# Patient Record
Sex: Male | Born: 1935 | Race: Black or African American | Hispanic: No | Smoking: Never smoker
Health system: Southern US, Community
[De-identification: ages and names within clinical notes are randomized; demographics above are authoritative.]

## PROBLEM LIST (undated history)

## (undated) DIAGNOSIS — D649 Anemia, unspecified: Secondary | ICD-10-CM

## (undated) DIAGNOSIS — I10 Essential (primary) hypertension: Secondary | ICD-10-CM

## (undated) DIAGNOSIS — N189 Chronic kidney disease, unspecified: Secondary | ICD-10-CM

## (undated) DIAGNOSIS — G4730 Sleep apnea, unspecified: Secondary | ICD-10-CM

## (undated) DIAGNOSIS — E119 Type 2 diabetes mellitus without complications: Secondary | ICD-10-CM

## (undated) DIAGNOSIS — F0390 Unspecified dementia without behavioral disturbance: Secondary | ICD-10-CM

## (undated) DIAGNOSIS — E785 Hyperlipidemia, unspecified: Secondary | ICD-10-CM

## (undated) DIAGNOSIS — J449 Chronic obstructive pulmonary disease, unspecified: Secondary | ICD-10-CM

---

## 2017-11-30 ENCOUNTER — Other Ambulatory Visit: Payer: Self-pay | Admitting: Internal Medicine

## 2017-11-30 ENCOUNTER — Ambulatory Visit
Admission: RE | Admit: 2017-11-30 | Discharge: 2017-11-30 | Disposition: A | Discharge status: Home / Self Care | Payer: Self-pay | Source: Ambulatory Visit | Attending: Internal Medicine | Admitting: Internal Medicine

## 2017-11-30 DIAGNOSIS — R059 Cough, unspecified: Secondary | ICD-10-CM

## 2017-11-30 DIAGNOSIS — R05 Cough: Secondary | ICD-10-CM

## 2017-12-06 ENCOUNTER — Encounter (HOSPITAL_BASED_OUTPATIENT_CLINIC_OR_DEPARTMENT_OTHER): Payer: Self-pay

## 2017-12-06 DIAGNOSIS — R29818 Other symptoms and signs involving the nervous system: Secondary | ICD-10-CM

## 2017-12-20 ENCOUNTER — Encounter (HOSPITAL_BASED_OUTPATIENT_CLINIC_OR_DEPARTMENT_OTHER): Payer: No Typology Code available for payment source

## 2018-01-17 ENCOUNTER — Encounter (HOSPITAL_BASED_OUTPATIENT_CLINIC_OR_DEPARTMENT_OTHER): Payer: No Typology Code available for payment source

## 2019-09-17 ENCOUNTER — Other Ambulatory Visit: Payer: Self-pay | Admitting: Internal Medicine

## 2019-09-17 ENCOUNTER — Ambulatory Visit
Admission: RE | Admit: 2019-09-17 | Discharge: 2019-09-17 | Disposition: A | Discharge status: Home / Self Care | Payer: No Typology Code available for payment source | Source: Ambulatory Visit | Attending: Internal Medicine | Admitting: Internal Medicine

## 2019-09-17 DIAGNOSIS — R52 Pain, unspecified: Secondary | ICD-10-CM

## 2019-09-18 ENCOUNTER — Encounter (HOSPITAL_BASED_OUTPATIENT_CLINIC_OR_DEPARTMENT_OTHER): Payer: Self-pay

## 2019-09-18 ENCOUNTER — Emergency Department (HOSPITAL_BASED_OUTPATIENT_CLINIC_OR_DEPARTMENT_OTHER): Payer: Medicare (Managed Care)

## 2019-09-18 ENCOUNTER — Emergency Department (HOSPITAL_BASED_OUTPATIENT_CLINIC_OR_DEPARTMENT_OTHER)
Admission: EM | Admit: 2019-09-18 | Discharge: 2019-09-18 | Disposition: A | Discharge status: Home / Self Care | Payer: Medicare (Managed Care) | Attending: Emergency Medicine | Admitting: Emergency Medicine

## 2019-09-18 ENCOUNTER — Other Ambulatory Visit: Payer: Self-pay

## 2019-09-18 DIAGNOSIS — S8991XA Unspecified injury of right lower leg, initial encounter: Secondary | ICD-10-CM | POA: Insufficient documentation

## 2019-09-18 DIAGNOSIS — N189 Chronic kidney disease, unspecified: Secondary | ICD-10-CM | POA: Insufficient documentation

## 2019-09-18 DIAGNOSIS — Y999 Unspecified external cause status: Secondary | ICD-10-CM | POA: Diagnosis not present

## 2019-09-18 DIAGNOSIS — I129 Hypertensive chronic kidney disease with stage 1 through stage 4 chronic kidney disease, or unspecified chronic kidney disease: Secondary | ICD-10-CM | POA: Diagnosis not present

## 2019-09-18 DIAGNOSIS — F0390 Unspecified dementia without behavioral disturbance: Secondary | ICD-10-CM | POA: Insufficient documentation

## 2019-09-18 DIAGNOSIS — Z7982 Long term (current) use of aspirin: Secondary | ICD-10-CM | POA: Diagnosis not present

## 2019-09-18 DIAGNOSIS — J449 Chronic obstructive pulmonary disease, unspecified: Secondary | ICD-10-CM | POA: Insufficient documentation

## 2019-09-18 DIAGNOSIS — Y929 Unspecified place or not applicable: Secondary | ICD-10-CM | POA: Insufficient documentation

## 2019-09-18 DIAGNOSIS — W19XXXA Unspecified fall, initial encounter: Secondary | ICD-10-CM | POA: Diagnosis not present

## 2019-09-18 DIAGNOSIS — Y939 Activity, unspecified: Secondary | ICD-10-CM | POA: Insufficient documentation

## 2019-09-18 DIAGNOSIS — Z79899 Other long term (current) drug therapy: Secondary | ICD-10-CM | POA: Insufficient documentation

## 2019-09-18 HISTORY — DX: Chronic kidney disease, unspecified: N18.9

## 2019-09-18 HISTORY — DX: Unspecified dementia, unspecified severity, without behavioral disturbance, psychotic disturbance, mood disturbance, and anxiety: F03.90

## 2019-09-18 HISTORY — DX: Essential (primary) hypertension: I10

## 2019-09-18 HISTORY — DX: Chronic obstructive pulmonary disease, unspecified: J44.9

## 2019-09-18 NOTE — ED Provider Notes (Signed)
[facility] EMERGENCY DEPARTMENT Provider Note   CSN: [id number] Arrival date & time: 09/18/19  1457     History   Chief Complaint Chief Complaint  Patient presents with  . Knee Pain    HPI Jason Vasquez is a 85 y.o. male  w PMHx HTN, CKD, COPD, dementia, presenting to the ED with complaint of persistent right knee pain after mechanical fall that occurred on 09/15/2019.  He reportedly slipped on some the outside and fell forward onto his knees and struck his head.  He was seen at [facility] ED with CT imaging of the head and C-spine as well as plain films of the right knee which were all negative.  He was discharged with symptomatic management.  His family states he was at physical therapy for his knee pain, however is concerned he may have a patella dislocation.  Patient states he has been having persistent pain to the right knee.  Pain is worse with fully extending his knee and he is unable to do so due to pain.  Has been treating his symptoms with Biofreeze, Tylenol without much relief.  His family states he is remained at his mental baseline since the fall, he is not on anticoagulation.     The history is provided by the patient, medical records and a relative.    Past Medical History:  Diagnosis Date  . Hypertension     There are no active problems to display for this patient.   History reviewed. No pertinent surgical history.      Home Medications    Prior to Admission medications   Medication Sig Start Date End Date Taking? Authorizing Provider  atorvastatin (LIPITOR) 10 MG tablet Take by mouth. 10/16/16  Yes [provider]  ezetimibe (ZETIA) 10 MG tablet Take by mouth. 07/06/17  Yes [provider]  omeprazole (PRILOSEC) 40 MG capsule Take by mouth. 07/06/17  Yes [provider]  acetaminophen (TYLENOL) 650 MG CR tablet Take by mouth.    [provider]  aspirin EC 81 MG tablet Take by mouth.    [provider]    Family History No family history on file.  Social History Social History   Tobacco Use  . Smoking status: Never Smoker  Substance Use Topics  . Alcohol use: Never    Frequency: Never  . Drug use: Never     Allergies   Patient has no allergy information on record.   Review of Systems Review of Systems  All other systems reviewed and are negative.    Physical Exam Updated Vital Signs BP 133/87 (BP Location: Right Arm)   Pulse 87   Temp 98.3 F (36.8 C) (Oral)   Resp 17   Ht 5\' 11"  (1.803 m)   Wt 102.1 kg   SpO2 99%   BMI 31.38 kg/m   Physical Exam Vitals signs and nursing note reviewed.  Constitutional:      General: He is not in acute distress.    Appearance: He is well-developed.  HENT:     Head: Normocephalic and atraumatic.  Eyes:     Conjunctiva/sclera: Conjunctivae normal.  Cardiovascular:     Rate and Rhythm: Normal rate.  Pulmonary:     Effort: Pulmonary effort is normal.  Abdominal:     Palpations: Abdomen is soft.  Musculoskeletal:     Comments: Patient holding right knee in a flexed position, unwilling to fully extend the knee due to pain.  His knee begins  to extend, there appears to be some mild deformity just superior to the patella. Patient has generalized tenderness to the knee with associated swelling and mild warmth, no redness.  Skin:    General: Skin is warm.  Neurological:     Mental Status: He is alert.  Psychiatric:        Behavior: Behavior normal.      ED Treatments / Results  Labs (all labs ordered are listed, but only abnormal results are displayed) Labs Reviewed - No data to display  EKG None  Radiology Dg Knee 4 Views W/patella Right  Result Date: 09/18/2019 CLINICAL DATA:  Acute RIGHT knee pain following fall today. Initial encounter. EXAM: RIGHT KNEE - COMPLETE 4+ VIEW COMPARISON:  09/15/2019 FINDINGS: No acute fracture, subluxation or dislocation. No definite knee effusion is noted. Probable soft tissue  swelling noted. Mild tricompartmental joint space narrowing noted. IMPRESSION: Probable soft tissue swelling without acute bony abnormality. Electronically Signed   By: Margarette Canada M.D.   On: 09/18/2019 16:50    Procedures Procedures (including critical care time)  Medications Ordered in ED Medications - No data to display   Initial Impression / Assessment and Plan / ED Course  I have reviewed the triage vital signs and the nursing notes.  Pertinent labs & imaging results that were available during my care of the patient were reviewed by me and considered in my medical decision making (see chart for details).        Patient presenting with persistent right knee pain after mechanical fall on 11 2.  He was evaluated at [facility] ED with negative imaging of the knee.  He has had persistent pain with swelling.  Pain is worse with fully extending the knee.  He was at his physical therapist today who sent him here for further evaluation with concern for possible patella dislocation.  No fevers. On exam, knee is edematous and diffusely tender.  Patient is having more pain with extension of the knee.  There is a mild deformity just superior to the patella.  Neurovascularly intact.  No redness.Low suspicion for septic or gouty arthritis given acute onset of sx following injury. Xray here is neg for acute fracture or dislocation.  Patient evaluated by Dr. Sedonia Small.  Given the mild deformity to superior to patella, concern for possible quadriceps tendon rupture versus internal derangement.  Will place in knee immobilizer brace.  Patient has a walker at home, instructed to use this for ambulation.  He is provided outpatient orthopedic referral for further management.  Patient's family is agreeable to plan, patient is a for discharge.  Discussed results, findings, treatment and follow up. Patient advised of return precautions. Patient verbalized understanding and agreed with plan.  Final Clinical  Impressions(s) / ED Diagnoses   Final diagnoses:  Injury of right knee, initial encounter    ED Discharge Orders    None       , [place] N, PA-C 09/18/19 1730    Maudie Flakes, MD 09/18/19 2321

## 2019-09-18 NOTE — ED Triage Notes (Signed)
Pt presents with pain to R knee after fall- seen at [facility] for same on 11/2. Pt in wheelchair- NAD.

## 2019-09-18 NOTE — Discharge Instructions (Addendum)
Please read instructions below. Apply ice to your knee for 20 minutes at a time. You can take Tylenol every 6 hours as needed for pain. Schedule an appointment with the orthopedic specialist for further evaluation of your injury. Return to the ER for new or concerning symptoms.

## 2019-09-18 NOTE — ED Notes (Signed)
ED Provider at bedside. 

## 2019-09-18 NOTE — ED Notes (Signed)
Pt back from x-ray.

## 2019-09-23 ENCOUNTER — Inpatient Hospital Stay (HOSPITAL_COMMUNITY)
Admission: AD | Admit: 2019-09-23 | Discharge: 2019-10-04 | DRG: 500 | Disposition: A | Discharge status: Skilled Nursing Facility | Payer: Medicare (Managed Care) | Source: Ambulatory Visit | Attending: Internal Medicine | Admitting: Internal Medicine

## 2019-09-23 ENCOUNTER — Inpatient Hospital Stay (HOSPITAL_COMMUNITY): Payer: Medicare (Managed Care)

## 2019-09-23 ENCOUNTER — Encounter (HOSPITAL_COMMUNITY): Payer: Self-pay

## 2019-09-23 DIAGNOSIS — N1831 Chronic kidney disease, stage 3a: Secondary | ICD-10-CM | POA: Diagnosis present

## 2019-09-23 DIAGNOSIS — S76111A Strain of right quadriceps muscle, fascia and tendon, initial encounter: Secondary | ICD-10-CM | POA: Diagnosis present

## 2019-09-23 DIAGNOSIS — L8990 Pressure ulcer of unspecified site, unspecified stage: Secondary | ICD-10-CM | POA: Insufficient documentation

## 2019-09-23 DIAGNOSIS — R410 Disorientation, unspecified: Secondary | ICD-10-CM

## 2019-09-23 DIAGNOSIS — E785 Hyperlipidemia, unspecified: Secondary | ICD-10-CM | POA: Diagnosis present

## 2019-09-23 DIAGNOSIS — Z79899 Other long term (current) drug therapy: Secondary | ICD-10-CM

## 2019-09-23 DIAGNOSIS — G309 Alzheimer's disease, unspecified: Secondary | ICD-10-CM | POA: Diagnosis present

## 2019-09-23 DIAGNOSIS — I129 Hypertensive chronic kidney disease with stage 1 through stage 4 chronic kidney disease, or unspecified chronic kidney disease: Secondary | ICD-10-CM | POA: Diagnosis present

## 2019-09-23 DIAGNOSIS — N400 Enlarged prostate without lower urinary tract symptoms: Secondary | ICD-10-CM

## 2019-09-23 DIAGNOSIS — N179 Acute kidney failure, unspecified: Secondary | ICD-10-CM | POA: Diagnosis present

## 2019-09-23 DIAGNOSIS — Z781 Physical restraint status: Secondary | ICD-10-CM | POA: Diagnosis not present

## 2019-09-23 DIAGNOSIS — E1169 Type 2 diabetes mellitus with other specified complication: Secondary | ICD-10-CM | POA: Diagnosis present

## 2019-09-23 DIAGNOSIS — G9341 Metabolic encephalopathy: Secondary | ICD-10-CM | POA: Diagnosis present

## 2019-09-23 DIAGNOSIS — E119 Type 2 diabetes mellitus without complications: Secondary | ICD-10-CM | POA: Diagnosis not present

## 2019-09-23 DIAGNOSIS — J1289 Other viral pneumonia: Secondary | ICD-10-CM | POA: Diagnosis present

## 2019-09-23 DIAGNOSIS — Z6830 Body mass index (BMI) 30.0-30.9, adult: Secondary | ICD-10-CM

## 2019-09-23 DIAGNOSIS — Z87891 Personal history of nicotine dependence: Secondary | ICD-10-CM | POA: Diagnosis not present

## 2019-09-23 DIAGNOSIS — E1122 Type 2 diabetes mellitus with diabetic chronic kidney disease: Secondary | ICD-10-CM | POA: Diagnosis present

## 2019-09-23 DIAGNOSIS — K219 Gastro-esophageal reflux disease without esophagitis: Secondary | ICD-10-CM | POA: Diagnosis present

## 2019-09-23 DIAGNOSIS — M7989 Other specified soft tissue disorders: Secondary | ICD-10-CM | POA: Diagnosis not present

## 2019-09-23 DIAGNOSIS — F05 Delirium due to known physiological condition: Secondary | ICD-10-CM | POA: Diagnosis present

## 2019-09-23 DIAGNOSIS — U071 COVID-19: Secondary | ICD-10-CM | POA: Diagnosis present

## 2019-09-23 DIAGNOSIS — L89322 Pressure ulcer of left buttock, stage 2: Secondary | ICD-10-CM | POA: Diagnosis present

## 2019-09-23 DIAGNOSIS — I160 Hypertensive urgency: Secondary | ICD-10-CM | POA: Diagnosis not present

## 2019-09-23 DIAGNOSIS — E669 Obesity, unspecified: Secondary | ICD-10-CM | POA: Diagnosis present

## 2019-09-23 DIAGNOSIS — W010XXA Fall on same level from slipping, tripping and stumbling without subsequent striking against object, initial encounter: Secondary | ICD-10-CM | POA: Diagnosis present

## 2019-09-23 DIAGNOSIS — N183 Chronic kidney disease, stage 3 unspecified: Secondary | ICD-10-CM

## 2019-09-23 DIAGNOSIS — Z419 Encounter for procedure for purposes other than remedying health state, unspecified: Secondary | ICD-10-CM

## 2019-09-23 DIAGNOSIS — J9601 Acute respiratory failure with hypoxia: Secondary | ICD-10-CM | POA: Diagnosis present

## 2019-09-23 DIAGNOSIS — F0280 Dementia in other diseases classified elsewhere without behavioral disturbance: Secondary | ICD-10-CM | POA: Diagnosis present

## 2019-09-23 LAB — BASIC METABOLIC PANEL
Anion gap: 9 (ref 5–15)
BUN: 34 mg/dL — ABNORMAL HIGH (ref 8–23)
CO2: 29 mmol/L (ref 22–32)
Calcium: 8.7 mg/dL — ABNORMAL LOW (ref 8.9–10.3)
Chloride: 98 mmol/L (ref 98–111)
Creatinine, Ser: 1.82 mg/dL — ABNORMAL HIGH (ref 0.61–1.24)
GFR calc Af Amer: 38 mL/min — ABNORMAL LOW (ref >60)
GFR calc non Af Amer: 32 mL/min — ABNORMAL LOW (ref >60)
Glucose, Bld: 130 mg/dL — ABNORMAL HIGH (ref 70–99)
Potassium: 3.2 mmol/L — ABNORMAL LOW (ref 3.5–5.1)
Sodium: 136 mmol/L (ref 135–145)

## 2019-09-23 LAB — CBC WITH DIFFERENTIAL/PLATELET
Abs Immature Granulocytes: 0.01 10*3/uL (ref 0.00–0.07)
Basophils Absolute: 0 10*3/uL (ref 0.0–0.1)
Basophils Relative: 0 %
Eosinophils Absolute: 0.1 10*3/uL (ref 0.0–0.5)
Eosinophils Relative: 4 %
HCT: 36 % — ABNORMAL LOW (ref 39.0–52.0)
Hemoglobin: 12 g/dL — ABNORMAL LOW (ref 13.0–17.0)
Immature Granulocytes: 0 %
Lymphocytes Relative: 26 %
Lymphs Abs: 1 10*3/uL (ref 0.7–4.0)
MCH: 33.8 pg (ref 26.0–34.0)
MCHC: 33.3 g/dL (ref 30.0–36.0)
MCV: 101.4 fL — ABNORMAL HIGH (ref 80.0–100.0)
Monocytes Absolute: 0.7 10*3/uL (ref 0.1–1.0)
Monocytes Relative: 19 %
Neutro Abs: 2.1 10*3/uL (ref 1.7–7.7)
Neutrophils Relative %: 51 %
Platelets: 292 10*3/uL (ref 150–400)
RBC: 3.55 MIL/uL — ABNORMAL LOW (ref 4.22–5.81)
RDW: 12.3 % (ref 11.5–15.5)
WBC: 4 10*3/uL (ref 4.0–10.5)
nRBC: 0 % (ref 0.0–0.2)

## 2019-09-23 LAB — PROTIME-INR
INR: 1 (ref 0.8–1.2)
Prothrombin Time: 13.5 seconds (ref 11.4–15.2)

## 2019-09-23 MED ORDER — HYDROCODONE-ACETAMINOPHEN 5-325 MG PO TABS
1.0000 | ORAL_TABLET | ORAL | Status: DC | PRN
  Administered 2019-09-24: 1 via ORAL
  Administered 2019-09-25 – 2019-10-04 (×6): 2 via ORAL
  Filled 2019-09-23 (×2): qty 1
  Filled 2019-09-23 (×6): qty 2

## 2019-09-23 MED ORDER — SODIUM CHLORIDE 0.9% FLUSH
10.0000 mL | Freq: Two times a day (BID) | INTRAVENOUS | Status: DC
  Administered 2019-09-24 – 2019-10-02 (×17): 10 mL
  Administered 2019-10-03: 3 mL

## 2019-09-23 MED ORDER — SODIUM CHLORIDE 0.9 % IV SOLN
INTRAVENOUS | Status: DC
  Administered 2019-09-24: 06:00:00 via INTRAVENOUS

## 2019-09-23 MED ORDER — METHOCARBAMOL 500 MG PO TABS
500.0000 mg | ORAL_TABLET | Freq: Four times a day (QID) | ORAL | Status: DC | PRN
  Administered 2019-09-24 – 2019-09-30 (×3): 500 mg via ORAL
  Filled 2019-09-23 (×3): qty 1

## 2019-09-23 MED ORDER — ENOXAPARIN SODIUM 30 MG/0.3ML ~~LOC~~ SOLN: 30.0000 mg | SUBCUTANEOUS | Status: DC

## 2019-09-23 MED ORDER — SODIUM CHLORIDE 0.9% FLUSH: 10.0000 mL | INTRAVENOUS | Status: DC | PRN

## 2019-09-23 NOTE — H&P (Signed)
Anticipated LOS equal to or greater than 2 midnights due to - Age 83 and older with one or more of the following:  - Obesity  - Expected need for hospital services (PT, OT, Nursing) required for safe  discharge  - Anticipated need for postoperative skilled nursing care or inpatient rehab  - Active co-morbidities: COPD OR   - Unanticipated findings during/Post Surgery: None  - Patient is a high risk of re-admission due to: None    Jason Vasquez is an 85 y.o. male.    Chief Complaint: right knee pain s/p fall  HPI: Pt is a 85 y.o. male complaining of right knee pain due to recent fall. Pain had continually increased since the beginning. X-rays in the clinic show patella alta signifying possible quad tendon rupture .  Various options are discussed with the patient. Risks, benefits and expectations were discussed with the patient. Patient understand the risks, benefits and expectations and wishes to proceed with surgery.   PCP:  Angelica Pou, MD  D/C Plans: Home  PMH: Past Medical History:  Diagnosis Date  . CKD (chronic kidney disease)   . COPD (chronic obstructive pulmonary disease) ([place])   . Dementia ([place])   . Hypertension     PSH: No past surgical history on file.  Social History:  reports that he has never smoked. He does not have any smokeless tobacco history on file. He reports that he does not drink alcohol or use drugs.  Allergies:  Not on File  Medications: Current Facility-Administered Medications  Medication Dose Route Frequency Provider Last Rate Last Dose  . 0.9 %  sodium chloride infusion   Intravenous Continuous Cline Crock, PA-C      . enoxaparin (LOVENOX) injection 30 mg  30 mg Subcutaneous Q24H Cline Crock, PA-C      . HYDROcodone-acetaminophen (NORCO/VICODIN) 5-325 MG per tablet 1-2 tablet  1-2 tablet Oral Q4H PRN Cline Crock, PA-C      . methocarbamol (ROBAXIN) tablet 500 mg  500 mg Oral Q6H PRN Cline Crock,  PA-C        No results found for this or any previous visit (from the past 48 hour(s)). No results found.  ROS: Pain with rom of the right lower extremity  Physical Exam: Alert and oriented 85 y.o. male in no acute distress Cranial nerves 2-12 intact Cervical spine: full rom with no tenderness, nv intact distally Chest: active breath sounds bilaterally, no wheeze rhonchi or rales Heart: regular rate and rhythm, no murmur Abd: non tender non distended with active bowel sounds Hip is stable with rom  Right knee with limited rom and extensor lag Palpable defect to right distal quad tendon Antalgic gait  Assessment/Plan Assessment: right quad tendon rupture  Plan:  Patient will undergo a right quad tendon repair by Dr. Veverly Fells at [facility]. Risks benefits and expectations were discussed with the patient. Patient understand risks, benefits and expectations and wishes to proceed. Preoperative templating of the joint replacement has been completed, documented, and submitted to the Operating Room personnel in order to optimize intra-operative equipment management.   Merla Riches PA-C, MPAS [facility] is now [employer] [address], [place], [place], [place] [address] Phone: [phone] [email or web address] Facebook  [employer]

## 2019-09-24 ENCOUNTER — Other Ambulatory Visit: Payer: Self-pay

## 2019-09-24 ENCOUNTER — Inpatient Hospital Stay (HOSPITAL_COMMUNITY): Payer: Medicare (Managed Care) | Admitting: Certified Registered Nurse Anesthetist

## 2019-09-24 ENCOUNTER — Encounter (HOSPITAL_COMMUNITY)
Admission: AD | Disposition: A | Discharge status: Skilled Nursing Facility | Payer: Self-pay | Source: Ambulatory Visit | Attending: Internal Medicine

## 2019-09-24 ENCOUNTER — Inpatient Hospital Stay (HOSPITAL_COMMUNITY): Payer: Medicare (Managed Care)

## 2019-09-24 DIAGNOSIS — S76111A Strain of right quadriceps muscle, fascia and tendon, initial encounter: Secondary | ICD-10-CM | POA: Diagnosis present

## 2019-09-24 DIAGNOSIS — M7989 Other specified soft tissue disorders: Secondary | ICD-10-CM

## 2019-09-24 HISTORY — PX: QUADRICEPS TENDON REPAIR: SHX756

## 2019-09-24 LAB — BASIC METABOLIC PANEL
Anion gap: 9 (ref 5–15)
BUN: 26 mg/dL — ABNORMAL HIGH (ref 8–23)
CO2: 26 mmol/L (ref 22–32)
Calcium: 8.7 mg/dL — ABNORMAL LOW (ref 8.9–10.3)
Chloride: 100 mmol/L (ref 98–111)
Creatinine, Ser: 1.37 mg/dL — ABNORMAL HIGH (ref 0.61–1.24)
GFR calc Af Amer: 53 mL/min — ABNORMAL LOW (ref >60)
GFR calc non Af Amer: 46 mL/min — ABNORMAL LOW (ref >60)
Glucose, Bld: 108 mg/dL — ABNORMAL HIGH (ref 70–99)
Potassium: 3.1 mmol/L — ABNORMAL LOW (ref 3.5–5.1)
Sodium: 135 mmol/L (ref 135–145)

## 2019-09-24 LAB — SURGICAL PCR SCREEN
MRSA, PCR: NEGATIVE
Staphylococcus aureus: NEGATIVE

## 2019-09-24 LAB — SARS CORONAVIRUS 2 (TAT 6-24 HRS): SARS Coronavirus 2: POSITIVE — AB

## 2019-09-24 SURGERY — REPAIR, TENDON, QUADRICEPS
Anesthesia: Spinal | Site: Knee | Laterality: Right

## 2019-09-24 MED ORDER — DILTIAZEM HCL ER COATED BEADS 420 MG PO TB24: 420.0000 mg | ORAL_TABLET | Freq: Every day | ORAL | Status: DC

## 2019-09-24 MED ORDER — FLUTICASONE PROPIONATE 50 MCG/ACT NA SUSP
1.0000 | Freq: Every day | NASAL | Status: DC
  Administered 2019-09-25 – 2019-10-03 (×9): 1 via NASAL
  Filled 2019-09-24: qty 16

## 2019-09-24 MED ORDER — PHENYLEPHRINE 40 MCG/ML (10ML) SYRINGE FOR IV PUSH (FOR BLOOD PRESSURE SUPPORT)
PREFILLED_SYRINGE | INTRAVENOUS | Status: DC | PRN
  Administered 2019-09-24 (×3): 120 ug via INTRAVENOUS

## 2019-09-24 MED ORDER — ONDANSETRON HCL 4 MG/2ML IJ SOLN
INTRAMUSCULAR | Status: DC | PRN
  Administered 2019-09-24: 4 mg via INTRAVENOUS

## 2019-09-24 MED ORDER — ROCURONIUM BROMIDE 50 MG/5ML IV SOSY
PREFILLED_SYRINGE | INTRAVENOUS | Status: DC | PRN
  Administered 2019-09-24: 50 mg via INTRAVENOUS

## 2019-09-24 MED ORDER — DOCUSATE SODIUM 100 MG PO CAPS
100.0000 mg | ORAL_CAPSULE | Freq: Two times a day (BID) | ORAL | Status: DC
  Administered 2019-09-24 – 2019-10-03 (×18): 100 mg via ORAL
  Filled 2019-09-24 (×19): qty 1

## 2019-09-24 MED ORDER — PANTOPRAZOLE SODIUM 40 MG PO TBEC
80.0000 mg | DELAYED_RELEASE_TABLET | Freq: Every day | ORAL | Status: DC
  Administered 2019-09-25 – 2019-10-01 (×7): 80 mg via ORAL
  Filled 2019-09-24 (×7): qty 2

## 2019-09-24 MED ORDER — LORAZEPAM 2 MG/ML IJ SOLN
0.5000 mg | Freq: Four times a day (QID) | INTRAMUSCULAR | Status: DC | PRN
  Administered 2019-09-24 – 2019-09-27 (×5): 0.5 mg via INTRAVENOUS
  Filled 2019-09-24 (×5): qty 1

## 2019-09-24 MED ORDER — MORPHINE SULFATE (PF) 4 MG/ML IV SOLN
0.5000 mg | INTRAVENOUS | Status: DC | PRN
  Administered 2019-09-24: 1 mg via INTRAVENOUS
  Filled 2019-09-24: qty 1

## 2019-09-24 MED ORDER — CITALOPRAM HYDROBROMIDE 10 MG PO TABS
10.0000 mg | ORAL_TABLET | Freq: Every day | ORAL | Status: DC
  Administered 2019-09-25 – 2019-10-03 (×9): 10 mg via ORAL
  Filled 2019-09-24 (×10): qty 1

## 2019-09-24 MED ORDER — ONDANSETRON HCL 4 MG PO TABS: 4.0000 mg | ORAL_TABLET | Freq: Four times a day (QID) | ORAL | Status: DC | PRN

## 2019-09-24 MED ORDER — POTASSIUM CHLORIDE 20 MEQ PO PACK
40.0000 meq | PACK | Freq: Once | ORAL | Status: AC | PRN
  Administered 2019-09-24: 40 meq via ORAL
  Filled 2019-09-24: qty 2

## 2019-09-24 MED ORDER — FENTANYL CITRATE (PF) 100 MCG/2ML IJ SOLN: 25.0000 ug | INTRAMUSCULAR | Status: DC | PRN

## 2019-09-24 MED ORDER — VITAMIN D3 1.25 MG (50000 UT) PO CAPS: 1.2500 mg | ORAL_CAPSULE | ORAL | Status: DC

## 2019-09-24 MED ORDER — TRAZODONE HCL 50 MG PO TABS
25.0000 mg | ORAL_TABLET | Freq: Every day | ORAL | Status: DC
  Administered 2019-09-24 – 2019-10-03 (×10): 25 mg via ORAL
  Filled 2019-09-24 (×10): qty 1

## 2019-09-24 MED ORDER — SUGAMMADEX SODIUM 500 MG/5ML IV SOLN
INTRAVENOUS | Status: DC | PRN
  Administered 2019-09-24: 200 mg via INTRAVENOUS

## 2019-09-24 MED ORDER — IRBESARTAN 300 MG PO TABS
300.0000 mg | ORAL_TABLET | Freq: Every day | ORAL | Status: DC
  Administered 2019-09-24 – 2019-10-03 (×10): 300 mg via ORAL
  Filled 2019-09-24 (×12): qty 1

## 2019-09-24 MED ORDER — CLINDAMYCIN PHOSPHATE 900 MG/50ML IV SOLN
INTRAVENOUS | Status: DC | PRN
  Administered 2019-09-24: 900 mg via INTRAVENOUS

## 2019-09-24 MED ORDER — HYDRALAZINE HCL 10 MG PO TABS
20.0000 mg | ORAL_TABLET | Freq: Once | ORAL | Status: AC
  Administered 2019-09-24: 20 mg via ORAL
  Filled 2019-09-24: qty 2

## 2019-09-24 MED ORDER — PHENYLEPHRINE HCL-NACL 10-0.9 MG/250ML-% IV SOLN
INTRAVENOUS | Status: AC
  Filled 2019-09-24: qty 250

## 2019-09-24 MED ORDER — DILTIAZEM HCL ER COATED BEADS 180 MG PO CP24
420.0000 mg | ORAL_CAPSULE | Freq: Every day | ORAL | Status: DC
  Administered 2019-09-24 – 2019-10-04 (×11): 420 mg via ORAL
  Filled 2019-09-24 (×2): qty 2
  Filled 2019-09-24: qty 1
  Filled 2019-09-24 (×2): qty 2
  Filled 2019-09-24: qty 1
  Filled 2019-09-24: qty 2
  Filled 2019-09-24: qty 1
  Filled 2019-09-24: qty 2
  Filled 2019-09-24: qty 1
  Filled 2019-09-24 (×2): qty 2

## 2019-09-24 MED ORDER — ACETAMINOPHEN 325 MG PO TABS: 325.0000 mg | ORAL_TABLET | Freq: Four times a day (QID) | ORAL | Status: DC | PRN

## 2019-09-24 MED ORDER — ACETAMINOPHEN ER 650 MG PO TBCR: 650.0000 mg | EXTENDED_RELEASE_TABLET | Freq: Three times a day (TID) | ORAL | Status: DC | PRN

## 2019-09-24 MED ORDER — MENTHOL 3 MG MT LOZG
1.0000 | LOZENGE | OROMUCOSAL | Status: DC | PRN
  Filled 2019-09-24: qty 9

## 2019-09-24 MED ORDER — SODIUM CHLORIDE 0.9 % IV SOLN
INTRAVENOUS | Status: DC
  Administered 2019-09-24: 21:00:00 via INTRAVENOUS

## 2019-09-24 MED ORDER — CHLORTHALIDONE 50 MG PO TABS
50.0000 mg | ORAL_TABLET | Freq: Every day | ORAL | Status: DC
  Administered 2019-09-25: 50 mg via ORAL
  Filled 2019-09-24: qty 1

## 2019-09-24 MED ORDER — POLYETHYLENE GLYCOL 3350 17 G PO PACK: 17.0000 g | PACK | Freq: Every day | ORAL | Status: DC | PRN

## 2019-09-24 MED ORDER — ATORVASTATIN CALCIUM 10 MG PO TABS
10.0000 mg | ORAL_TABLET | Freq: Every day | ORAL | Status: DC
  Administered 2019-09-25 – 2019-10-03 (×8): 10 mg via ORAL
  Filled 2019-09-24 (×9): qty 1

## 2019-09-24 MED ORDER — DONEPEZIL HCL 10 MG PO TABS
10.0000 mg | ORAL_TABLET | Freq: Every day | ORAL | Status: DC
  Administered 2019-09-25 – 2019-10-04 (×10): 10 mg via ORAL
  Filled 2019-09-24 (×10): qty 1

## 2019-09-24 MED ORDER — FENTANYL CITRATE (PF) 100 MCG/2ML IJ SOLN
INTRAMUSCULAR | Status: AC
  Filled 2019-09-24: qty 2

## 2019-09-24 MED ORDER — PROPOFOL 10 MG/ML IV BOLUS
INTRAVENOUS | Status: DC | PRN
  Administered 2019-09-24 (×2): 10 mg via INTRAVENOUS
  Administered 2019-09-24: 40 mg via INTRAVENOUS
  Administered 2019-09-24: 10 mg via INTRAVENOUS

## 2019-09-24 MED ORDER — LACTATED RINGERS IV SOLN
INTRAVENOUS | Status: DC | PRN
  Administered 2019-09-24: 16:00:00 via INTRAVENOUS

## 2019-09-24 MED ORDER — DILTIAZEM HCL ER COATED BEADS 240 MG PO CP24: 420.0000 mg | ORAL_CAPSULE | Freq: Every day | ORAL | Status: DC

## 2019-09-24 MED ORDER — PROPOFOL 10 MG/ML IV BOLUS
INTRAVENOUS | Status: AC
  Filled 2019-09-24: qty 40

## 2019-09-24 MED ORDER — SODIUM CHLORIDE 0.9 % IR SOLN
Status: DC | PRN
  Administered 2019-09-24: 1000 mL

## 2019-09-24 MED ORDER — ACETAMINOPHEN 500 MG PO TABS: 500.0000 mg | ORAL_TABLET | Freq: Every day | ORAL | Status: DC | PRN

## 2019-09-24 MED ORDER — HYDROCODONE-ACETAMINOPHEN 5-325 MG PO TABS
1.0000 | ORAL_TABLET | ORAL | Status: DC | PRN
  Administered 2019-09-24 – 2019-10-03 (×5): 1 via ORAL
  Filled 2019-09-24 (×4): qty 1

## 2019-09-24 MED ORDER — FINASTERIDE 5 MG PO TABS
5.0000 mg | ORAL_TABLET | Freq: Every day | ORAL | Status: DC
  Administered 2019-09-24 – 2019-10-03 (×10): 5 mg via ORAL
  Filled 2019-09-24 (×10): qty 1

## 2019-09-24 MED ORDER — METOCLOPRAMIDE HCL 5 MG PO TABS: 5.0000 mg | ORAL_TABLET | Freq: Three times a day (TID) | ORAL | Status: DC | PRN

## 2019-09-24 MED ORDER — PHENYLEPHRINE HCL-NACL 10-0.9 MG/250ML-% IV SOLN
INTRAVENOUS | Status: DC | PRN
  Administered 2019-09-24: 50 ug/min via INTRAVENOUS

## 2019-09-24 MED ORDER — FAMOTIDINE 20 MG PO TABS
10.0000 mg | ORAL_TABLET | Freq: Two times a day (BID) | ORAL | Status: DC
  Administered 2019-09-24 – 2019-10-03 (×19): 10 mg via ORAL
  Filled 2019-09-24 (×19): qty 1

## 2019-09-24 MED ORDER — FENTANYL CITRATE (PF) 100 MCG/2ML IJ SOLN
INTRAMUSCULAR | Status: DC | PRN
  Administered 2019-09-24 (×5): 25 ug via INTRAVENOUS

## 2019-09-24 MED ORDER — SUGAMMADEX SODIUM 500 MG/5ML IV SOLN
INTRAVENOUS | Status: AC
  Filled 2019-09-24: qty 5

## 2019-09-24 MED ORDER — DEXAMETHASONE SODIUM PHOSPHATE 10 MG/ML IJ SOLN
INTRAMUSCULAR | Status: DC | PRN
  Administered 2019-09-24: 5 mg via INTRAVENOUS

## 2019-09-24 MED ORDER — DEXAMETHASONE SODIUM PHOSPHATE 10 MG/ML IJ SOLN
INTRAMUSCULAR | Status: AC
  Filled 2019-09-24: qty 1

## 2019-09-24 MED ORDER — ONDANSETRON HCL 4 MG/2ML IJ SOLN: 4.0000 mg | Freq: Once | INTRAMUSCULAR | Status: DC | PRN

## 2019-09-24 MED ORDER — METOCLOPRAMIDE HCL 5 MG/ML IJ SOLN: 5.0000 mg | Freq: Three times a day (TID) | INTRAMUSCULAR | Status: DC | PRN

## 2019-09-24 MED ORDER — ONDANSETRON HCL 4 MG/2ML IJ SOLN
4.0000 mg | Freq: Four times a day (QID) | INTRAMUSCULAR | Status: DC | PRN
  Administered 2019-09-25: 4 mg via INTRAVENOUS
  Filled 2019-09-24: qty 2

## 2019-09-24 MED ORDER — BUPIVACAINE-EPINEPHRINE (PF) 0.25% -1:200000 IJ SOLN
INTRAMUSCULAR | Status: DC | PRN
  Administered 2019-09-24: 30 mL via PERINEURAL

## 2019-09-24 MED ORDER — PHENOL 1.4 % MT LIQD: 1.0000 | OROMUCOSAL | Status: DC | PRN

## 2019-09-24 MED ORDER — CLINDAMYCIN PHOSPHATE 600 MG/50ML IV SOLN
600.0000 mg | Freq: Four times a day (QID) | INTRAVENOUS | Status: AC
  Administered 2019-09-24 – 2019-09-25 (×2): 600 mg via INTRAVENOUS
  Filled 2019-09-24 (×2): qty 50

## 2019-09-24 MED ORDER — FERROUS SULFATE 325 (65 FE) MG PO TABS
325.0000 mg | ORAL_TABLET | Freq: Three times a day (TID) | ORAL | Status: DC
  Administered 2019-09-25 – 2019-10-03 (×24): 325 mg via ORAL
  Filled 2019-09-24 (×25): qty 1

## 2019-09-24 MED ORDER — CLINDAMYCIN PHOSPHATE 900 MG/50ML IV SOLN
INTRAVENOUS | Status: AC
  Filled 2019-09-24: qty 50

## 2019-09-24 SURGICAL SUPPLY — 39 items
BAG ZIPLOCK 12X15 (MISCELLANEOUS) IMPLANT
BIT DRILL 1.6MX128 (BIT) ×2 IMPLANT
BIT DRILL 1.6MX128MM (BIT) ×1
BIT DRILL 2.0X128 (BIT) ×2 IMPLANT
BIT DRILL 2.0X128MM (BIT) ×1
BNDG ELASTIC 6X10 VLCR STRL LF (GAUZE/BANDAGES/DRESSINGS) ×3 IMPLANT
BNDG GAUZE ELAST 4 BULKY (GAUZE/BANDAGES/DRESSINGS) ×3 IMPLANT
COVER WAND RF STERILE (DRAPES) IMPLANT
CUFF TOURN SGL QUICK 34 (TOURNIQUET CUFF) ×2
CUFF TRNQT CYL 34X4.125X (TOURNIQUET CUFF) ×1 IMPLANT
DRAPE SHEET LG 3/4 BI-LAMINATE (DRAPES) ×3 IMPLANT
DRAPE U-SHAPE 47X51 STRL (DRAPES) ×3 IMPLANT
DRSG ADAPTIC 3X8 NADH LF (GAUZE/BANDAGES/DRESSINGS) ×3 IMPLANT
DRSG PAD ABDOMINAL 8X10 ST (GAUZE/BANDAGES/DRESSINGS) ×3 IMPLANT
DURAPREP 26ML APPLICATOR (WOUND CARE) ×3 IMPLANT
ELECT REM PT RETURN 15FT ADLT (MISCELLANEOUS) ×3 IMPLANT
GAUZE SPONGE 4X4 12PLY STRL (GAUZE/BANDAGES/DRESSINGS) ×3 IMPLANT
GLOVE SURG ORTHO 8.5 STRL (GLOVE) ×3 IMPLANT
GOWN STRL REUS W/TWL LRG LVL3 (GOWN DISPOSABLE) ×3 IMPLANT
KIT BASIN OR (CUSTOM PROCEDURE TRAY) ×3 IMPLANT
KIT TURNOVER KIT A (KITS) IMPLANT
MANIFOLD NEPTUNE II (INSTRUMENTS) ×3 IMPLANT
NEEDLE MAYO CATGUT SZ4 (NEEDLE) ×3 IMPLANT
PACK TOTAL JOINT (CUSTOM PROCEDURE TRAY) ×3 IMPLANT
PADDING CAST COTTON 6X4 STRL (CAST SUPPLIES) ×3 IMPLANT
PASSER SUT SWANSON 36MM LOOP (INSTRUMENTS) ×3 IMPLANT
PROTECTOR NERVE ULNAR (MISCELLANEOUS) ×3 IMPLANT
SPLINT PLASTER CAST XFAST 5X30 (CAST SUPPLIES) ×1 IMPLANT
SPLINT PLASTER XFAST SET 5X30 (CAST SUPPLIES) ×2
STAPLER VISISTAT 35W (STAPLE) ×3 IMPLANT
SUT FIBERWIRE #2 38 T-5 BLUE (SUTURE) ×15
SUT FIBERWIRE #5 38 CONV NDL (SUTURE) ×6
SUT MNCRL AB 4-0 PS2 18 (SUTURE) ×3 IMPLANT
SUT VIC AB 1 CT1 36 (SUTURE) ×6 IMPLANT
SUT VIC AB 2-0 CT1 27 (SUTURE) ×4
SUT VIC AB 2-0 CT1 TAPERPNT 27 (SUTURE) ×2 IMPLANT
SUTURE FIBERWR #2 38 T-5 BLUE (SUTURE) ×5 IMPLANT
SUTURE FIBERWR #5 38 CONV NDL (SUTURE) ×2 IMPLANT
TOWEL OR 17X26 10 PK STRL BLUE (TOWEL DISPOSABLE) ×6 IMPLANT

## 2019-09-24 NOTE — Transfer of Care (Signed)
Immediate Anesthesia Transfer of Care Note  Patient: Frankey Botting  Procedure(s) Performed: REPAIR RIGHT QUADRICEP TENDON (Right Knee)  Patient Location: room 1441  Anesthesia Type:General  Level of Consciousness: awake and patient cooperative  Airway & Oxygen Therapy: Patient Spontanous Breathing and Patient connected to nasal cannula oxygen  Post-op Assessment: Report given to RN and Post -op Vital signs reviewed and stable  Post vital signs: Reviewed and stable  Last Vitals:  Vitals Value Taken Time  BP 194/85 09/24/19 1852  Temp 36.5 C 09/24/19 1852  Pulse 85 09/24/19 1852  Resp 18 09/24/19 1852  SpO2 100 % 09/24/19 1852    Last Pain:  Vitals:   09/24/19 1852  TempSrc: Axillary  PainSc:       Patients Stated Pain Goal: 3 (26/20/35 5974)  Complications: No apparent anesthesia complications

## 2019-09-24 NOTE — Progress Notes (Addendum)
Pt becoming more confused and agitated. On coming RN made aware. Safety precautions implemented. New order placed for telemonitor sitter. Daughter Blain Pais updated on patients status. Would like to speak to MD in regards of surgery. Please call daughter at [phone].

## 2019-09-24 NOTE — Progress Notes (Signed)
Dr. Esmond Plants notified of the patient's POSITIVE Covid-19 swab and of the patient's transfer to 1441.

## 2019-09-24 NOTE — Anesthesia Procedure Notes (Signed)
Anesthesia Regional Block: Femoral nerve block   Pre-Anesthetic Checklist: ,, timeout performed, Correct Patient, Correct Site, Correct Laterality, Correct Procedure, Correct Position, site marked, Risks and benefits discussed,  Surgical consent,  Pre-op evaluation,  At surgeon's request and post-op pain management  Laterality: Right  Prep: chloraprep       Needles:  Injection technique: Single-shot  Needle Type: Echogenic Needle     Needle Length: 9cm  Needle Gauge: 21     Additional Needles:   Narrative:  Start time: 09/24/2019 5:55 PM End time: 09/24/2019 5:59 PM Injection made incrementally with aspirations every 5 mL.  Performed by: Personally  Anesthesiologist: Audry Pili, MD  Additional Notes: No pain on injection. No increased resistance to injection. Injection made in 5cc increments. Good needle visualization. Patient tolerated the procedure well.

## 2019-09-24 NOTE — Progress Notes (Signed)
I called and spoke to [facility], Jason Vasquez daughter, to inform her of the positive Covid-19 result and that her father had been transferred to room #1441.  Her sister also joined the conversation at some point.  Both were told the above information, and that the patient's surgery had been postponed until this afternoon.  They had many questions, mostly about Covid,  that were answered as well as could be. They were encouraged to speak with their MD, their father's MD, or the Health Dept. For any additional questions re: Covid-19 and how they should proceed in light of their father's Covid + test.

## 2019-09-24 NOTE — Progress Notes (Signed)
Orthopedics Progress Note  Subjective: Patient admitted from the office yesterday for a complete quad tendon rupture that happened after a fall last week. He has been immobile since that time and moved only by [employer] lift and wheelchair. Admitted for surgical management and continued medical management. Upon admission to [facility] his COVID test was positive.  Patient now in a negative pressure room.  Patient has baseline dementia per family.  He has a very supportive family with several daughters who help care for him. His confusion has increased since hospitalization  Objective:  Vitals:   09/23/19 2225 09/24/19 0607  BP: (!) 153/68 (!) 187/84  Pulse: 61 65  Resp: 19 18  Temp: 99.5 F (37.5 C) 98.5 F (36.9 C)  SpO2: 97% 96%    General: Awake but sleepy. He does follow commands Musculoskeletal: right quad contracted with palpable defect at the superior pole of the patella No cords Neg Homans Neurovascularly intact  Lab Results  Component Value Date   WBC 4.0 09/23/2019   HGB 12.0 (L) 09/23/2019   HCT 36.0 (L) 09/23/2019   MCV 101.4 (H) 09/23/2019   PLT 292 09/23/2019       Component Value Date/Time   NA 136 09/23/2019 1838   K 3.2 (L) 09/23/2019 1838   CL 98 09/23/2019 1838   CO2 29 09/23/2019 1838   GLUCOSE 130 (H) 09/23/2019 1838   BUN 34 (H) 09/23/2019 1838   CREATININE 1.82 (H) 09/23/2019 1838   CALCIUM 8.7 (L) 09/23/2019 1838   GFRNONAA 32 (L) 09/23/2019 1838   GFRAA 38 (L) 09/23/2019 1838    Lab Results  Component Value Date   INR 1.0 09/23/2019    Assessment/Plan: Right quad tendon tear Discussed with the patient and family recommending repair. They agree with this plan. Medical consult COVID management R/o DVT with bilateral LE dopplers Surgery planned for this PM  Remo Lipps R. Veverly Fells, MD 09/24/2019 9:11 AM

## 2019-09-24 NOTE — Progress Notes (Signed)
OR called back regarding pending surgery to inform this RN that the patient's surgery would be postponed until this afternoon.   SBAR handoff report given to Belma Ceric,RN.

## 2019-09-24 NOTE — Progress Notes (Signed)
Orlene Erm, SW with [facility], called to check on patient. They provide daily home care for him. She wanted to check on patient and update our medical team on his history and medication.  RN provided contact numbers to pharmacy and to [facility].  [phone]; [phone]

## 2019-09-24 NOTE — Consult Note (Addendum)
Medical Consultation   Jason Vasquez  YBO:[id number]  DOB: May 28, 1929  DOA: 09/23/2019  PCP: Angelica Pou, MD   Requesting physician: Dr. Veverly Fells  Reason for consultation: Pre-op risk optimization Management of medical comorbidities   History of Present Illness: Jason Vasquez is an 85 y.o. male with past medical history of CKD 3, hyperlipidemia, type 2 diabetes, hypertension, GERD, BPH and dementia who presents after a fall 1 week prior with quadriceps tendon rupture to the right plan for operative repair with Dr. Veverly Fells today.  Hospitalist team has been consulted to assist with medical optimization and comanagement of chronic conditions.  Patient was noted to have a positive COVID-19 test on admission.  Patient reports he was in his usual state of health up until recent fall.  He states when he fell he tripped on his way outside the door and fell onto his right side.  Per review of the chart he was seen in ER and there was concern for possible quadriceps tendon rupture.  He was subsequently monitored at home and is taken care of by his daughters.  He has not been ambulatory.  Patient reports he has not had any symptoms besides right knee pain and immobility.  Specifically he denies any fevers, chills, shortness of breath, cough, diarrhea, new rashes, any changes to hair.  Of note the patient does have a chart history of COPD listed but he denies this history, denies smoking history, denies wheezing nor does he report ever being on inhalers.  He also denies any prior history of heart disease, specifically no reported heart attacks or arrhythmias.  He denies any chest pain or trouble breathing with activity prior to this recent fall.  He does report he has a history of diabetes.  He has not had any other chronic complaints.  He states his prior surgeries have gone without caught complications in the past.  When asking questions, he did report he is feeling a little confused and has  trouble with any multistep questions.  He reports at home his daughter assists him with medications but he largely does things on his own.  He he states he is otherwise been eating well and having normal bowel habits.  Patient has previously followed up with neurology but no notes since 2016.  Per their evaluation they felt the patient was having age related changes and Alzheimer's dementia.  Spoke with Blain Pais, daughter: Patient prior to fall was living independently, had a home health aide who would come to the house to assist with meals and showering.  She states she would arrange the patient's pill box and the aide would assist with giving meds.  Speaking with his daughter, she reports he often does not know month and year.  She states he is usually very confused in the morning.  She reports he was using a cane prior to fall.  She states he generally has a good appetite.  Since the fall the patient has been at home with the daughters.  She is aware of the COVID-19 screen being positive and she states unless one of the aides had it, she has not noticed any symptoms.  He has not been around with anyone having symptoms.  She has not had any fevers, chills, SOB, cough, diarrhea, nausea/vomiting.  We reviewed his medical hx, he is no longer on DM meds.  She is unsure if he has met with a nephrologist (managed thru [facility]).  She does not believe so.  She states his labs have always been reported to them as normal, so she is unaware if his renal function has been gradually declining.  No reported issues with his urine output. She states he has no history of COPD, he has never been on inhalers.  She states he was a former smoker, but it has been greater than 50 years.  He has no hx of heart diease or arrhythmia.  She states he does not have any hx of alcohol abuse or illicit drug use.  Prior to fall, she states he could ambulate but if he were to go up a flight of stairs, someone would need to be behind  him/assist him to ensure he didn't fall.  Review of Systems:  ROS As per HPI otherwise 10 point review of systems negative.    Past Medical History: Past Medical History:  Diagnosis Date  . CKD (chronic kidney disease)   . COPD (chronic obstructive pulmonary disease) ([place])   . Dementia ([place])   . Hypertension     Past Surgical History: History reviewed. No pertinent surgical history.   Allergies:   Allergies  Allergen Reactions  . Ace Inhibitors Cough  . Ciprofloxacin Rash    Other reaction(s): RASH   . Levofloxacin Rash    Other reaction(s): RASH   . Metformin Diarrhea    Other reaction(s): DIARRHEA   . Neomy-Bacit-Polymyx-Pramoxine Rash    Patient is allergic to all antibiotics except Doxycycline Patient is allergic to all antibiotics except Doxycycline   . Tramadol Nausea Only    Other reaction(s): NAUSEA      Social History:  reports that he has never smoked. He has never used smokeless tobacco. He reports that he does not drink alcohol or use drugs.   Family History: History reviewed. No pertinent family history.    Physical Exam: Vitals:   09/23/19 1848 09/23/19 2115 09/23/19 2225 09/24/19 0607  BP:   (!) 153/68 (!) 187/84  Pulse:   61 65  Resp:   19 18  Temp:   99.5 F (37.5 C) 98.5 F (36.9 C)  TempSrc:   Oral Oral  SpO2:   97% 96%  Weight:  102.1 kg  95.2 kg  Height: _0  (1.803 m) _1  (1.803 m)  _2  (1.803 m)    Constitutional: Pleasant, NAD, AAO to person and situation, thought he was at [place] (but quickly acknowledged when notified he was at [facility]); does not know year (2004) nor month Eyes: EOMI, irises appear normal, anicteric sclera,  ENMT: external ears and nose appear normal, hard of hearing,            Lips appears normal, oropharynx mucosa, tongue, posterior pharynx appear normal  Neck: neck appears normal, no masses, normal ROM, no thyromegaly, no JVD  CVS: S1-S2 clear, no murmur rubs or gallops, no LE edema, normal pedal  pulses  Respiratory:  clear to auscultation bilaterally, no wheezing, rales or rhonchi. Respiratory effort normal. No accessory muscle use. Of note, findings on exam limited by wearing PPE / CAPR Abdomen: soft nontender, nondistended, normal bowel sounds, no hepatosplenomegaly, no hernias  Musculoskeletal: : no cyanosis, clubbing or edema noted bilaterally Neuro: Cranial nerves II-XII intact, RLE extended but otherwise no gross deficits Psych: pleasant, mood and affect congruent Skin: no rashes or lesions or ulcers, no induration or nodules     Data reviewed:  I have personally reviewed following labs and imaging studies Labs:  CBC: Recent Labs  Lab 09/23/19 1838  WBC 4.0  NEUTROABS 2.1  HGB 12.0*  HCT 36.0*  MCV 101.4*  PLT 948    Basic Metabolic Panel: Recent Labs  Lab 09/23/19 1838  NA 136  K 3.2*  CL 98  CO2 29  GLUCOSE 130*  BUN 34*  CREATININE 1.82*  CALCIUM 8.7*   GFR Estimated Creatinine Clearance: 33.1 mL/min (A) (by C-G formula based on SCr of 1.82 mg/dL (H)). Liver Function Tests: No results for input(s): AST, ALT, ALKPHOS, BILITOT, PROT, ALBUMIN in the last 168 hours. No results for input(s): LIPASE, AMYLASE in the last 168 hours. No results for input(s): AMMONIA in the last 168 hours. Coagulation profile Recent Labs  Lab 09/23/19 1838  INR 1.0    Cardiac Enzymes: No results for input(s): CKTOTAL, CKMB, CKMBINDEX, TROPONINI in the last 168 hours. BNP: Invalid input(s): POCBNP CBG: No results for input(s): GLUCAP in the last 168 hours. D-Dimer No results for input(s): DDIMER in the last 72 hours. Hgb A1c No results for input(s): HGBA1C in the last 72 hours. Lipid Profile No results for input(s): CHOL, HDL, LDLCALC, TRIG, CHOLHDL, LDLDIRECT in the last 72 hours. Thyroid function studies No results for input(s): TSH, T4TOTAL, T3FREE, THYROIDAB in the last 72 hours.  Invalid input(s): FREET3 Anemia work up No results for input(s): VITAMINB12,  FOLATE, FERRITIN, TIBC, IRON, RETICCTPCT in the last 72 hours. Urinalysis No results found for: COLORURINE, APPEARANCEUR, LABSPEC, [place], GLUCOSEU, [place], [place], KETONESUR, PROTEINUR, UROBILINOGEN, NITRITE, LEUKOCYTESUR   Microbiology Recent Results (from the past 240 hour(s))  SARS CORONAVIRUS 2 (TAT 6-24 HRS) Nasopharyngeal Nasopharyngeal Swab     Status: Abnormal   Collection Time: 09/23/19  9:46 PM   Specimen: Nasopharyngeal Swab  Result Value Ref Range Status   SARS Coronavirus 2 POSITIVE (A) NEGATIVE Final    Comment: RESULT CALLED TO, READ BACK BY AND VERIFIED WITH: BDonnamarie Poag  5462 09/24/2019 T. TYSOR (NOTE) SARS-CoV-2 target nucleic acids are DETECTED. The SARS-CoV-2 RNA is generally detectable in upper and lower respiratory specimens during the acute phase of infection. Positive results are indicative of active infection with SARS-CoV-2. Clinical  correlation with patient history and other diagnostic information is necessary to determine patient infection status. Positive results do  not rule out bacterial infection or co-infection with other viruses. The expected result is Negative. Fact Sheet for Patients: SugarRoll.be Fact Sheet for Healthcare Providers: https://www.woods-mathews.com/ This test is not yet approved or cleared by the [place] FDA and  has been authorized for detection and/or diagnosis of SARS-CoV-2 by FDA under an Emergency Use Authorization (EUA). This EUA will remain  in effect (meaning this test can be used ) for the duration of the COVID-19 declaration under Section 564(b)(1) of the Act, 21 U.S.C. section 360bbb-3(b)(1), unless the authorization is terminated or revoked sooner. Performed at [facility], [place] [address], [place], [place] [address]   Surgical pcr screen     Status: None   Collection Time: 09/23/19  9:46 PM   Specimen: Nasal Mucosa; Nasal Swab  Result Value Ref Range  Status   MRSA, PCR NEGATIVE NEGATIVE Final   Staphylococcus aureus NEGATIVE NEGATIVE Final    Comment: (NOTE) The Xpert SA Assay (FDA approved for NASAL specimens in patients 31 years of age and older), is one component of a comprehensive surveillance program. It is not intended to diagnose infection nor to guide or monitor treatment. Performed at [facility], [place] [address], [place], [place] [address]  Inpatient Medications:   Scheduled Meds: . sodium chloride flush  10-40 mL Intracatheter Q12H   Continuous Infusions: . sodium chloride 75 mL/hr at 09/24/19 3545     Radiological Exams on Admission: Dg Chest 2 View  Result Date: 09/23/2019 CLINICAL DATA:  85 year old male under preoperative evaluation. EXAM: CHEST - 2 VIEW COMPARISON:  Chest x-ray 11/30/2017. FINDINGS: Lung volumes are low. No acute consolidative airspace disease. No pleural effusions. Mild diffuse interstitial prominence and peribronchial cuffing. No pneumothorax. No evidence of pulmonary edema. Heart size is normal. The patient is rotated to the right on today's exam, resulting in distortion of the mediastinal contours and reduced diagnostic sensitivity and specificity for mediastinal pathology. IMPRESSION: 1. Mild diffuse interstitial prominence and peribronchial cuffing, concerning for an acute bronchitis. Electronically Signed   By: Vinnie Langton M.D.   On: 09/23/2019 20:13    Impression/Recommendations Jason Vasquez is an 85 y.o. male with past medical history of CKD 3, hyperlipidemia, type 2 diabetes, hypertension, GERD, BPH and dementia who presents after a fall 1 week prior with quadriceps tendon rupture to the right plan for operative repair with Dr. Veverly Fells today.    # R. Complete Quadriceps tendon rupture - patient presently on his current home meds, patient and family deny prior hx of COPD (listed occasionally in chart) and presently patient is saturating well on room air and  avoid of any of the more commonly reported COVID-19 symptoms and this was confirmed with patient's daughter, Blain Pais. - with regards to renal function, unclear if his current creatinine is progression of his CKD as most recent care everywhere labs are from 2018 and possible given hx of DM (improved A1c suspect 2/2 progression of CKD) and HTN; however, given hypokalemia and to avoid pre-renal etiology would hold HCTZ.  Furthermore, in the event patient's renal function is more of an acute bump, agree with holding telmisartan.  Could resume diltiazem given patient is hypertensive, but defer to surgery/anesthesia as patient to go to OR this afternoon - his pre-fall functional status is estimated around 4 METs - per current comorbidities/presentation, the patient's RCRI is 0, reflecting an estimated 3.9% risk of cardiac event, death  # AKI vs. Progression of CKD # Anemia of chronic kidney disease - previously known CKD 3, possible progression of CKD with Creatinine of 1.82 (vs. AKI) - would continue to monitor renal function - HCTZ, Telmisartan have been held and given hypokalemia (suspect from HCTZ) would continue to hold for now - will repeat a BMP today to follow up K and Cr, continue mIVF Addendum: reviewed BMP, appears AKI present and suggest pre-renal etiology, continue to hold ARB and Thiazide for now, replete K post-op  # COVID-19 + - at present patient appears to be an asymptomatic carrier though exposure is unknown and thus may develop symptoms - at present oxygenating well on room air - given asymptomatic and no oxygen requirement, no steroids/remdesivir - monitor closely post-operatively  # Alzheimer's Dementia - age-related changed and alzheimer's noted.  Speaking with daughter, I suspect he is largely at his baseline - continue with delirium precautions; maintain daytime wakefulness, control pain, and maintain frequent re-orientation as post-operatively patient will be at risk of  delirium and sundowning - resumed donepezil for tomorrow  # HLD - resumed statin  # HTN - presently held in anticipation of surgery, but would resume diltiazem first post-operatively and manage as tolerated.  Hold HCTZ due to hypokalemia and telmisartan pending repeat renal function  # Hx of T2DM -  no longer on meds, improved A1c suspect due to CKD  # GERD - resume PPI  # BPH - can resume Finasteride  Thank you for this consultation.  Our [facility] hospitalist team will follow the patient with you.   Time Spent: 55 minutes spent assessing patient, speaking with family members and more than half the time was spent at bedside evaluating patient.  Truddie Hidden M.D. [facility] 09/24/2019, 10:18 AM  Pager [phone]

## 2019-09-24 NOTE — Anesthesia Procedure Notes (Signed)
Procedure Name: Intubation Date/Time: 09/24/2019 4:17 PM Performed by: West Pugh, CRNA Pre-anesthesia Checklist: Patient identified, Emergency Drugs available, Suction available, Patient being monitored and Timeout performed Patient Re-evaluated:Patient Re-evaluated prior to induction Oxygen Delivery Method: Circle system utilized Preoxygenation: Pre-oxygenation with 100% oxygen Induction Type: IV induction Laryngoscope Size: 3 and Glidescope Grade View: Grade I Tube type: Oral Tube size: 7.5 mm Number of attempts: 1 Airway Equipment and Method: Stylet Placement Confirmation: ETT inserted through vocal cords under direct vision,  positive ETCO2,  CO2 detector and breath sounds checked- equal and bilateral Secured at: 21 cm Tube secured with: Tape Dental Injury: Teeth and Oropharynx as per pre-operative assessment

## 2019-09-24 NOTE — Progress Notes (Signed)
Received a call from [facility] lab of POSITIVE Covid-19 swab. CN Carma Leaven, RN, and [place], RN/ [facility], notified and bed request transfer was placed.  Will notify the patient's MD of result and subsequent transfer.

## 2019-09-24 NOTE — Anesthesia Procedure Notes (Signed)
Spinal  Patient location during procedure: OR Staffing Anesthesiologist: Audry Pili, MD Performed: anesthesiologist  Preanesthetic Checklist Completed: patient identified, surgical consent, pre-op evaluation, timeout performed, IV checked, risks and benefits discussed and monitors and equipment checked Spinal Block Patient position: sitting Prep: DuraPrep Patient monitoring: heart rate, cardiac monitor, continuous pulse ox and blood pressure Approach: Tried both midline and right paramedian. Interspace: Tried at L2-3 and L3-4. Injection technique: single-shot Needle Needle type: Quincke  Needle gauge: 12 G Additional Notes Consent was obtained prior to the procedure with all questions answered and concerns addressed. Risks including, but not limited to, bleeding, infection, nerve damage, paralysis, failed block, inadequate analgesia, allergic reaction, high spinal, itching, and headache were discussed and the patient wished to proceed. Functioning IV was confirmed and monitors were applied. Sterile prep and drape, including hand hygiene, mask, and sterile gloves were used. The patient was positioned and the spine was prepped. The skin was anesthetized with lidocaine. Despite multiple attempts midline and paramedian at 2 levels, unable to locate intrathecal space. Decision made to abort procedure and proceed with general anesthesia. The patient tolerated the procedure well.   Renold Don, MD

## 2019-09-24 NOTE — Brief Op Note (Signed)
09/24/2019  5:59 PM  PATIENT:  Jason Vasquez  85 y.o. male  PRE-OPERATIVE DIAGNOSIS:  right quadracep tendon tear  POST-OPERATIVE DIAGNOSIS:  right quadracep tendon tear  PROCEDURE:  Procedure(s): REPAIR RIGHT QUADRICEP TENDON (Right) with retinacular repair  SURGEON:  Surgeon(s) and Role:    Netta Cedars, MD - Primary  PHYSICIAN ASSISTANT:   ASSISTANTS: none   ANESTHESIA:   regional and general  EBL:  minimal   BLOOD ADMINISTERED:none  DRAINS: none   LOCAL MEDICATIONS USED:  NONE  SPECIMEN:  No Specimen  DISPOSITION OF SPECIMEN:  N/A  COUNTS:  YES  TOURNIQUET:  * Missing tourniquet times found for documented tourniquets in log: [id number] *  DICTATION: .Other Dictation: Dictation Number [id number]  PLAN OF CARE: Admit to inpatient   PATIENT DISPOSITION:  PACU - hemodynamically stable.   Delay start of Pharmacological VTE agent (>24hrs) due to surgical blood loss or risk of bleeding: no

## 2019-09-24 NOTE — Progress Notes (Signed)
OR notified of the Covid-19 + swab. (pt is scheduled for surgery this AM).

## 2019-09-24 NOTE — Progress Notes (Signed)
Contacted Dr. Veverly Fells' office regarding patient BP and need for PRN medication. RN informed MD would call back after she gives message.

## 2019-09-24 NOTE — Anesthesia Preprocedure Evaluation (Addendum)
Anesthesia Evaluation  Patient identified by MRN, date of birth, ID band Patient confused    Reviewed: Allergy & Precautions, NPO status , Patient's Chart, lab work & pertinent test results  History of Anesthesia Complications Negative for: history of anesthetic complications  Airway Mallampati: II  TM Distance: >3 FB Neck ROM: Full    Dental  (+) Dental Advisory Given   Pulmonary   Denies COPD Covid +, asymptomatic    Pulmonary exam normal        Cardiovascular hypertension, Pt. on medications Normal cardiovascular exam     Neuro/Psych PSYCHIATRIC DISORDERS Dementia negative neurological ROS     GI/Hepatic Neg liver ROS, GERD  ,  Endo/Other  diabetes (no meds), Type 2  Renal/GU CRFRenal disease     Musculoskeletal negative musculoskeletal ROS (+)   Abdominal   Peds  Hematology negative hematology ROS (+)   Anesthesia Other Findings   Reproductive/Obstetrics                          Anesthesia Physical Anesthesia Plan  ASA: III  Anesthesia Plan: Spinal   Post-op Pain Management:    Induction:   PONV Risk Score and Plan: 1 and Treatment may vary due to age or medical condition and Propofol infusion  Airway Management Planned: Natural Airway and Simple Face Mask  Additional Equipment: None  Intra-op Plan:   Post-operative Plan:   Informed Consent: I have reviewed the patients History and Physical, chart, labs and discussed the procedure including the risks, benefits and alternatives for the proposed anesthesia with the patient or authorized representative who has indicated his/her understanding and acceptance.     Consent reviewed with POA  Plan Discussed with: CRNA and Anesthesiologist  Anesthesia Plan Comments: (Labs reviewed, platelets acceptable. Discussed risks and benefits of spinal with daughter Baldwin Crown) via telephone, including spinal/epidural hematoma,  infection, failed block, and PDPH. Discussed MAC in conjunction with spinal, as well as general anesthesia as a backup plan in case of unsuccessful/failed spinal. Daughter expressed understanding. All questions answered and daughter agreed to proceed with this plan.)       Anesthesia Quick Evaluation

## 2019-09-24 NOTE — Progress Notes (Signed)
Bilateral lower extremity venous duplex has been completed. Preliminary results can be found in CV Proc through chart review.  Results were given to Dr. Veverly Fells.  09/24/19 10:20 AM Carlos Levering RVT

## 2019-09-25 ENCOUNTER — Encounter (HOSPITAL_COMMUNITY): Payer: Self-pay | Admitting: Orthopedic Surgery

## 2019-09-25 DIAGNOSIS — I160 Hypertensive urgency: Secondary | ICD-10-CM

## 2019-09-25 DIAGNOSIS — U071 COVID-19: Secondary | ICD-10-CM

## 2019-09-25 LAB — RENAL FUNCTION PANEL
Albumin: 3.2 g/dL — ABNORMAL LOW (ref 3.5–5.0)
Anion gap: 10 (ref 5–15)
BUN: 30 mg/dL — ABNORMAL HIGH (ref 8–23)
CO2: 27 mmol/L (ref 22–32)
Calcium: 8.6 mg/dL — ABNORMAL LOW (ref 8.9–10.3)
Chloride: 98 mmol/L (ref 98–111)
Creatinine, Ser: 1.25 mg/dL — ABNORMAL HIGH (ref 0.61–1.24)
GFR calc Af Amer: 59 mL/min — ABNORMAL LOW (ref >60)
GFR calc non Af Amer: 51 mL/min — ABNORMAL LOW (ref >60)
Glucose, Bld: 151 mg/dL — ABNORMAL HIGH (ref 70–99)
Phosphorus: 3.5 mg/dL (ref 2.5–4.6)
Potassium: 4.1 mmol/L (ref 3.5–5.1)
Sodium: 135 mmol/L (ref 135–145)

## 2019-09-25 LAB — FIBRINOGEN: Fibrinogen: 475 mg/dL (ref 210–475)

## 2019-09-25 LAB — D-DIMER, QUANTITATIVE: D-Dimer, Quant: 2.34 ug/mL-FEU — ABNORMAL HIGH (ref 0.00–0.50)

## 2019-09-25 LAB — CBC
HCT: 43.6 % (ref 39.0–52.0)
Hemoglobin: 14.9 g/dL (ref 13.0–17.0)
MCH: 33.6 pg (ref 26.0–34.0)
MCHC: 34.2 g/dL (ref 30.0–36.0)
MCV: 98.2 fL (ref 80.0–100.0)
Platelets: 204 10*3/uL (ref 150–400)
RBC: 4.44 MIL/uL (ref 4.22–5.81)
RDW: 12.1 % (ref 11.5–15.5)
WBC: 3.4 10*3/uL — ABNORMAL LOW (ref 4.0–10.5)
nRBC: 0 % (ref 0.0–0.2)

## 2019-09-25 LAB — C-REACTIVE PROTEIN: CRP: 1.1 mg/dL — ABNORMAL HIGH (ref <1.0)

## 2019-09-25 LAB — LACTATE DEHYDROGENASE: LDH: 237 U/L — ABNORMAL HIGH (ref 98–192)

## 2019-09-25 LAB — PROCALCITONIN: Procalcitonin: 0.34 ng/mL

## 2019-09-25 MED ORDER — DEXAMETHASONE 6 MG PO TABS
6.0000 mg | ORAL_TABLET | Freq: Every day | ORAL | Status: DC
  Administered 2019-09-25 – 2019-09-30 (×6): 6 mg via ORAL
  Filled 2019-09-25 (×7): qty 1

## 2019-09-25 MED ORDER — ISOSORB DINITRATE-HYDRALAZINE 20-37.5 MG PO TABS
1.0000 | ORAL_TABLET | Freq: Three times a day (TID) | ORAL | Status: DC
  Administered 2019-09-25 – 2019-10-03 (×23): 1 via ORAL
  Filled 2019-09-25 (×28): qty 1

## 2019-09-25 NOTE — TOC Initial Note (Signed)
Transition of Care [facility]) - Initial/Assessment Note    Patient Details  Name: Jason Vasquez MRN: [id number] Date of Birth: [birth year]  [facility] CM/SW Contact:    Purcell Mouton, RN Phone Number: 09/25/2019, 10:48 AM  Clinical Narrative:                 Spoke with pt's daughter Robet Leu [phone] who states that she would like pt to go to SNF in [facility], [place] or [place] in [place]. A call to [facility] revealed that they are closed until 12N related to the rain. Aquilla Hacker, [place] was called with [facility] and left VM for her to return call.   Expected Discharge Plan: Skilled Nursing Facility Barriers to Discharge: No Barriers Identified   Patient Goals and CMS Choice     Choice offered to / list presented to : Adult Children  Expected Discharge Plan and Services Expected Discharge Plan: [place]   Discharge Planning Services: CM Consult                                          Prior Living Arrangements/Services   Lives with:: Adult Children Patient language and need for interpreter reviewed:: No        Need for Family Participation in Patient Care: Yes (Comment) Care giver support system in place?: Yes (comment)      Activities of Daily Living Home Assistive Devices/Equipment: Wheelchair, Eyeglasses, Radio producer (specify quad or straight), Built-in shower seat, Bedside commode/3-in-1, Walker (specify type)(front wheel) ADL Screening (condition at time of admission) Patient's cognitive ability adequate to safely complete daily activities?: Yes Is the patient deaf or have difficulty hearing?: No Does the patient have difficulty seeing, even when wearing glasses/contacts?: No Does the patient have difficulty concentrating, remembering, or making decisions?: Yes Patient able to express need for assistance with ADLs?: Yes Does the patient have difficulty dressing or bathing?: Yes Independently performs ADLs?: No Does the  patient have difficulty walking or climbing stairs?: Yes Weakness of Legs: Right Weakness of Arms/Hands: None  Permission Sought/Granted Permission sought to share information with : Case Manager                Emotional Assessment Appearance:: Appears stated age     Orientation: : Oriented to Self, Oriented to Place, Oriented to Situation, Oriented to  Time      Admission diagnosis:  Right Quadriceps Tendon tear Patient Active Problem List   Diagnosis Date Noted  . Quadriceps muscle rupture, right, initial encounter 09/24/2019  . Quadriceps tendon rupture, right, initial encounter 09/23/2019   PCP:  Angelica Pou, MD Pharmacy:   [place], [place] [address] Phone: [phone] Fax: [phone]     Social Determinants of Health (SDOH) Interventions    Readmission Risk Interventions No flowsheet data found.

## 2019-09-25 NOTE — NC FL2 (Signed)
[place] LEVEL OF CARE SCREENING TOOL     IDENTIFICATION  Patient Name: Jason Vasquez Birthdate: 1929-02-09 Sex: male Admission Date (Current Location): 09/23/2019  [facility] and [place] Number:  Herbalist and Address:         Provider Number: [id number]  Attending Physician Name and Address:  Netta Cedars, MD  Relative Name and Phone Number:  Baldwin Crown [phone] daughter    Current Level of Care: Hospital Recommended Level of Care: Nursing Facility Prior Approval Number:    Date Approved/Denied:   PASRR Number: [id number]  Discharge Plan: SNF    Current Diagnoses: Patient Active Problem List   Diagnosis Date Noted  . Quadriceps muscle rupture, right, initial encounter 09/24/2019  . Quadriceps tendon rupture, right, initial encounter 09/23/2019    Orientation RESPIRATION BLADDER Height & Weight     Self, Time, Situation, Place  Normal Continent Weight: 95.2 kg Height:  5\' 11"  (180.3 cm)  BEHAVIORAL SYMPTOMS/MOOD NEUROLOGICAL BOWEL NUTRITION STATUS      Continent Diet(Regular)  AMBULATORY STATUS COMMUNICATION OF NEEDS Skin   Extensive Assist(Right Knee replacement) Verbally Normal, Surgical wounds(Right knee surgery)                       Personal Care Assistance Level of Assistance  Bathing, Feeding, Dressing Bathing Assistance: Limited assistance Feeding assistance: Independent Dressing Assistance: Limited assistance     Functional Limitations Info  Sight, Hearing, Speech Sight Info: Adequate Hearing Info: Adequate Speech Info: Adequate    SPECIAL CARE FACTORS FREQUENCY  PT (By licensed PT), OT (By licensed OT)     PT Frequency: x5 OT Frequency: x5            Contractures Contractures Info: Not present    Additional Factors Info  Code Status Code Status Info: FULL             Current Medications (09/25/2019):  This is the current hospital active medication list Current Facility-Administered  Medications  Medication Dose Route Frequency Provider Last Rate Last Dose  . 0.9 %  sodium chloride infusion   Intravenous Continuous Netta Cedars, MD   Stopped at 09/24/19 1249  . 0.9 %  sodium chloride infusion   Intravenous Continuous Netta Cedars, MD 50 mL/hr at 09/24/19 2058    . acetaminophen (TYLENOL) tablet 325-650 mg  325-650 mg Oral Q6H PRN Netta Cedars, MD      . atorvastatin (LIPITOR) tablet 10 mg  10 mg Oral q1800 Netta Cedars, MD      . chlorthalidone (HYGROTON) tablet 50 mg  50 mg Oral Daily Netta Cedars, MD   50 mg at 09/25/19 1004  . citalopram (CELEXA) tablet 10 mg  10 mg Oral Daily Netta Cedars, MD   10 mg at 09/25/19 1004  . diltiazem (CARDIZEM CD) 24 hr capsule 420 mg  420 mg Oral Daily Netta Cedars, MD   420 mg at 09/24/19 2316  . docusate sodium (COLACE) capsule 100 mg  100 mg Oral BID Netta Cedars, MD   100 mg at 09/25/19 1005  . donepezil (ARICEPT) tablet 10 mg  10 mg Oral Daily Netta Cedars, MD   10 mg at 09/25/19 1004  . famotidine (PEPCID) tablet 10 mg  10 mg Oral BID Netta Cedars, MD   10 mg at 09/25/19 1004  . ferrous sulfate tablet 325 mg  325 mg Oral TID Yolonda Kida, MD   325 mg at 09/25/19 1005  . finasteride (PROSCAR) tablet 5  mg  5 mg Oral Wendi Maya, MD   5 mg at 09/24/19 2113  . fluticasone (FLONASE) 50 MCG/ACT nasal spray 1 spray  1 spray Each Nare Daily Beverely Low, MD   1 spray at 09/25/19 1005  . HYDROcodone-acetaminophen (NORCO/VICODIN) 5-325 MG per tablet 1 tablet  1 tablet Oral Q4H PRN Beverely Low, MD   1 tablet at 09/25/19 0503  . HYDROcodone-acetaminophen (NORCO/VICODIN) 5-325 MG per tablet 1-2 tablet  1-2 tablet Oral Q4H PRN Beverely Low, MD   1 tablet at 09/24/19 [id number]  . irbesartan (AVAPRO) tablet 300 mg  300 mg Oral Daily Beverely Low, MD   300 mg at 09/25/19 1004  . LORazepam (ATIVAN) injection 0.5 mg  0.5 mg Intravenous Q6H PRN Beverely Low, MD   0.5 mg at 09/24/19 1029  . menthol-cetylpyridinium (CEPACOL) lozenge 3 mg   1 lozenge Oral PRN Beverely Low, MD       Or  . phenol [facility]) mouth spray 1 spray  1 spray Mouth/Throat PRN Beverely Low, MD      . methocarbamol (ROBAXIN) tablet 500 mg  500 mg Oral Q6H PRN Beverely Low, MD   500 mg at 09/24/19 2101  . metoCLOPramide (REGLAN) tablet 5-10 mg  5-10 mg Oral Q8H PRN Beverely Low, MD       Or  . metoCLOPramide [facility]) injection 5-10 mg  5-10 mg Intravenous Q8H PRN Beverely Low, MD      . morphine 4 MG/ML injection 0.52-1 mg  0.52-1 mg Intravenous Q2H PRN Beverely Low, MD   1 mg at 09/24/19 2104  . ondansetron (ZOFRAN) tablet 4 mg  4 mg Oral Q6H PRN Beverely Low, MD       Or  . ondansetron [facility]) injection 4 mg  4 mg Intravenous Q6H PRN Beverely Low, MD      . pantoprazole (PROTONIX) EC tablet 80 mg  80 mg Oral Daily Beverely Low, MD   80 mg at 09/25/19 1005  . polyethylene glycol (MIRALAX / GLYCOLAX) packet 17 g  17 g Oral Daily PRN Beverely Low, MD      . sodium chloride flush (NS) 0.9 % injection 10-40 mL  10-40 mL Intracatheter Q12H Beverely Low, MD   10 mL at 09/25/19 1005  . sodium chloride flush (NS) 0.9 % injection 10-40 mL  10-40 mL Intracatheter PRN Beverely Low, MD      . traZODone (DESYREL) tablet 25 mg  25 mg Oral Wendi Maya, MD   25 mg at 09/24/19 2315     Discharge Medications: Please see discharge summary for a list of discharge medications.  Relevant Imaging Results:  Relevant Lab Results:   Additional Information [id number]  Geni Bers, RN

## 2019-09-25 NOTE — Progress Notes (Signed)
Patient's BP improved this a.m., 118/104, HR 111. Patient slightly confused overnight.

## 2019-09-25 NOTE — TOC Progression Note (Signed)
Transition of Care [facility]) - Progression Note    Patient Details  Name: Jason Vasquez MRN: [id number] Date of Birth: Mar 31, 1929  [facility] CM/SW Contact  Purcell Mouton, RN Phone Number: 09/25/2019, 11:17 AM  Clinical Narrative:    FL2 with PASRR was completed in Chart.    Expected Discharge Plan: Skilled Nursing Facility Barriers to Discharge: No Barriers Identified  Expected Discharge Plan and Services Expected Discharge Plan: [place]   Discharge Planning Services: CM Consult                                           Social Determinants of Health (SDOH) Interventions    Readmission Risk Interventions No flowsheet data found.

## 2019-09-25 NOTE — Op Note (Signed)
Jason Vasquez, Jason Vasquez MEDICAL RECORD NW:[id number] ACCOUNT [id number] DATE OF BIRTH:[birth year] FACILITY: [facility] LOCATION: [facility] PHYSICIAN:STEVEN R. Keonte Daubenspeck, Jason Vasquez  OPERATIVE REPORT  DATE OF PROCEDURE:  09/24/2019  PREOPERATIVE DIAGNOSIS:  Right quadriceps tendon tear, complete.  POSTOPERATIVE DIAGNOSIS:  Right quadriceps tendon tear, complete.  PROCEDURE PERFORMED:  Open repair of right quadriceps tendon rupture with retinacular repair.  ATTENDING SURGEON:  Jason Plants, Jason Vasquez  ASSISTANT:  None.  ANESTHESIA:  General anesthesia was used plus femoral nerve block.  ESTIMATED BLOOD LOSS:  Minimal.  FLUID REPLACEMENT:  1000 mL crystalloid.  INSTRUMENT COUNTS:  Correct.  COMPLICATIONS:  No complications.  ANTIBIOTICS:  Perioperative antibiotics were given.  INDICATIONS:  The patient is an 85 year old male who presents with a history of a fall onto a flexed knee last week.  The patient presented to the [facility].  X-rays were negative for fracture.  The patient sent out and  told to follow up with orthopedic surgery.  The patient was unable to ambulate at home and presented to orthopedics yesterday complaining of knee pain and inability to lift his leg or stand.  Clinically, the patient had a complete quad tendon tear.  I  discussed this with the patient and his family recommending admission and workup in preparation for repair.  The patient was admitted to [facility].  Preoperative workup indicated some renal insufficiency, also indicated COVID-positive.  The  patient placed in a negative pressure room and COVID protocols.  The patient now brought to the operating room theater after being seen by internal medicine and cleared for surgery and optimized medically for primary repair of quadriceps tendon rupture.   Risks and benefits of surgery discussed in detail with his family, multiple daughters and informed consent was obtained.  DESCRIPTION OF  PROCEDURE:  After an adequate level of anesthesia was achieved, the patient was positioned supine on the operating room table.  Nonsterile tourniquet was placed on the proximal thigh.  Right leg was sterilely prepped and draped in the  usual manner.  Timeout was called, verifying correct patient and correct site.  We used a longitudinal midline incision with a 10 blade scalpel.  Dissection down through subcutaneous tissues using Bovie.  Identified the torn quadriceps tendon, which was  completely ruptured off the superior pole of the patella.  We freshened up the end of the tendon with a 10 blade scalpel.  We removed all soft tissue off the proximal pole of the patella.  We drilled 3 longitudinal drill holes in the patella exiting  distally.  We then went ahead and used #5 FiberWire in a locked Krakow stitch technique up into the quadriceps tendon with 4 suture strands exiting the distal tendon 2 in the middle and 1 on either side.  We then used a Swanson suture passer to pass the  #5 FiberWire through the drill holes such that we were able to retrieve 4 strands down through the distal patella.  We then passed the suture under the patellar tendon distally and then clipped that with hemostats.  We then used #2 FiberWire suture to do  a total of 5 simple sutures in the retinaculum to the medial side and lateral side of the quadriceps tendon tear.  We irrigated thoroughly.  We then went ahead and placed the knee in hyperextension and pulling down on the quadriceps we reduced the  quadriceps to the patella.  We tied those sutures distally and then we went ahead and tied our retinacular sutures.  We had a very solid repair.  There was a little bit of gapping at 20 degrees of flexion.  Thus, we will maintain full extension.  At this  point, we placed a sterile bandage, which was Adaptic, 4 x 4s, and Kerlix.  We then placed a long leg splint with 15 thicknesses followed by 30 posteriorly and10 thicknesses on the  side medially and laterally and a stirrup around the ankle.  We then  placed a femoral nerve block post-surgery to assist with pain control and also to take away any quad contraction the patient could have.  He does have some dementia and we are hoping to allow him to wake up more from anesthesia prior to him having  control over his quad so this is going to serve multiple purposes, and to relax quadriceps muscle.  He tolerated the surgery well.  Long leg splint placed.  The patient will be nonweightbearing and will be likely just wheelchair transfers.  He is going  to go on to a postoperative DVT protocol with Lovenox starting tomorrow afternoon.  The patient tolerated surgery well and I spoke to his daughters to inform them.  CN/NUANCE  D:09/24/2019 T:09/25/2019 JOB:[id number]

## 2019-09-25 NOTE — Evaluation (Signed)
Physical Therapy Evaluation Patient Details Name: Jason Vasquez MRN: [id number] DOB: May 08, 1929 Today's Date: 09/25/2019   History of Present Illness  Pt is 85 yo male s/p fall with R knee pain and quad tendon rupture.  He has history of CKD, COPD, dementia, and HTN.  Upon admission, pt found to be COVID 19 +.  Pt s/p open repair R quadriceps tendon rupture with retinacular repair on 09/24/19. Pt with long leg splint and NWB  Clinical Impression   Pt is s/p quadriceps tendon repair on POD #1 and +COVID resulting in the deficits listed below (see PT Problem List). Pt with dementia and confusion that limit his ability to comply with NWB in standing.  Pt did well with NWB and scooting with min A.  His vital signs were stable with no episodes of coughing or SHOB.  Pt will benefit from skilled PT to increase their independence and safety with mobility to allow discharge to the venue listed below.    Follow Up Recommendations SNF    Equipment Recommendations  None recommended by PT    Recommendations for Other Services       Precautions / Restrictions Precautions Precautions: Fall Required Braces or Orthoses: Splint/Cast Splint/Cast: Long leg splint R LE Restrictions Weight Bearing Restrictions: Yes RLE Weight Bearing: Non weight bearing      Mobility  Bed Mobility Overal bed mobility: Needs Assistance Bed Mobility: Supine to Sit;Sit to Supine Rolling: Min assist   Supine to sit: Min assist Sit to supine: Min assist   General bed mobility comments: cues and assist for R LE  Transfers Overall transfer level: Needs assistance Equipment used: Rolling walker (2 wheeled) Transfers: Sit to/from Stand Sit to Stand: Max assist         General transfer comment: unable to maintain NWB so returned to sitting;  Pt was able to scoot toward HOB while sitting with min A and maintained NWB  Ambulation/Gait                Stairs            Wheelchair Mobility    Modified  Rankin (Stroke Patients Only)       Balance Overall balance assessment: Needs assistance Sitting-balance support: Feet unsupported;Bilateral upper extremity supported Sitting balance-Leahy Scale: Fair Sitting balance - Comments: does start to lean on elbows at times     Standing balance-Leahy Scale: Zero                               Pertinent Vitals/Pain Pain Assessment: No/denies pain    Home Living Family/patient expects to be discharged to:: Skilled nursing facility                 Additional Comments: Pt confused and unreliable historian.  Reports he lives home alone with 4 daughters and aides to assist at times.  Also, stated he was independent with all ADLs and community ambulation.    Prior Function Level of Independence: Independent with assistive device(s)               Hand Dominance        Extremity/Trunk Assessment   Upper Extremity Assessment Upper Extremity Assessment: Defer to OT evaluation    Lower Extremity Assessment Lower Extremity Assessment: RLE deficits/detail RLE Deficits / Details: in long leg splint; demonstrates 2/5 L hip strength for transfers       Communication   Communication: No difficulties  Cognition Arousal/Alertness: Awake/alert Behavior During Therapy: WFL for tasks assessed/performed Overall Cognitive Status: No family/caregiver present to determine baseline cognitive functioning                                 General Comments: Pt with hx of dementia; only oriented to self; not able to state president or why we are wearing mask, states year 2004      General Comments General comments (skin integrity, edema, etc.): O2 sats 97% on RA    Exercises     Assessment/Plan    PT Assessment Patient needs continued PT services  PT Problem List Decreased strength;Decreased mobility;Decreased safety awareness;Decreased range of motion;Decreased activity tolerance;Decreased cognition;Decreased  knowledge of precautions;Decreased balance       PT Treatment Interventions DME instruction;Therapeutic activities;Therapeutic exercise;Patient/family education;Balance training;Functional mobility training;Wheelchair mobility training(stand pivots or lateral scoots)    PT Goals (Current goals can be found in the Care Plan section)  Acute Rehab PT Goals Patient Stated Goal: In agreement to SNF PT Goal Formulation: With patient Time For Goal Achievement: 10/09/19 Potential to Achieve Goals: Good    Frequency Min 5X/week   Barriers to discharge Inaccessible home environment NWB    Co-evaluation               AM-PAC PT "6 Clicks" Mobility  Outcome Measure Help needed turning from your back to your side while in a flat bed without using bedrails?: A Little Help needed moving from lying on your back to sitting on the side of a flat bed without using bedrails?: A Little Help needed moving to and from a bed to a chair (including a wheelchair)?: A Lot Help needed standing up from a chair using your arms (e.g., wheelchair or bedside chair)?: A Lot Help needed to walk in hospital room?: Total Help needed climbing 3-5 steps with a railing? : Total 6 Click Score: 12    End of Session Equipment Utilized During Treatment: Gait belt Activity Tolerance: Patient tolerated treatment well Patient left: in bed;with bed alarm set;with call bell/phone within reach Nurse Communication: Mobility status PT Visit Diagnosis: Unsteadiness on feet (R26.81);History of falling (Z91.81);Muscle weakness (generalized) (M62.81)    Time: 6761-9509 PT Time Calculation (min) (ACUTE ONLY): 34 min   Charges:   PT Evaluation $PT Eval Moderate Complexity: 1 Mod PT Treatments $Therapeutic Activity: 8-22 mins        Royetta Asal, PT Acute Rehab Services Pager [phone] [facility] [phone] [facility] [phone]   Rayetta Humphrey 09/25/2019, 5:08 PM

## 2019-09-25 NOTE — Progress Notes (Signed)
Orthopedics Progress Note  Subjective: Patient awake and alert this morning. Pain controlled  Objective:  Vitals:   09/24/19 2315 09/25/19 0252  BP: (!) 153/94 (!) 118/104  Pulse: 89 87  Resp: 16 16  Temp: 97.7 F (36.5 C) 98.2 F (36.8 C)  SpO2: 100% 96%    General: Awake and alert, responds to commands  Musculoskeletal: right leg immobilized in LLS, wiggles toes Neurovascularly intact  Lab Results  Component Value Date   WBC 3.4 (L) 09/25/2019   HGB 14.9 09/25/2019   HCT 43.6 09/25/2019   MCV 98.2 09/25/2019   PLT 204 09/25/2019       Component Value Date/Time   NA 135 09/24/2019 1255   K 3.1 (L) 09/24/2019 1255   CL 100 09/24/2019 1255   CO2 26 09/24/2019 1255   GLUCOSE 108 (H) 09/24/2019 1255   BUN 26 (H) 09/24/2019 1255   CREATININE 1.37 (H) 09/24/2019 1255   CALCIUM 8.7 (L) 09/24/2019 1255   GFRNONAA 46 (L) 09/24/2019 1255   GFRAA 53 (L) 09/24/2019 1255    Lab Results  Component Value Date   INR 1.0 09/23/2019    Assessment/Plan: POD #1 s/p Procedure(s): REPAIR RIGHT QUADRICEP TENDON Mobilization with PT, OT D/C planning - will need fairly long term SNF - 6 -12 weeks Appreciate internal medicine assistance with medical co-management and pre-op optimization!  Doran Heater. Veverly Fells, MD 09/25/2019 7:50 AM

## 2019-09-25 NOTE — Progress Notes (Signed)
PROGRESS NOTE    Jason Vasquez  TDH:[id number] DOB: 01/04/1929 DOA: 09/23/2019 PCP: Miguel Aschoff, MD  Outpatient Specialists:   Brief Narrative:  Patient is an 85 year old African-American male with past medical history significant for chronic kidney disease stage III, hyperlipidemia, diabetes mellitus type 2, hypertension, GERD, BPH and dementia.  Patient was admitted to the orthopedic team following a fall.  Apparently, patient had quadricep tendon rupture following the fall.  Patient has undergone surgery.  Hospitalist team was initially asked to assist with chronic medical problem management.  However, patient's COVID-19 came positive and patient's care has been transferred to the hospitalist team.  The plan is to transfer patient to [facility] campus for further management of the COVID-19 infection.  Orthopedic team will need to clear patient for transfer.   Assessment & Plan:   Active Problems:   Quadriceps tendon rupture, right, initial encounter   Quadriceps muscle rupture, right, initial encounter  COVID-19 infection: -Check inflammatory markers daily -Decadron 6 Mg p.o. once daily for 10 days -Consider remdesivir -Zinc sulfate -Vitamin C -Supportive care -Transfer patient to [facility] once cleared for transfer by orthopedic team. -Resolving. -Baseline is unknown -Monitor closely -Optimize blood pressure control  Right complete quadricep tendon rupture: -Status post repair by orthopedic team. -Ortho team is managing.  Alzheimer's dementia: -Stable. -No behavioral problems. -Manage expectantly.  Hypertension: -Uncontrolled. -DC chlorthalidone. -Start BiDil. -Monitor closely.  Diabetes mellitus type 2: Not on medication Monitor blood sugar.  DVT prophylaxis: SCD Code Status: Full code Family Communication:  Disposition Plan: Transfer patient to [facility] when cleared for the stable orthopedic team   Consultants:   Originally admitted to  orthopedic team.  Patient was transferred to the hospitalist service.  Procedures:   Repair of ruptured right quadriceps tendons.  Antimicrobials:   None   Subjective: No new complaints.  Objective: Vitals:   09/24/19 2315 09/25/19 0252 09/25/19 0933 09/25/19 1004  BP: (!) 153/94 (!) 118/104 (!) 195/104 (!) 154/91  Pulse: 89 87 66   Resp: 16 16    Temp: 97.7 F (36.5 C) 98.2 F (36.8 C)    TempSrc: Oral Oral    SpO2: 100% 96%    Weight:      Height:        Intake/Output Summary (Last 24 hours) at 09/25/2019 1303 Last data filed at 09/25/2019 0600 Gross per 24 hour  Intake 2140.62 ml  Output 160 ml  Net 1980.62 ml   Filed Weights   09/23/19 2115 09/24/19 0607  Weight: 102.1 kg 95.2 kg    Examination:  General exam: Appears calm and comfortable  Respiratory system: Clear to auscultation. Cardiovascular system: S1 & S2 heard. Gastrointestinal system: Abdomen is soft and nontender.   Central nervous system: Alert and oriented.  Patient moves all extremities.   Extremities: Right lower extremity is wrapped.  Data Reviewed: I have personally reviewed following labs and imaging studies  CBC: Recent Labs  Lab 09/23/19 1838 09/25/19 0346  WBC 4.0 3.4*  NEUTROABS 2.1  --   HGB 12.0* 14.9  HCT 36.0* 43.6  MCV 101.4* 98.2  PLT 292 204   Basic Metabolic Panel: Recent Labs  Lab 09/23/19 1838 09/24/19 1255 09/25/19 0935  NA 136 135 135  K 3.2* 3.1* 4.1  CL 98 100 98  CO2 29 26 27   GLUCOSE 130* 108* 151*  BUN 34* 26* 30*  CREATININE 1.82* 1.37* 1.25*  CALCIUM 8.7* 8.7* 8.6*  PHOS  --   --  3.5   GFR: Estimated Creatinine Clearance: 48.1 mL/min (A) (by C-G formula based on SCr of 1.25 mg/dL (H)). Liver Function Tests: Recent Labs  Lab 09/25/19 0935  ALBUMIN 3.2*   No results for input(s): LIPASE, AMYLASE in the last 168 hours. No results for input(s): AMMONIA in the last 168 hours. Coagulation Profile: Recent Labs  Lab 09/23/19 1838  INR  1.0   Cardiac Enzymes: No results for input(s): CKTOTAL, CKMB, CKMBINDEX, TROPONINI in the last 168 hours. BNP (last 3 results) No results for input(s): PROBNP in the last 8760 hours. HbA1C: No results for input(s): HGBA1C in the last 72 hours. CBG: No results for input(s): GLUCAP in the last 168 hours. Lipid Profile: No results for input(s): CHOL, HDL, LDLCALC, TRIG, CHOLHDL, LDLDIRECT in the last 72 hours. Thyroid Function Tests: No results for input(s): TSH, T4TOTAL, FREET4, T3FREE, THYROIDAB in the last 72 hours. Anemia Panel: No results for input(s): VITAMINB12, FOLATE, FERRITIN, TIBC, IRON, RETICCTPCT in the last 72 hours. Urine analysis: No results found for: COLORURINE, APPEARANCEUR, LABSPEC, PHURINE, GLUCOSEU, HGBUR, BILIRUBINUR, KETONESUR, PROTEINUR, UROBILINOGEN, NITRITE, LEUKOCYTESUR Sepsis Labs: @LABRCNTIP (procalcitonin:4,lacticidven:4)  ) Recent Results (from the past 240 hour(s))  SARS CORONAVIRUS 2 (TAT 6-24 HRS) Nasopharyngeal Nasopharyngeal Swab     Status: Abnormal   Collection Time: 09/23/19  9:46 PM   Specimen: Nasopharyngeal Swab  Result Value Ref Range Status   SARS Coronavirus 2 POSITIVE (A) NEGATIVE Final    Comment: RESULT CALLED TO, READ BACK BY AND VERIFIED WITH: BDonnamarie Poag  0960 09/24/2019 T. TYSOR (NOTE) SARS-CoV-2 target nucleic acids are DETECTED. The SARS-CoV-2 RNA is generally detectable in upper and lower respiratory specimens during the acute phase of infection. Positive results are indicative of active infection with SARS-CoV-2. Clinical  correlation with patient history and other diagnostic information is necessary to determine patient infection status. Positive results do  not rule out bacterial infection or co-infection with other viruses. The expected result is Negative. Fact Sheet for Patients: SugarRoll.be Fact Sheet for Healthcare Providers: https://www.woods-mathews.com/ This test is  not yet approved or cleared by the [place] FDA and  has been authorized for detection and/or diagnosis of SARS-CoV-2 by FDA under an Emergency Use Authorization (EUA). This EUA will remain  in effect (meaning this test can be used ) for the duration of the COVID-19 declaration under Section 564(b)(1) of the Act, 21 U.S.C. section 360bbb-3(b)(1), unless the authorization is terminated or revoked sooner. Performed at [facility], [place] [address] [place], [place] [address]   Surgical pcr screen     Status: None   Collection Time: 09/23/19  9:46 PM   Specimen: Nasal Mucosa; Nasal Swab  Result Value Ref Range Status   MRSA, PCR NEGATIVE NEGATIVE Final   Staphylococcus aureus NEGATIVE NEGATIVE Final    Comment: (NOTE) The Xpert SA Assay (FDA approved for NASAL specimens in patients 5 years of age and older), is one component of a comprehensive surveillance program. It is not intended to diagnose infection nor to guide or monitor treatment. Performed at [facility], [place] [address] [place], [place] [address]          Radiology Studies: Dg Chest 2 View  Result Date: 09/23/2019 CLINICAL DATA:  84 year old male under preoperative evaluation. EXAM: CHEST - 2 VIEW COMPARISON:  Chest x-ray 11/30/2017. FINDINGS: Lung volumes are low. No acute consolidative airspace disease. No pleural effusions. Mild diffuse interstitial prominence and peribronchial cuffing. No pneumothorax. No evidence of pulmonary edema. Heart size is  normal. The patient is rotated to the right on today's exam, resulting in distortion of the mediastinal contours and reduced diagnostic sensitivity and specificity for mediastinal pathology. IMPRESSION: 1. Mild diffuse interstitial prominence and peribronchial cuffing, concerning for an acute bronchitis. Electronically Signed   By: Trudie Reedaniel  Entrikin M.D.   On: 09/23/2019 20:13   Vas Koreas Lower Extremity Venous (dvt)  Result Date: 09/25/2019   Lower Venous Study Indications: Swelling, and Tendon tear.  Risk Factors: Trauma. Limitations: Poor ultrasound/tissue interface and patient positioning. Comparison Study: No prior studies. Performing Technologist: Chanda BusingGregory Collins RVT  Examination Guidelines: A complete evaluation includes B-mode imaging, spectral Doppler, color Doppler, and power Doppler as needed of all accessible portions of each vessel. Bilateral testing is considered an integral part of a complete examination. Limited examinations for reoccurring indications may be performed as noted.  +---------+---------------+---------+-----------+----------+--------------+  RIGHT     Compressibility Phasicity Spontaneity Properties Thrombus Aging  +---------+---------------+---------+-----------+----------+--------------+  CFV       Full            Yes       Yes                                    +---------+---------------+---------+-----------+----------+--------------+  SFJ       Full                                                             +---------+---------------+---------+-----------+----------+--------------+  FV Prox   Full                                                             +---------+---------------+---------+-----------+----------+--------------+  FV Mid    Full                                                             +---------+---------------+---------+-----------+----------+--------------+  FV Distal Full                                                             +---------+---------------+---------+-----------+----------+--------------+  PFV       Full                                                             +---------+---------------+---------+-----------+----------+--------------+  POP       Full            Yes       Yes                                    +---------+---------------+---------+-----------+----------+--------------+  PTV       Full                                                              +---------+---------------+---------+-----------+----------+--------------+  PERO      Full                                                             +---------+---------------+---------+-----------+----------+--------------+   +---------+---------------+---------+-----------+----------+--------------+  LEFT      Compressibility Phasicity Spontaneity Properties Thrombus Aging  +---------+---------------+---------+-----------+----------+--------------+  CFV       Full            Yes       Yes                                    +---------+---------------+---------+-----------+----------+--------------+  SFJ       Full                                                             +---------+---------------+---------+-----------+----------+--------------+  FV Prox   Full                                                             +---------+---------------+---------+-----------+----------+--------------+  FV Mid    Full                                                             +---------+---------------+---------+-----------+----------+--------------+  FV Distal Full                                                             +---------+---------------+---------+-----------+----------+--------------+  PFV       Full                                                             +---------+---------------+---------+-----------+----------+--------------+  POP       Full            Yes       Yes                                    +---------+---------------+---------+-----------+----------+--------------+  PTV       Full                                                             +---------+---------------+---------+-----------+----------+--------------+  PERO      Full                                                             +---------+---------------+---------+-----------+----------+--------------+     Summary: Right: There is no evidence of deep vein thrombosis in the lower extremity. However, portions of this  examination were limited- see technologist comments above. No cystic structure found in the popliteal fossa. Left: There is no evidence of deep vein thrombosis in the lower extremity. However, portions of this examination were limited- see technologist comments above. No cystic structure found in the popliteal fossa.  *See table(s) above for measurements and observations. Electronically signed by Gretta Began MD on 09/25/2019 at 6:52:59 AM.    Final         Scheduled Meds:  atorvastatin  10 mg Oral q1800   chlorthalidone  50 mg Oral Daily   citalopram  10 mg Oral Daily   diltiazem  420 mg Oral Daily   docusate sodium  100 mg Oral BID   donepezil  10 mg Oral Daily   famotidine  10 mg Oral BID   ferrous sulfate  325 mg Oral TID PC   finasteride  5 mg Oral QHS   fluticasone  1 spray Each Nare Daily   irbesartan  300 mg Oral Daily   pantoprazole  80 mg Oral Daily   sodium chloride flush  10-40 mL Intracatheter Q12H   traZODone  25 mg Oral QHS   Continuous Infusions:  sodium chloride Stopped (09/24/19 1249)   sodium chloride 50 mL/hr at 09/24/19 2058     LOS: 2 days    Time spent: 35 minutes    Berton Mount, MD  [facility] Pager #: [phone] 7PM-7AM contact night coverage as above

## 2019-09-25 NOTE — Addendum Note (Signed)
Addendum  created 09/25/19 0850 by West Pugh, CRNA   Charge Capture section accepted

## 2019-09-25 NOTE — Plan of Care (Signed)
PATIENT FELL AND SUSTAINED R QUADRICEPS TEAR AND REQUIRED REPAIR. PATIENT TESTED POSITIVE FOR COVID 19 POST ADMISSION TO HOSPITAL. PATIENT HAS DECREASED  I AND SAFETY WITH ADLS AND MOBILITY. PATIENT MD IS RECOMMENDING SNF FOR 6-12 WEEKS. PATIENT STATES HE IS AN AGREEMENT WITH D/C PLAN. ACUTE OT TO FOLLOW PNT UNTIL D/C TO SNF.

## 2019-09-25 NOTE — Anesthesia Postprocedure Evaluation (Addendum)
Anesthesia Post Note  Patient: Jason Vasquez  Procedure(s) Performed: REPAIR RIGHT QUADRICEP TENDON (Right Knee)     Patient location during evaluation: Other Anesthesia Type: General Level of consciousness: awake and confused (at baseline) Pain management: pain level controlled Vital Signs Assessment: post-procedure vital signs reviewed and stable Respiratory status: spontaneous breathing, nonlabored ventilation, respiratory function stable and patient connected to nasal cannula oxygen Cardiovascular status: blood pressure returned to baseline and stable Postop Assessment: no apparent nausea or vomiting Anesthetic complications: no Comments: Recovered in OR due to Covid status    Last Vitals:  Vitals:   09/24/19 2315 09/25/19 0252  BP: (!) 153/94 (!) 118/104  Pulse: 89 87  Resp: 16 16  Temp: 36.5 C 36.8 C  SpO2: 100% 96%    Last Pain:  Vitals:   09/25/19 0600  TempSrc:   PainSc: [place] Ardyth [place]

## 2019-09-25 NOTE — Evaluation (Signed)
Occupational Therapy Evaluation Patient Details Name: Jason Vasquez MRN: [id number] DOB: 1929-08-16 Today's Date: 09/25/2019    History of Present Illness complete quad tendon rupture that happened after a fall last week. He has been immobile since that time and moved only by [employer] lift and wheelchair. Admitted for surgical management and continued medical management. Upon admission to [facility] his COVID test was positive.  Patient now in a negative pressure room.  Patient has baseline dementia per family. PMH HTN, COPD, CKD   Clinical Impression   PATIENT FELL AND SUSTAINED R QUADRICEPS TEAR AND REQUIRED REPAIR. PATIENT TESTED POSITIVE FOR COVID 19 POST ADMISSION TO HOSPITAL. PATIENT HAS DECREASED  I AND SAFETY WITH ADLS AND MOBILITY. PATIENT MD IS RECOMMENDING SNF FOR 6-12 WEEKS. PATIENT STATES HE IS AN AGREEMENT WITH D/C PLAN. ACUTE OT TO FOLLOW PNT UNTIL D/C TO SNF.    Follow Up Recommendations  SNF    Equipment Recommendations  None recommended by OT    Recommendations for Other Services       Precautions / Restrictions Precautions Precautions: (KI WHEN OUT OF BED) Restrictions Weight Bearing Restrictions: Yes RLE Weight Bearing: Non weight bearing      Mobility Bed Mobility Overal bed mobility: Needs Assistance Bed Mobility: Rolling Rolling: Min assist            Transfers                      Balance                                           ADL either performed or assessed with clinical judgement   ADL Overall ADL's : Needs assistance/impaired Eating/Feeding: Independent   Grooming: Wash/dry hands;Wash/dry face;Supervision/safety;Set up   Upper Body Bathing: Minimal assistance;Bed level   Lower Body Bathing: Total assistance;Bed level   Upper Body Dressing : Minimal assistance;Bed level   Lower Body Dressing: Total assistance;Bed level                 General ADL Comments: PATIENT IS NEEDING EXTENSIVE ASSIST WITH ADLS  CURRENTLY     Vision Baseline Vision/History: Wears glasses Wears Glasses: At all times Patient Visual Report: No change from baseline       Perception     Praxis      Pertinent Vitals/Pain Pain Assessment: 0-10 Pain Score: 2  Pain Location: L LE Pain Descriptors / Indicators: Aching Pain Intervention(s): Premedicated before session     Hand Dominance Right   Extremity/Trunk Assessment Upper Extremity Assessment Upper Extremity Assessment: LUE deficits/detail LUE Deficits / Details: L SHLD FLEX 30 DEGREES AND AAROM IS 90(PATIENT STATES DECREASED L SHLD ROM SINCE FALL)           Communication Communication Communication: No difficulties   Cognition Arousal/Alertness: Awake/alert Behavior During Therapy: WFL for tasks assessed/performed Overall Cognitive Status: No family/caregiver present to determine baseline cognitive functioning                                 General Comments: PATIENT WITH HX OF DEMENTIA   General Comments       Exercises     Shoulder Instructions      Home Living Family/patient expects to be discharged to:: Skilled nursing facility  Prior Functioning/Environment Level of Independence: Independent with assistive device(s)        Comments: PATIENT STATES HE DID NOT ALWAYS NEED CANE        OT Problem List: Decreased activity tolerance;Decreased cognition;Decreased safety awareness;Decreased knowledge of use of DME or AE      OT Treatment/Interventions: Self-care/ADL training;DME and/or AE instruction;Therapeutic activities;Patient/family education    OT Goals(Current goals can be found in the care plan section) Acute Rehab OT Goals Patient Stated Goal: PATIENT IS AN AGREEMENT WITH REHAB PRIOR TO GOING HOME.  OT Goal Formulation: With patient Time For Goal Achievement: 10/09/19 Potential to Achieve Goals: Good  OT Frequency: Min 2X/week   Barriers to D/C:             Co-evaluation              AM-PAC OT "6 Clicks" Daily Activity     Outcome Measure Help from another person eating meals?: None Help from another person taking care of personal grooming?: A Little Help from another person toileting, which includes using toliet, bedpan, or urinal?: Total Help from another person bathing (including washing, rinsing, drying)?: A Lot Help from another person to put on and taking off regular upper body clothing?: A Little Help from another person to put on and taking off regular lower body clothing?: Total 6 Click Score: 14   End of Session Nurse Communication: (OK THERAPY)  Activity Tolerance: Patient tolerated treatment well Patient left: in bed;with call bell/phone within reach;with bed alarm set  OT Visit Diagnosis: Unsteadiness on feet (R26.81);History of falling (Z91.81)                Time: 9030-0923 OT Time Calculation (min): 40 min Charges:  OT General Charges $OT Visit: 1 Visit OT Evaluation $OT Eval Moderate Complexity: 1 Mod OT Treatments $Self Care/Home Management : 8-22 mins $Therapeutic Activity: 3-00 mins   6 CLICKS Keylon Labelle 09/25/2019, 12:46 PM

## 2019-09-26 DIAGNOSIS — S76111A Strain of right quadriceps muscle, fascia and tendon, initial encounter: Principal | ICD-10-CM

## 2019-09-26 LAB — COMPREHENSIVE METABOLIC PANEL
ALT: 25 U/L (ref 0–44)
AST: 48 U/L — ABNORMAL HIGH (ref 15–41)
Albumin: 3.1 g/dL — ABNORMAL LOW (ref 3.5–5.0)
Alkaline Phosphatase: 50 U/L (ref 38–126)
Anion gap: 9 (ref 5–15)
BUN: 33 mg/dL — ABNORMAL HIGH (ref 8–23)
CO2: 29 mmol/L (ref 22–32)
Calcium: 8.7 mg/dL — ABNORMAL LOW (ref 8.9–10.3)
Chloride: 97 mmol/L — ABNORMAL LOW (ref 98–111)
Creatinine, Ser: 1.43 mg/dL — ABNORMAL HIGH (ref 0.61–1.24)
GFR calc Af Amer: 50 mL/min — ABNORMAL LOW (ref >60)
GFR calc non Af Amer: 43 mL/min — ABNORMAL LOW (ref >60)
Glucose, Bld: 139 mg/dL — ABNORMAL HIGH (ref 70–99)
Potassium: 4.6 mmol/L (ref 3.5–5.1)
Sodium: 135 mmol/L (ref 135–145)
Total Bilirubin: 1.1 mg/dL (ref 0.3–1.2)
Total Protein: 6.5 g/dL (ref 6.5–8.1)

## 2019-09-26 LAB — CBC WITH DIFFERENTIAL/PLATELET
Abs Immature Granulocytes: 0.03 10*3/uL (ref 0.00–0.07)
Basophils Absolute: 0 10*3/uL (ref 0.0–0.1)
Basophils Relative: 0 %
Eosinophils Absolute: 0 10*3/uL (ref 0.0–0.5)
Eosinophils Relative: 0 %
HCT: 32.5 % — ABNORMAL LOW (ref 39.0–52.0)
Hemoglobin: 11.1 g/dL — ABNORMAL LOW (ref 13.0–17.0)
Immature Granulocytes: 0 %
Lymphocytes Relative: 8 %
Lymphs Abs: 0.6 10*3/uL — ABNORMAL LOW (ref 0.7–4.0)
MCH: 33.8 pg (ref 26.0–34.0)
MCHC: 34.2 g/dL (ref 30.0–36.0)
MCV: 99.1 fL (ref 80.0–100.0)
Monocytes Absolute: 0.2 10*3/uL (ref 0.1–1.0)
Monocytes Relative: 3 %
Neutro Abs: 6.4 10*3/uL (ref 1.7–7.7)
Neutrophils Relative %: 89 %
Platelets: 250 10*3/uL (ref 150–400)
RBC: 3.28 MIL/uL — ABNORMAL LOW (ref 4.22–5.81)
RDW: 12 % (ref 11.5–15.5)
WBC: 7.2 10*3/uL (ref 4.0–10.5)
nRBC: 0 % (ref 0.0–0.2)

## 2019-09-26 LAB — LACTATE DEHYDROGENASE: LDH: 235 U/L — ABNORMAL HIGH (ref 98–192)

## 2019-09-26 LAB — PROCALCITONIN: Procalcitonin: 0.32 ng/mL

## 2019-09-26 LAB — C-REACTIVE PROTEIN: CRP: 2.7 mg/dL — ABNORMAL HIGH (ref <1.0)

## 2019-09-26 LAB — FERRITIN: Ferritin: 155 ng/mL (ref 24–336)

## 2019-09-26 LAB — FIBRINOGEN: Fibrinogen: 512 mg/dL — ABNORMAL HIGH (ref 210–475)

## 2019-09-26 LAB — D-DIMER, QUANTITATIVE: D-Dimer, Quant: 1.74 ug/mL-FEU — ABNORMAL HIGH (ref 0.00–0.50)

## 2019-09-26 MED ORDER — METHOCARBAMOL 500 MG PO TABS: 500.0000 mg | ORAL_TABLET | Freq: Four times a day (QID) | ORAL | 0 refills | Status: DC | PRN

## 2019-09-26 MED ORDER — ENOXAPARIN SODIUM 30 MG/0.3ML ~~LOC~~ SOLN
30.0000 mg | Freq: Two times a day (BID) | SUBCUTANEOUS | Status: DC
  Administered 2019-09-26 – 2019-10-04 (×16): 30 mg via SUBCUTANEOUS
  Filled 2019-09-26 (×16): qty 0.3

## 2019-09-26 MED ORDER — ENOXAPARIN SODIUM 30 MG/0.3ML ~~LOC~~ SOLN: 30.0000 mg | Freq: Two times a day (BID) | SUBCUTANEOUS | 0 refills | Status: DC

## 2019-09-26 NOTE — Progress Notes (Signed)
Orthopedics Progress Note  Subjective: Patient says that his leg feels fine if he doesn't move it  Objective:  Vitals:   09/25/19 2018 09/26/19 0644  BP: (!) 151/56 (!) 142/86  Pulse: 75 71  Resp: 16 12  Temp: 97.8 F (36.6 C) (!) 97.5 F (36.4 C)  SpO2: 100% 100%    General: Awake and alert  Musculoskeletal: Long leg splint in disrepair and soaked from urine.  Splint removed and wound checked. No drainage and no erythema. Compartments supple. No swelling in the lower leg. Neg Homans  Neurovascularly intact  Lab Results  Component Value Date   WBC 7.2 09/26/2019   HGB 11.1 (L) 09/26/2019   HCT 32.5 (L) 09/26/2019   MCV 99.1 09/26/2019   PLT 250 09/26/2019       Component Value Date/Time   NA 135 09/26/2019 0912   K 4.6 09/26/2019 0912   CL 97 (L) 09/26/2019 0912   CO2 29 09/26/2019 0912   GLUCOSE 139 (H) 09/26/2019 0912   BUN 33 (H) 09/26/2019 0912   CREATININE 1.43 (H) 09/26/2019 0912   CALCIUM 8.7 (L) 09/26/2019 0912   GFRNONAA 43 (L) 09/26/2019 0912   GFRAA 50 (L) 09/26/2019 0912    Lab Results  Component Value Date   INR 1.0 09/23/2019    Assessment/Plan: POD #2 s/p Procedure(s): REPAIR RIGHT QUADRICEP TENDON Long leg cast placed.  Patient will need to be checked for decubitus and pressure sores Follow up with me in two weeks  Doran Heater. Veverly Fells, MD 09/26/2019 12:27 PM

## 2019-09-26 NOTE — TOC Progression Note (Addendum)
Transition of Care [facility]) - Progression Note    Patient Details  Name: Jason Vasquez MRN: [id number] Date of Birth: Jan 18, 1929  [facility] CM/SW Contact  Purcell Mouton, RN Phone Number: 09/26/2019, 2:43 PM  Clinical Narrative:    Pt will transfer to [place]. Pt is active with [facility] Marcie Bal is his CSW), [phone].   Expected Discharge Plan: Skilled Nursing Facility Barriers to Discharge: No Barriers Identified  Expected Discharge Plan and Services Expected Discharge Plan: [place]   Discharge Planning Services: CM Consult                                           Social Determinants of Health (SDOH) Interventions    Readmission Risk Interventions No flowsheet data found.

## 2019-09-26 NOTE — Progress Notes (Signed)
Physical Therapy Treatment Patient Details Name: Jason Vasquez MRN: [id number] DOB: Feb 22, 1929 Today's Date: 09/26/2019    History of Present Illness Pt is 85 yo male s/p fall with R knee pain and quad tendon rupture.  He has history of CKD, COPD, dementia, and HTN.  Upon admission, pt found to be COVID 19 +.  Pt s/p open repair R quadriceps tendon rupture with retinacular repair on 09/24/19. Pt now with long leg cast and NWB status.    PT Comments    Pt tolerated EOB and exercises well.  He was able to scoot with increased cues.  Pt is not safe to attempt standing due to unable to maintain NWB.  Not safe to leave in chair due to confusion and moves around in bed frequently -fearful he would slide out of chair.  His O2 sats are 96% on RA and no SHOB.   Follow Up Recommendations  SNF     Equipment Recommendations  None recommended by PT    Recommendations for Other Services       Precautions / Restrictions Precautions Precautions: Fall Required Braces or Orthoses: Splint/Cast Restrictions RLE Weight Bearing: Non weight bearing    Mobility  Bed Mobility Overal bed mobility: Needs Assistance Bed Mobility: Supine to Sit;Sit to Supine Rolling: Min assist   Supine to sit: Min assist Sit to supine: Min assist   General bed mobility comments: cues and assist for R LE  Transfers Overall transfer level: Needs assistance               General transfer comment: did not attempt sit to stand as pt not able to maintain NWB.  Did practice scooting at EOB. Pt was able to scoot down EOB and back up to [facility] while maintaining NWB - did require signficant verbal and visual cues.  Pt too confused to leave in chair, so did not scoot to chair.  Ambulation/Gait                 Stairs             Wheelchair Mobility    Modified Rankin (Stroke Patients Only)       Balance Overall balance assessment: Needs assistance Sitting-balance support: Feet unsupported;Bilateral  upper extremity supported Sitting balance-Leahy Scale: Fair                                      Cognition Arousal/Alertness: Awake/alert Behavior During Therapy: WFL for tasks assessed/performed Overall Cognitive Status: No family/caregiver present to determine baseline cognitive functioning                                 General Comments: Pt with hx of dementia; only oriented to self      Exercises General Exercises - Lower Extremity Gluteal Sets: AROM;Both;20 reps;Supine Long Arc Quad: AROM;Left;20 reps;Seated Hip Flexion/Marching: AROM;Left;20 reps;Seated    General Comments        Pertinent Vitals/Pain Pain Assessment: No/denies pain    Home Living                      Prior Function            PT Goals (current goals can now be found in the care plan section) Progress towards PT goals: Progressing toward goals    Frequency  Min 5X/week      PT Plan Current plan remains appropriate    Co-evaluation              AM-PAC PT "6 Clicks" Mobility   Outcome Measure  Help needed turning from your back to your side while in a flat bed without using bedrails?: A Little Help needed moving from lying on your back to sitting on the side of a flat bed without using bedrails?: A Little Help needed moving to and from a bed to a chair (including a wheelchair)?: A Lot Help needed standing up from a chair using your arms (e.g., wheelchair or bedside chair)?: A Lot Help needed to walk in hospital room?: Total Help needed climbing 3-5 steps with a railing? : Total 6 Click Score: 12    End of Session   Activity Tolerance: Patient tolerated treatment well Patient left: in bed;with bed alarm set;with call bell/phone within reach Nurse Communication: Mobility status PT Visit Diagnosis: Unsteadiness on feet (R26.81);History of falling (Z91.81);Muscle weakness (generalized) (M62.81)     Time: 9767-3419 PT Time Calculation  (min) (ACUTE ONLY): 27 min  Charges:  $Therapeutic Exercise: 8-22 mins $Therapeutic Activity: 8-22 mins                     Maggie Font, PT Acute Rehab Services Pager [phone] [facility] [phone] [facility] [phone]    Karlton Lemon 09/26/2019, 5:01 PM

## 2019-09-26 NOTE — Progress Notes (Signed)
PROGRESS NOTE    Jason Vasquez  [id number] DOB: [birth year] DOA: 09/23/2019 PCP: Miguel AschoffWilliams, Julie Anne, MD  Outpatient Specialists:   Brief Narrative:  Patient is an 85 year old African-American male with past medical history significant for chronic kidney disease stage III, hyperlipidemia, diabetes mellitus type 2, hypertension, GERD, BPH and dementia.  Patient was admitted to the orthopedic team following a fall.  Apparently, patient had quadricep tendon rupture following the fall.  Patient has undergone surgery.  Hospitalist team was initially asked to assist with chronic medical problem management.  However, patient's COVID-19 came positive and patient's care has been transferred to the hospitalist team.  The plan is to [facility] for further management of the COVID-19 infection.  POD 2 right quadriceps repair with Dr. Ranell PatrickNorris, ortho. Patient was cleared for transfer [facility] by ortho for further COVID management and transferred on 11/13.  Assessment & Plan:   Active Problems:   Quadriceps tendon rupture, right, initial encounter   Quadriceps muscle rupture, right, initial encounter   COVID-19 infection: afebrile and hemodynamically stable on 2 L [place]. Does not wear O2 at home. CXR concerning for bronchitis. -Check inflammatory markers daily -Decadron 6 Mg p.o. once daily for 10 days, completed 2 days -Consider remdesivir  -Zinc sulfate  -Vitamin C  -Supportive care -Resolving. -Monitor closely -Optimize blood pressure control -Transfer to [facility] today, 11/13.   Right complete quadricep tendon rupture: POD 2 with Dr. Ranell PatrickNorris. Long leg cast placed Will need to be checked for decubitus and pressure sores. -Follow up with Ortho in two weeks.  Alzheimer's dementia: -Stable. -No behavioral problems. -Manage expectantly.   Hypertension: -Uncontrolled. -DC chlorthalidone. -Start BiDil. -Continue Irbesartan 300 mg daily -On Diltiazem 420 mg QD -Monitor  closely.   Diabetes mellitus type 2: Not on medication Monitor blood sugar.  DVT prophylaxis: SCD Code Status: Full code Family Communication:  Discussed with daughter over the phone Disposition Plan: [facility] when cleared for the stable orthopedic team    Consultants:   Originally admitted to orthopedic team.  Patient was transferred to the hospitalist service.  Procedures:   Repair of ruptured right quadriceps tendons  09/24/19  Antimicrobials:   Perioperative clindamycin   Subjective: Patient seen and examined at bedside no acute distress and resting comfortably.  No events overnight.  Tolerating diet.  Denies any chest pain, shortness of breath, fever, nausea, vomiting.  Otherwise ROS negative   Objective: Vitals:   09/25/19 1004 09/25/19 1329 09/25/19 2018 09/26/19 0644  BP: (!) 154/91 (!) 174/98 (!) 151/56 (!) 142/86  Pulse:  85 75 71  Resp:  18 16 12   Temp:  98 F (36.7 C) 97.8 F (36.6 C) (!) 97.5 F (36.4 C)  TempSrc:  Oral Oral Oral  SpO2:  100% 100% 100%  Weight:      Height:        Intake/Output Summary (Last 24 hours) at 09/26/2019 1309 Last data filed at 09/26/2019 0130 Gross per 24 hour  Intake 646.92 ml  Output 176 ml  Net 470.92 ml   Filed Weights   09/23/19 2115 09/24/19 0607  Weight: 102.1 kg 95.2 kg    Examination:  General exam: pleasant, calm cooperative Respiratory system: crackles noted bilaterally Cardiovascular system: S1 & S2 heard. Gastrointestinal system: Abdomen is soft and nontender.   Central nervous system: Alert and oriented.    Extremities: Right lower extremity is in cast.  Data Reviewed: I have personally reviewed following labs and imaging studies  CBC: Recent Labs  Lab 09/23/19 1838 09/25/19 0346 09/26/19 0912  WBC 4.0 3.4* 7.2  NEUTROABS 2.1  --  6.4  HGB 12.0* 14.9 11.1*  HCT 36.0* 43.6 32.5*  MCV 101.4* 98.2 99.1  PLT 292 204 250   Basic Metabolic Panel: Recent Labs  Lab  09/23/19 1838 09/24/19 1255 09/25/19 0935 09/26/19 0912  NA 136 135 135 135  K 3.2* 3.1* 4.1 4.6  CL 98 100 98 97*  CO2 29 26 27 29   GLUCOSE 130* 108* 151* 139*  BUN 34* 26* 30* 33*  CREATININE 1.82* 1.37* 1.25* 1.43*  CALCIUM 8.7* 8.7* 8.6* 8.7*  PHOS  --   --  3.5  --    GFR: Estimated Creatinine Clearance: 42.1 mL/min (A) (by C-G formula based on SCr of 1.43 mg/dL (H)). Liver Function Tests: Recent Labs  Lab 09/25/19 0935 09/26/19 0912  AST  --  48*  ALT  --  25  ALKPHOS  --  50  BILITOT  --  1.1  PROT  --  6.5  ALBUMIN 3.2* 3.1*   No results for input(s): LIPASE, AMYLASE in the last 168 hours. No results for input(s): AMMONIA in the last 168 hours. Coagulation Profile: Recent Labs  Lab 09/23/19 1838  INR 1.0   Cardiac Enzymes: No results for input(s): CKTOTAL, CKMB, CKMBINDEX, TROPONINI in the last 168 hours. BNP (last 3 results) No results for input(s): PROBNP in the last 8760 hours. HbA1C: No results for input(s): HGBA1C in the last 72 hours. CBG: No results for input(s): GLUCAP in the last 168 hours. Lipid Profile: No results for input(s): CHOL, HDL, LDLCALC, TRIG, CHOLHDL, LDLDIRECT in the last 72 hours. Thyroid Function Tests: No results for input(s): TSH, T4TOTAL, FREET4, T3FREE, THYROIDAB in the last 72 hours. Anemia Panel: Recent Labs    09/26/19 0912  FERRITIN 155   Urine analysis: No results found for: COLORURINE, APPEARANCEUR, LABSPEC, PHURINE, GLUCOSEU, HGBUR, BILIRUBINUR, KETONESUR, PROTEINUR, UROBILINOGEN, NITRITE, LEUKOCYTESUR Sepsis Labs: @LABRCNTIP (procalcitonin:4,lacticidven:4)  ) Recent Results (from the past 240 hour(s))  SARS CORONAVIRUS 2 (TAT 6-24 HRS) Nasopharyngeal Nasopharyngeal Swab     Status: Abnormal   Collection Time: 09/23/19  9:46 PM   Specimen: Nasopharyngeal Swab  Result Value Ref Range Status   SARS Coronavirus 2 POSITIVE (A) NEGATIVE Final    Comment: RESULT CALLED TO, READ BACK BY AND VERIFIED WITH: B  13/10/20 09/24/2019 T. TYSOR (NOTE) SARS-CoV-2 target nucleic acids are DETECTED. The SARS-CoV-2 RNA is generally detectable in upper and lower respiratory specimens during the acute phase of infection. Positive results are indicative of active infection with SARS-CoV-2. Clinical  correlation with patient history and other diagnostic information is necessary to determine patient infection status. Positive results do  not rule out bacterial infection or co-infection with other viruses. The expected result is Negative. Fact Sheet for Patients: 8295 Fact Sheet for Healthcare Providers: 13/09/2019 This test is not yet approved or cleared by the [place] FDA and  has been authorized for detection and/or diagnosis of SARS-CoV-2 by FDA under an Emergency Use Authorization (EUA). This EUA will remain  in effect (meaning this test can be used ) for the duration of the COVID-19 declaration under Section 564(b)(1) of the Act, 21 U.S.C. section 360bbb-3(b)(1), unless the authorization is terminated or revoked sooner. Performed at [facility], [address], [place], [address] [place]   Surgical pcr screen     Status: None   Collection Time: 09/23/19  9:46 PM  Specimen: Nasal Mucosa; Nasal Swab  Result Value Ref Range Status   MRSA, PCR NEGATIVE NEGATIVE Final   Staphylococcus aureus NEGATIVE NEGATIVE Final    Comment: (NOTE) The Xpert SA Assay (FDA approved for NASAL specimens in patients 55 years of age and older), is one component of a comprehensive surveillance program. It is not intended to diagnose infection nor to guide or monitor treatment. Performed at [facility], [place] [address] [place], [place] [address]          Radiology Studies: No results found.      Scheduled Meds: . atorvastatin  10 mg Oral q1800  . citalopram  10 mg Oral Daily  . dexamethasone   6 mg Oral Daily  . diltiazem  420 mg Oral Daily  . docusate sodium  100 mg Oral BID  . donepezil  10 mg Oral Daily  . famotidine  10 mg Oral BID  . ferrous sulfate  325 mg Oral TID PC  . finasteride  5 mg Oral QHS  . fluticasone  1 spray Each Nare Daily  . irbesartan  300 mg Oral Daily  . isosorbide-hydrALAZINE  1 tablet Oral TID  . pantoprazole  80 mg Oral Daily  . sodium chloride flush  10-40 mL Intracatheter Q12H  . traZODone  25 mg Oral QHS   Continuous Infusions:    LOS: 3 days    Time spent: 25 minutes    Marva Panda, DO [facility] Pager #: [phone] 7PM-7AM contact night coverage as above

## 2019-09-27 DIAGNOSIS — L8990 Pressure ulcer of unspecified site, unspecified stage: Secondary | ICD-10-CM | POA: Insufficient documentation

## 2019-09-27 DIAGNOSIS — N183 Chronic kidney disease, stage 3 unspecified: Secondary | ICD-10-CM

## 2019-09-27 DIAGNOSIS — N400 Enlarged prostate without lower urinary tract symptoms: Secondary | ICD-10-CM

## 2019-09-27 DIAGNOSIS — E119 Type 2 diabetes mellitus without complications: Secondary | ICD-10-CM

## 2019-09-27 DIAGNOSIS — E785 Hyperlipidemia, unspecified: Secondary | ICD-10-CM

## 2019-09-27 LAB — COMPREHENSIVE METABOLIC PANEL WITH GFR
ALT: 29 U/L (ref 0–44)
AST: 55 U/L — ABNORMAL HIGH (ref 15–41)
Albumin: 2.9 g/dL — ABNORMAL LOW (ref 3.5–5.0)
Alkaline Phosphatase: 49 U/L (ref 38–126)
Anion gap: 10 (ref 5–15)
BUN: 37 mg/dL — ABNORMAL HIGH (ref 8–23)
CO2: 27 mmol/L (ref 22–32)
Calcium: 8.6 mg/dL — ABNORMAL LOW (ref 8.9–10.3)
Chloride: 97 mmol/L — ABNORMAL LOW (ref 98–111)
Creatinine, Ser: 1.46 mg/dL — ABNORMAL HIGH (ref 0.61–1.24)
GFR calc Af Amer: 49 mL/min — ABNORMAL LOW
GFR calc non Af Amer: 42 mL/min — ABNORMAL LOW
Glucose, Bld: 128 mg/dL — ABNORMAL HIGH (ref 70–99)
Potassium: 3.9 mmol/L (ref 3.5–5.1)
Sodium: 134 mmol/L — ABNORMAL LOW (ref 135–145)
Total Bilirubin: 0.6 mg/dL (ref 0.3–1.2)
Total Protein: 6.1 g/dL — ABNORMAL LOW (ref 6.5–8.1)

## 2019-09-27 LAB — LACTATE DEHYDROGENASE: LDH: 244 U/L — ABNORMAL HIGH (ref 98–192)

## 2019-09-27 LAB — GLUCOSE, CAPILLARY
Glucose-Capillary: 105 mg/dL — ABNORMAL HIGH (ref 70–99)
Glucose-Capillary: 146 mg/dL — ABNORMAL HIGH (ref 70–99)
Glucose-Capillary: 182 mg/dL — ABNORMAL HIGH (ref 70–99)
Glucose-Capillary: 129 mg/dL — ABNORMAL HIGH (ref 70–99)

## 2019-09-27 LAB — CBC WITH DIFFERENTIAL/PLATELET
Abs Immature Granulocytes: 0.03 K/uL (ref 0.00–0.07)
Basophils Absolute: 0 K/uL (ref 0.0–0.1)
Basophils Relative: 0 %
Eosinophils Absolute: 0 K/uL (ref 0.0–0.5)
Eosinophils Relative: 0 %
HCT: 30.8 % — ABNORMAL LOW (ref 39.0–52.0)
Hemoglobin: 10.5 g/dL — ABNORMAL LOW (ref 13.0–17.0)
Immature Granulocytes: 0 %
Lymphocytes Relative: 8 %
Lymphs Abs: 0.7 K/uL (ref 0.7–4.0)
MCH: 34 pg (ref 26.0–34.0)
MCHC: 34.1 g/dL (ref 30.0–36.0)
MCV: 99.7 fL (ref 80.0–100.0)
Monocytes Absolute: 0.4 K/uL (ref 0.1–1.0)
Monocytes Relative: 5 %
Neutro Abs: 7.6 K/uL (ref 1.7–7.7)
Neutrophils Relative %: 87 %
Platelets: 253 K/uL (ref 150–400)
RBC: 3.09 MIL/uL — ABNORMAL LOW (ref 4.22–5.81)
RDW: 12 % (ref 11.5–15.5)
WBC: 8.7 K/uL (ref 4.0–10.5)
nRBC: 0 % (ref 0.0–0.2)

## 2019-09-27 LAB — FERRITIN: Ferritin: 183 ng/mL (ref 24–336)

## 2019-09-27 LAB — PROCALCITONIN: Procalcitonin: 0.25 ng/mL

## 2019-09-27 LAB — SAMPLE TO BLOOD BANK

## 2019-09-27 LAB — C-REACTIVE PROTEIN: CRP: 2.8 mg/dL — ABNORMAL HIGH (ref <1.0)

## 2019-09-27 LAB — D-DIMER, QUANTITATIVE: D-Dimer, Quant: 1.6 ug/mL-FEU — ABNORMAL HIGH (ref 0.00–0.50)

## 2019-09-27 LAB — HEMOGLOBIN A1C
Hgb A1c MFr Bld: 6.4 % — ABNORMAL HIGH (ref 4.8–5.6)
Mean Plasma Glucose: 136.98 mg/dL

## 2019-09-27 LAB — FIBRINOGEN: Fibrinogen: 546 mg/dL — ABNORMAL HIGH (ref 210–475)

## 2019-09-27 MED ORDER — INSULIN ASPART 100 UNIT/ML ~~LOC~~ SOLN
0.0000 [IU] | Freq: Three times a day (TID) | SUBCUTANEOUS | Status: DC
  Administered 2019-09-27: 2 [IU] via SUBCUTANEOUS
  Administered 2019-09-27 – 2019-09-30 (×4): 1 [IU] via SUBCUTANEOUS
  Administered 2019-09-30: 3 [IU] via SUBCUTANEOUS
  Administered 2019-10-01 (×2): 2 [IU] via SUBCUTANEOUS

## 2019-09-27 MED ORDER — HALOPERIDOL LACTATE 5 MG/ML IJ SOLN
2.0000 mg | Freq: Four times a day (QID) | INTRAMUSCULAR | Status: DC | PRN
  Administered 2019-09-27: 2 mg via INTRAVENOUS
  Filled 2019-09-27: qty 1

## 2019-09-27 MED ORDER — SENNOSIDES-DOCUSATE SODIUM 8.6-50 MG PO TABS: 2.0000 | ORAL_TABLET | Freq: Every evening | ORAL | Status: DC | PRN

## 2019-09-27 MED ORDER — INSULIN ASPART 100 UNIT/ML ~~LOC~~ SOLN: 0.0000 [IU] | Freq: Every day | SUBCUTANEOUS | Status: DC

## 2019-09-27 MED ORDER — IPRATROPIUM-ALBUTEROL 20-100 MCG/ACT IN AERS
1.0000 | INHALATION_SPRAY | Freq: Four times a day (QID) | RESPIRATORY_TRACT | Status: DC
  Administered 2019-09-27 – 2019-10-04 (×26): 1 via RESPIRATORY_TRACT
  Filled 2019-09-27: qty 4

## 2019-09-27 MED ORDER — SODIUM CHLORIDE 0.9 % IV SOLN
200.0000 mg | Freq: Once | INTRAVENOUS | Status: AC
  Administered 2019-09-27: 200 mg via INTRAVENOUS
  Filled 2019-09-27: qty 40

## 2019-09-27 MED ORDER — SODIUM CHLORIDE 0.9 % IV SOLN
100.0000 mg | INTRAVENOUS | Status: AC
  Administered 2019-09-28 – 2019-10-01 (×4): 100 mg via INTRAVENOUS
  Filled 2019-09-27 (×4): qty 100

## 2019-09-27 NOTE — Progress Notes (Signed)
PROGRESS NOTE    Jason Vasquez  GNF:[id number] DOB: March 11, 1929 DOA: 09/23/2019 PCP: Miguel Aschoff, MD   Brief Narrative:  85 year old with history of CKD stage III, HLD, DM2, HTN, GERD, BPH, dementia admitted by orthopedic team following a fall found to have quadricep tendon rupture.  Underwent surgery and hospitalist consulted but due to COVID-19, patient was transferred to [facility] under hospitalist service.   Assessment & Plan:   Active Problems:   Quadriceps tendon rupture, right, initial encounter   Quadriceps muscle rupture, right, initial encounter   CKD (chronic kidney disease), stage III   HLD (hyperlipidemia)   DM2 (diabetes mellitus, type 2) (HCC)   BPH (benign prostatic hyperplasia)  COVID-19 infection with hypoxia, initially 2 L nasal cannula -Oxygen levels-now back on room air.  Does not use any oxygen at home -Remdesivir-D1 - due to infiltrates.  -PO Decadron-day 3 -Routine: Labs have been reviewed including ferritin, LDH, CRP, d-dimer, fibrinogen.  Will need to trend this lab daily. -Vitamin C & Zinc. Prone >16hrs/day.  -procalcitonin-0.2 -Chest x-ray-signs of acute bronchitis -Supportive care-antitussive, inhalers, I-S/flutter -CODE STATUS confirmed  Right complete quadricep tendon rupture -Status post open repair with retinacular repair 11/12.  Follow-up orthopedic, Dr. Devonne Doughty outpatient in 2 weeks -Long-leg cast placed  History of Alzheimer's dementia, stable -Supportive care.  Continue home meds  Essential hypertension -Cardizem 420 mg daily, irbesartan 300 mg daily, BiDil 3 times daily  Diet controlled DM2 -Monitor blood glucose.  Insulin sliding scale and Accu-Chek.  GERD -Pepcid and Protonix  BPH -On Proscar  DVT prophylaxis: Lovenox Code Status: Full code Family Communication:  Called Henretta; no answer.  Disposition Plan: TBD   Subjective: No complaints, feels ok.   Review of Systems Otherwise negative except as  per HPI, including: General = no fevers, chills, dizziness, malaise, fatigue HEENT/EYES = negative for pain, redness, loss of vision, double vision, blurred vision, loss of hearing, sore throat, hoarseness, dysphagia Cardiovascular= negative for chest pain, palpitation, murmurs, lower extremity swelling Respiratory/lungs= negative for shortness of breath, cough, hemoptysis, wheezing, mucus production Gastrointestinal= negative for nausea, vomiting,, abdominal pain, melena, hematemesis Genitourinary= negative for Dysuria, Hematuria, Change in Urinary Frequency MSK = Negative for arthralgia, myalgias, Back Pain, Joint swelling  Neurology= Negative for headache, seizures, numbness, tingling  Psychiatry= Negative for anxiety, depression, suicidal and homocidal ideation Allergy/Immunology= Medication/Food allergy as listed  Skin= Negative for Rash, lesions, ulcers, itching  Objective: Vitals:   09/26/19 0644 09/26/19 1358 09/26/19 2107 09/27/19 0500  BP: (!) 142/86 134/77 (!) 142/62 (!) (P) 145/71  Pulse: 71 88 (!) 58 (P) 74  Resp: 12 19 18  (P) 18  Temp: (!) 97.5 F (36.4 C) 98.2 F (36.8 C) 97.8 F (36.6 C) (P) 98.3 F (36.8 C)  TempSrc: Oral Oral Oral (P) Oral  SpO2: 100% 100% 98% (P) 96%  Weight:      Height:        Intake/Output Summary (Last 24 hours) at 09/27/2019 0731 Last data filed at 09/26/2019 2200 Gross per 24 hour  Intake 220 ml  Output -  Net 220 ml   Filed Weights   09/23/19 2115 09/24/19 0607  Weight: 102.1 kg 95.2 kg    Examination:  General exam: Appears calm and comfortable ; elderly frail  Respiratory system: bibasilar rhonchi.  Cardiovascular system: S1 & S2 heard, RRR. No JVD, murmurs, rubs, gallops or clicks. No pedal edema. Gastrointestinal system: Abdomen is nondistended, soft and nontender. No organomegaly or masses felt. Normal bowel sounds heard.  Central nervous system: Alert and oriented. No focal neurological deficits. Extremities: Symmetric 5  x 5 power. Skin: No rashes, lesions or ulcers Psychiatry: Judgement and insight appear normal. Mood & affect appropriate.     Data Reviewed:   CBC: Recent Labs  Lab 09/23/19 1838 09/25/19 0346 09/26/19 0912 09/27/19 0424  WBC 4.0 3.4* 7.2 8.7  NEUTROABS 2.1  --  6.4 7.6  HGB 12.0* 14.9 11.1* 10.5*  HCT 36.0* 43.6 32.5* 30.8*  MCV 101.4* 98.2 99.1 99.7  PLT 292 204 250 161   Basic Metabolic Panel: Recent Labs  Lab 09/23/19 1838 09/24/19 1255 09/25/19 0935 09/26/19 0912 09/27/19 0424  NA 136 135 135 135 134*  K 3.2* 3.1* 4.1 4.6 3.9  CL 98 100 98 97* 97*  CO2 29 26 27 29 27   GLUCOSE 130* 108* 151* 139* 128*  BUN 34* 26* 30* 33* 37*  CREATININE 1.82* 1.37* 1.25* 1.43* 1.46*  CALCIUM 8.7* 8.7* 8.6* 8.7* 8.6*  PHOS  --   --  3.5  --   --    GFR: Estimated Creatinine Clearance: 41.2 mL/min (A) (by C-G formula based on SCr of 1.46 mg/dL (H)). Liver Function Tests: Recent Labs  Lab 09/25/19 0935 09/26/19 0912 09/27/19 0424  AST  --  48* 55*  ALT  --  25 29  ALKPHOS  --  50 49  BILITOT  --  1.1 0.6  PROT  --  6.5 6.1*  ALBUMIN 3.2* 3.1* 2.9*   No results for input(s): LIPASE, AMYLASE in the last 168 hours. No results for input(s): AMMONIA in the last 168 hours. Coagulation Profile: Recent Labs  Lab 09/23/19 1838  INR 1.0   Cardiac Enzymes: No results for input(s): CKTOTAL, CKMB, CKMBINDEX, TROPONINI in the last 168 hours. BNP (last 3 results) No results for input(s): PROBNP in the last 8760 hours. HbA1C: No results for input(s): HGBA1C in the last 72 hours. CBG: No results for input(s): GLUCAP in the last 168 hours. Lipid Profile: No results for input(s): CHOL, HDL, LDLCALC, TRIG, CHOLHDL, LDLDIRECT in the last 72 hours. Thyroid Function Tests: No results for input(s): TSH, T4TOTAL, FREET4, T3FREE, THYROIDAB in the last 72 hours. Anemia Panel: Recent Labs    09/26/19 0912 09/27/19 0424  FERRITIN 155 183   Sepsis Labs: Recent Labs  Lab  09/25/19 1730 09/26/19 0912 09/27/19 0424  PROCALCITON 0.34 0.32 0.25    Recent Results (from the past 240 hour(s))  SARS CORONAVIRUS 2 (TAT 6-24 HRS) Nasopharyngeal Nasopharyngeal Swab     Status: Abnormal   Collection Time: 09/23/19  9:46 PM   Specimen: Nasopharyngeal Swab  Result Value Ref Range Status   SARS Coronavirus 2 POSITIVE (A) NEGATIVE Final    Comment: RESULT CALLED TO, READ BACK BY AND VERIFIED WITH: BDonnamarie Poag  0960 09/24/2019 T. TYSOR (NOTE) SARS-CoV-2 target nucleic acids are DETECTED. The SARS-CoV-2 RNA is generally detectable in upper and lower respiratory specimens during the acute phase of infection. Positive results are indicative of active infection with SARS-CoV-2. Clinical  correlation with patient history and other diagnostic information is necessary to determine patient infection status. Positive results do  not rule out bacterial infection or co-infection with other viruses. The expected result is Negative. Fact Sheet for Patients: [employer] Fact Sheet for Healthcare Providers: https://www.woods-mathews.com/ This test is not yet approved or cleared by the [place] FDA and  has been authorized for detection and/or diagnosis of SARS-CoV-2 by FDA under an Emergency Use Authorization (EUA). This EUA will  remain  in effect (meaning this test can be used ) for the duration of the COVID-19 declaration under Section 564(b)(1) of the Act, 21 U.S.C. section 360bbb-3(b)(1), unless the authorization is terminated or revoked sooner. Performed at [facility], [address] [place], [place] [address]   Surgical pcr screen     Status: None   Collection Time: 09/23/19  9:46 PM   Specimen: Nasal Mucosa; Nasal Swab  Result Value Ref Range Status   MRSA, PCR NEGATIVE NEGATIVE Final   Staphylococcus aureus NEGATIVE NEGATIVE Final    Comment: (NOTE) The Xpert SA Assay (FDA approved for NASAL specimens  in patients 83 years of age and older), is one component of a comprehensive surveillance program. It is not intended to diagnose infection nor to guide or monitor treatment. Performed at [facility], [address] [place], [place] [address]          Radiology Studies: No results found.      Scheduled Meds: . atorvastatin  10 mg Oral q1800  . citalopram  10 mg Oral Daily  . dexamethasone  6 mg Oral Daily  . diltiazem  420 mg Oral Daily  . docusate sodium  100 mg Oral BID  . donepezil  10 mg Oral Daily  . enoxaparin (LOVENOX) injection  30 mg Subcutaneous Q12H  . famotidine  10 mg Oral BID  . ferrous sulfate  325 mg Oral TID PC  . finasteride  5 mg Oral QHS  . fluticasone  1 spray Each Nare Daily  . irbesartan  300 mg Oral Daily  . isosorbide-hydrALAZINE  1 tablet Oral TID  . pantoprazole  80 mg Oral Daily  . sodium chloride flush  10-40 mL Intracatheter Q12H  . traZODone  25 mg Oral QHS   Continuous Infusions:   LOS: 4 days   Time spent= 35 mins    Ankit Joline Maxcyhirag Amin, MD [facility]  If 7PM-7AM, please contact night-coverage  09/27/2019, 7:31 AM

## 2019-09-27 NOTE — Progress Notes (Signed)
Assisted patient back to bed with maximove lift.  Pt remains very confused, demanding to speak to his spouse (deceased).  Multiple calls from telesitter for pt attempting to get out of chair and removing equipment.  Telemetry order [place] per physician.  Spot O2 monitoring with vitals.  Pt refused lunch d/t irritability.  This RN updated family on plan of care and assisted patient to speak to family.  Patient is now resting comfortably in bed.  Assisted patient to drink an Ensure.  Q2H turns in place, heels elevated off of bed.  Condom cath in place.  Will continue to monitor.

## 2019-09-27 NOTE — Progress Notes (Signed)
Pt increasingly confused and agitated throughout the day.  Reorientation and diversion attempts unsuccessful.  [place]ens applied d/t pt removing equipment.  Pt aggressive and noncompliant when staff attempts to reposition or perform peri-care.  Physician notified.  Orders for PRN haldol received.

## 2019-09-27 NOTE — Progress Notes (Signed)
Pt arrived via [facility].  Report called again.  Oriented to room and unit.  Will continue to monitor

## 2019-09-27 NOTE — Progress Notes (Signed)
[facility] arrived to transport pt to [facility]. Report given to receiving RN. Related paperwork given to transporters. Pt stable upon departure.

## 2019-09-28 DIAGNOSIS — R410 Disorientation, unspecified: Secondary | ICD-10-CM

## 2019-09-28 DIAGNOSIS — N400 Enlarged prostate without lower urinary tract symptoms: Secondary | ICD-10-CM

## 2019-09-28 LAB — BASIC METABOLIC PANEL
Anion gap: 13 (ref 5–15)
BUN: 47 mg/dL — ABNORMAL HIGH (ref 8–23)
CO2: 23 mmol/L (ref 22–32)
Calcium: 8.6 mg/dL — ABNORMAL LOW (ref 8.9–10.3)
Chloride: 100 mmol/L (ref 98–111)
Creatinine, Ser: 1.65 mg/dL — ABNORMAL HIGH (ref 0.61–1.24)
GFR calc Af Amer: 42 mL/min — ABNORMAL LOW (ref >60)
GFR calc non Af Amer: 37 mL/min — ABNORMAL LOW (ref >60)
Glucose, Bld: 115 mg/dL — ABNORMAL HIGH (ref 70–99)
Potassium: 4.5 mmol/L (ref 3.5–5.1)
Sodium: 136 mmol/L (ref 135–145)

## 2019-09-28 LAB — CBC
HCT: 30.7 % — ABNORMAL LOW (ref 39.0–52.0)
Hemoglobin: 10.6 g/dL — ABNORMAL LOW (ref 13.0–17.0)
MCH: 33.7 pg (ref 26.0–34.0)
MCHC: 34.5 g/dL (ref 30.0–36.0)
MCV: 97.5 fL (ref 80.0–100.0)
Platelets: 250 10*3/uL (ref 150–400)
RBC: 3.15 MIL/uL — ABNORMAL LOW (ref 4.22–5.81)
RDW: 11.9 % (ref 11.5–15.5)
WBC: 11 10*3/uL — ABNORMAL HIGH (ref 4.0–10.5)
nRBC: 0 % (ref 0.0–0.2)

## 2019-09-28 LAB — GLUCOSE, CAPILLARY
Glucose-Capillary: 129 mg/dL — ABNORMAL HIGH (ref 70–99)
Glucose-Capillary: 117 mg/dL — ABNORMAL HIGH (ref 70–99)
Glucose-Capillary: 106 mg/dL — ABNORMAL HIGH (ref 70–99)
Glucose-Capillary: 88 mg/dL (ref 70–99)

## 2019-09-28 LAB — D-DIMER, QUANTITATIVE: D-Dimer, Quant: 1.15 ug/mL-FEU — ABNORMAL HIGH (ref 0.00–0.50)

## 2019-09-28 LAB — FERRITIN: Ferritin: 160 ng/mL (ref 24–336)

## 2019-09-28 LAB — MAGNESIUM: Magnesium: 2 mg/dL (ref 1.7–2.4)

## 2019-09-28 LAB — C-REACTIVE PROTEIN: CRP: 2.3 mg/dL — ABNORMAL HIGH (ref <1.0)

## 2019-09-28 LAB — LACTATE DEHYDROGENASE: LDH: 378 U/L — ABNORMAL HIGH (ref 98–192)

## 2019-09-28 MED ORDER — HALOPERIDOL LACTATE 5 MG/ML IJ SOLN: 5.0000 mg | Freq: Four times a day (QID) | INTRAMUSCULAR | Status: AC | PRN
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
## 2019-09-28 NOTE — Progress Notes (Signed)
Family called patient's room phone while RN in the room performing pt care.  Assisted the patient to answer telephone.  Pt fell asleep while talking to family.  Physician informed of patient's status.  PRN ativan discontinued and haldol increased from 2 mg to 5mg . No sedating meds given on dayshift.  Orders to hold PO intake until patient is more awake/alert d/t concerns for aspiration risk.  Will continue to monitor closely.  Frequent oral care and Q2H turning in place.

## 2019-09-28 NOTE — Progress Notes (Signed)
Patient's daughter, Robet Leu, updated by telephone on patient condition and plan of care.  All questions welcomed and answered.

## 2019-09-28 NOTE — Progress Notes (Signed)
PROGRESS NOTE    Jason Vasquez  RKY:[id number] DOB: 1928/11/29 DOA: 09/23/2019 PCP: Angelica Pou, MD   Brief Narrative:  85 year old with history of CKD stage III, HLD, DM2, HTN, GERD, BPH, dementia admitted by orthopedic team following a fall found to have quadricep tendon rupture.  Underwent surgery and hospitalist consulted but due to COVID-19, patient was transferred to [facility] under hospitalist service.   Assessment & Plan:   Active Problems:   Quadriceps tendon rupture, right, initial encounter   Quadriceps muscle rupture, right, initial encounter   CKD (chronic kidney disease), stage III   HLD (hyperlipidemia)   DM2 (diabetes mellitus, type 2) (HCC)   BPH (benign prostatic hyperplasia)   Pressure injury of skin   Delirium  COVID-19 infection with hypoxia, initially 2 L nasal cannula -Oxygen levels-now on room air -Remdesivir-D2 - due to infiltrates.  -PO Decadron-day 4 -Routine: Labs have been reviewed including ferritin, LDH, CRP, d-dimer, fibrinogen.  Will need to trend this lab daily. -Vitamin C & Zinc. Prone >16hrs/day.  -procalcitonin-0.2 -Chest x-ray-signs of acute bronchitis -Supportive care-antitussive, inhalers, I-S/flutter -CODE STATUS confirmed  Delirium/agitation -Suspect secondary to hospitalization and acute underlying illness.  EKG is unremarkable, as needed Haldol, restraints and telemetry sitter.  Right complete quadricep tendon rupture -Status post open repair with retinacular repair 11/12.  Follow-up orthopedic, Dr. Alma Friendly outpatient in 2 weeks -Long-leg cast placed  History of Alzheimer's dementia, stable -Supportive care.  Continue home meds  Essential hypertension -Cardizem 420 mg daily, irbesartan 300 mg daily, BiDil 3 times daily  Diet controlled DM2 -Monitor blood glucose.  Insulin sliding scale and Accu-Chek.  GERD -Pepcid and Protonix  BPH -On Proscar  DVT prophylaxis: Lovenox Code Status: Full code Family  Communication:  Called Henretta; she is updated.  Disposition Plan: Peripheralization to complete IV remdesivir treatment.   Subjective: Yesterday he became agitated and confused.  He required Haldol and had to be placed in restraints. When I entered the room this morning he was resting but was in restraints. No complaints at the moment.   Review of Systems Otherwise negative except as per HPI, including: General = no fevers, chills, dizziness, malaise, fatigue HEENT/EYES = negative for pain, redness, loss of vision, double vision, blurred vision, loss of hearing, sore throat, hoarseness, dysphagia Cardiovascular= negative for chest pain, palpitation, murmurs, lower extremity swelling Respiratory/lungs= negative for shortness of breath, cough, hemoptysis, wheezing, mucus production Gastrointestinal= negative for nausea, vomiting,, abdominal pain, melena, hematemesis Genitourinary= negative for Dysuria, Hematuria, Change in Urinary Frequency MSK = Negative for arthralgia, myalgias, Back Pain, Joint swelling  Neurology= Negative for headache, seizures, numbness, tingling  Psychiatry= Negative for anxiety, depression, suicidal and homocidal ideation Allergy/Immunology= Medication/Food allergy as listed  Skin= Negative for Rash, lesions, ulcers, itching   Objective: Vitals:   09/27/19 2002 09/28/19 0046 09/28/19 0456 09/28/19 0700  BP: 134/66 (!) 91/58 (!) 141/69 139/80  Pulse: 66 69 74 69  Resp: 15 16 15 16   Temp: (!) 97.5 F (36.4 C) (!) 97.1 F (36.2 C) 97.6 F (36.4 C) 97.8 F (36.6 C)  TempSrc: Oral Axillary Axillary Axillary  SpO2: (!) 88% 92% 97% 95%  Weight:      Height:        Intake/Output Summary (Last 24 hours) at 09/28/2019 1035 Last data filed at 09/28/2019 0932 Gross per 24 hour  Intake 310 ml  Output 150 ml  Net 160 ml   Filed Weights   09/23/19 2115 09/24/19 0607 09/27/19 0500  Weight: 102.1  kg 95.2 kg 98.9 kg    Examination: Constitutional: Not in  acute distress; resting with his eyes closed. Currently in 2 points in restraints.  Respiratory: minimal bibasilar crackles.  Cardiovascular: Normal sinus rhythm, no rubs Abdomen: Nontender nondistended good bowel sounds Musculoskeletal: No edema noted Skin: No rashes seen Neurologic: CN 2-12 grossly intact.  And non-focal; grossly moves all the extremities.  Psychiatric: Poor judgment and insight.   Data Reviewed:   CBC: Recent Labs  Lab 09/23/19 1838 09/25/19 0346 09/26/19 0912 09/27/19 0424 09/28/19 0158  WBC 4.0 3.4* 7.2 8.7 11.0*  NEUTROABS 2.1  --  6.4 7.6  --   HGB 12.0* 14.9 11.1* 10.5* 10.6*  HCT 36.0* 43.6 32.5* 30.8* 30.7*  MCV 101.4* 98.2 99.1 99.7 97.5  PLT 292 204 250 253 250   Basic Metabolic Panel: Recent Labs  Lab 09/24/19 1255 09/25/19 0935 09/26/19 0912 09/27/19 0424 09/28/19 0158  NA 135 135 135 134* 136  K 3.1* 4.1 4.6 3.9 4.5  CL 100 98 97* 97* 100  CO2 26 27 29 27 23   GLUCOSE 108* 151* 139* 128* 115*  BUN 26* 30* 33* 37* 47*  CREATININE 1.37* 1.25* 1.43* 1.46* 1.65*  CALCIUM 8.7* 8.6* 8.7* 8.6* 8.6*  MG  --   --   --   --  2.0  PHOS  --  3.5  --   --   --    GFR: Estimated Creatinine Clearance: 37.1 mL/min (A) (by C-G formula based on SCr of 1.65 mg/dL (H)). Liver Function Tests: Recent Labs  Lab 09/25/19 0935 09/26/19 0912 09/27/19 0424  AST  --  48* 55*  ALT  --  25 29  ALKPHOS  --  50 49  BILITOT  --  1.1 0.6  PROT  --  6.5 6.1*  ALBUMIN 3.2* 3.1* 2.9*   No results for input(s): LIPASE, AMYLASE in the last 168 hours. No results for input(s): AMMONIA in the last 168 hours. Coagulation Profile: Recent Labs  Lab 09/23/19 1838  INR 1.0   Cardiac Enzymes: No results for input(s): CKTOTAL, CKMB, CKMBINDEX, TROPONINI in the last 168 hours. BNP (last 3 results) No results for input(s): PROBNP in the last 8760 hours. HbA1C: Recent Labs    09/27/19 0800  HGBA1C 6.4*   CBG: Recent Labs  Lab 09/27/19 0821 09/27/19 1208  09/27/19 1622 09/27/19 2247 09/28/19 0717  GLUCAP 105* 146* 182* 129* 129*   Lipid Profile: No results for input(s): CHOL, HDL, LDLCALC, TRIG, CHOLHDL, LDLDIRECT in the last 72 hours. Thyroid Function Tests: No results for input(s): TSH, T4TOTAL, FREET4, T3FREE, THYROIDAB in the last 72 hours. Anemia Panel: Recent Labs    09/27/19 0424 09/28/19 0158  FERRITIN 183 160   Sepsis Labs: Recent Labs  Lab 09/25/19 1730 09/26/19 0912 09/27/19 0424  PROCALCITON 0.34 0.32 0.25    Recent Results (from the past 240 hour(s))  SARS CORONAVIRUS 2 (TAT 6-24 HRS) Nasopharyngeal Nasopharyngeal Swab     Status: Abnormal   Collection Time: 09/23/19  9:46 PM   Specimen: Nasopharyngeal Swab  Result Value Ref Range Status   SARS Coronavirus 2 POSITIVE (A) NEGATIVE Final    Comment: RESULT CALLED TO, READ BACK BY AND VERIFIED WITH: B13/10/20  Jacquelynn Cree 09/24/2019 T. TYSOR (NOTE) SARS-CoV-2 target nucleic acids are DETECTED. The SARS-CoV-2 RNA is generally detectable in upper and lower respiratory specimens during the acute phase of infection. Positive results are indicative of active infection with SARS-CoV-2. Clinical  correlation with patient history  and other diagnostic information is necessary to determine patient infection status. Positive results do  not rule out bacterial infection or co-infection with other viruses. The expected result is Negative. Fact Sheet for Patients: HairSlick.nohttps://www.fda.gov/media/138098/download Fact Sheet for Healthcare Providers: quierodirigir.comhttps://www.fda.gov/media/138095/download This test is not yet approved or cleared by the [place] FDA and  has been authorized for detection and/or diagnosis of SARS-CoV-2 by FDA under an Emergency Use Authorization (EUA). This EUA will remain  in effect (meaning this test can be used ) for the duration of the COVID-19 declaration under Section 564(b)(1) of the Act, 21 U.S.C. section 360bbb-3(b)(1), unless the authorization  is terminated or revoked sooner. Performed at [facility], [address] [place], [place] [address]   Surgical pcr screen     Status: None   Collection Time: 09/23/19  9:46 PM   Specimen: Nasal Mucosa; Nasal Swab  Result Value Ref Range Status   MRSA, PCR NEGATIVE NEGATIVE Final   Staphylococcus aureus NEGATIVE NEGATIVE Final    Comment: (NOTE) The Xpert SA Assay (FDA approved for NASAL specimens in patients 83 years of age and older), is one component of a comprehensive surveillance program. It is not intended to diagnose infection nor to guide or monitor treatment. Performed at [facility], [address] [place], [place] [address]       Radiology Studies: No results found.   Scheduled Meds: . atorvastatin  10 mg Oral q1800  . citalopram  10 mg Oral Daily  . dexamethasone  6 mg Oral Daily  . diltiazem  420 mg Oral Daily  . docusate sodium  100 mg Oral BID  . donepezil  10 mg Oral Daily  . enoxaparin (LOVENOX) injection  30 mg Subcutaneous Q12H  . famotidine  10 mg Oral BID  . ferrous sulfate  325 mg Oral TID PC  . finasteride  5 mg Oral QHS  . fluticasone  1 spray Each Nare Daily  . insulin aspart  0-5 Units Subcutaneous QHS  . insulin aspart  0-9 Units Subcutaneous TID WC  . Ipratropium-Albuterol  1 puff Inhalation Q6H  . irbesartan  300 mg Oral Daily  . isosorbide-hydrALAZINE  1 tablet Oral TID  . pantoprazole  80 mg Oral Daily  . sodium chloride flush  10-40 mL Intracatheter Q12H  . traZODone  25 mg Oral QHS   Continuous Infusions: . remdesivir 100 mg in NS 250 mL       LOS: 5 days   Time spent= 35 mins    Zunairah Devers Joline Maxcyhirag Jezabella Schriever, MD [facility]  If 7PM-7AM, please contact night-coverage  09/28/2019, 10:35 AM

## 2019-09-28 NOTE — Progress Notes (Signed)
Patient family called back upset about unsuccessful attempts to contact patient on room phone.  This RN has checked numerous times that patient's room phone is in working order and within reach of patient.  The times patient has not answered, he has been sleeping or hangs up on family due to confusion while RN is not in the room.  Pt remains oriented to person only.  When awake pt attempts to remove equipment and climb out of bed.  Reorientation unsuccessful. This RN stayed at bedside to help patient dial family phone number to which there was no answer.  Patient is back asleep, resting quietly in bed.  Refused lunch tray. Pt states he "just wants to sleep".  Received report from night shift RN that patient did not sleep last night or the night before.  Relayed this information to family numerous times.  Will try again to contact family with patient when he is awake.  Pt left with bed in low position, floor mats in place, call bell and phone within reach, telesitter in place.  Will continue to monitor.

## 2019-09-29 DIAGNOSIS — U071 COVID-19: Secondary | ICD-10-CM

## 2019-09-29 LAB — GLUCOSE, CAPILLARY
Glucose-Capillary: 75 mg/dL (ref 70–99)
Glucose-Capillary: 150 mg/dL — ABNORMAL HIGH (ref 70–99)
Glucose-Capillary: 137 mg/dL — ABNORMAL HIGH (ref 70–99)
Glucose-Capillary: 168 mg/dL — ABNORMAL HIGH (ref 70–99)

## 2019-09-29 LAB — CBC
HCT: 36.3 % — ABNORMAL LOW (ref 39.0–52.0)
Hemoglobin: 12.4 g/dL — ABNORMAL LOW (ref 13.0–17.0)
MCH: 33.3 pg (ref 26.0–34.0)
MCHC: 34.2 g/dL (ref 30.0–36.0)
MCV: 97.6 fL (ref 80.0–100.0)
Platelets: 249 10*3/uL (ref 150–400)
RBC: 3.72 MIL/uL — ABNORMAL LOW (ref 4.22–5.81)
RDW: 12.1 % (ref 11.5–15.5)
WBC: 10.9 10*3/uL — ABNORMAL HIGH (ref 4.0–10.5)
nRBC: 0 % (ref 0.0–0.2)

## 2019-09-29 LAB — FERRITIN: Ferritin: 234 ng/mL (ref 24–336)

## 2019-09-29 LAB — BASIC METABOLIC PANEL
Anion gap: 13 (ref 5–15)
BUN: 44 mg/dL — ABNORMAL HIGH (ref 8–23)
CO2: 24 mmol/L (ref 22–32)
Calcium: 8.8 mg/dL — ABNORMAL LOW (ref 8.9–10.3)
Chloride: 102 mmol/L (ref 98–111)
Creatinine, Ser: 1.39 mg/dL — ABNORMAL HIGH (ref 0.61–1.24)
GFR calc Af Amer: 52 mL/min — ABNORMAL LOW (ref >60)
GFR calc non Af Amer: 45 mL/min — ABNORMAL LOW (ref >60)
Glucose, Bld: 65 mg/dL — ABNORMAL LOW (ref 70–99)
Potassium: 4.4 mmol/L (ref 3.5–5.1)
Sodium: 139 mmol/L (ref 135–145)

## 2019-09-29 LAB — D-DIMER, QUANTITATIVE: D-Dimer, Quant: 1.13 ug/mL-FEU — ABNORMAL HIGH (ref 0.00–0.50)

## 2019-09-29 LAB — MAGNESIUM: Magnesium: 2 mg/dL (ref 1.7–2.4)

## 2019-09-29 LAB — LACTATE DEHYDROGENASE: LDH: 371 U/L — ABNORMAL HIGH (ref 98–192)

## 2019-09-29 LAB — C-REACTIVE PROTEIN: CRP: 4.3 mg/dL — ABNORMAL HIGH (ref <1.0)

## 2019-09-29 MED ORDER — HALOPERIDOL LACTATE 5 MG/ML IJ SOLN
3.0000 mg | Freq: Four times a day (QID) | INTRAMUSCULAR | Status: DC | PRN
  Administered 2019-09-30 – 2019-10-02 (×3): 3 mg via INTRAVENOUS
  Filled 2019-09-29 (×3): qty 1

## 2019-09-29 MED ORDER — ZINC SULFATE 220 (50 ZN) MG PO CAPS
220.0000 mg | ORAL_CAPSULE | Freq: Every day | ORAL | Status: DC
  Administered 2019-09-30 – 2019-10-03 (×4): 220 mg via ORAL
  Filled 2019-09-29 (×5): qty 1

## 2019-09-29 MED ORDER — VITAMIN C 500 MG PO TABS
500.0000 mg | ORAL_TABLET | Freq: Every day | ORAL | Status: DC
  Administered 2019-09-30 – 2019-10-03 (×4): 500 mg via ORAL
  Filled 2019-09-29 (×4): qty 1

## 2019-09-29 NOTE — Progress Notes (Signed)
PROGRESS NOTE    Jason Vasquez  ZOX:[id number] DOB: Oct 12, 1929 DOA: 09/23/2019 PCP: Miguel Aschoff, MD   Brief Narrative:  85 year old with history of CKD stage III, HLD, DM2, HTN, GERD, BPH, dementia admitted by orthopedic team following a fall found to have quadricep tendon rupture.  Underwent surgery and hospitalist consulted but due to COVID-19, patient was transferred to [facility] under hospitalist service.  Being treated with remdesivir, steroids.  Haldol as needed for agitation.   Assessment & Plan:   Active Problems:   Quadriceps tendon rupture, right, initial encounter   Quadriceps muscle rupture, right, initial encounter   CKD (chronic kidney disease), stage III   HLD (hyperlipidemia)   DM2 (diabetes mellitus, type 2) (HCC)   BPH (benign prostatic hyperplasia)   Pressure injury of skin   Delirium  COVID-19 infection with hypoxia, initially 2 L nasal cannula -Oxygen levels-now on room air -Remdesivir-day 3 due to infiltrate -PO Decadron-day 4 -Routine: Labs have been reviewed including ferritin, LDH, CRP, d-dimer, fibrinogen.  Will need to trend this lab daily. -Vitamin C & Zinc. Prone >16hrs/day.  -procalcitonin-0.2 -Chest x-ray-signs of acute bronchitis -Supportive care-antitussive, inhalers, I-S/flutter -CODE STATUS confirmed  Delirium/agitation -Secondary to hospitalization and acute illness.  As needed Haldol.  Two-point restraint in place for patient and staff safety as he tried to get out of bed.  Geographical information systems officer.  Right complete quadricep tendon rupture -Status post open repair with retinacular repair 11/12.  Follow-up orthopedic, Dr. Devonne Doughty outpatient in 2 weeks -Long-leg cast placed  History of Alzheimer's dementia, stable -Supportive care.  Continue home meds  Essential hypertension -Cardizem 420 mg daily, irbesartan 300 mg daily, BiDil 3 times daily  Diet controlled DM2 -Monitor blood glucose.  Insulin sliding scale and Accu-Chek.   GERD -Pepcid and Protonix  BPH -On Proscar  DVT prophylaxis: Lovenox Code Status: Full code Family Communication:  Theatre stage manager; she did not answer today therefore left her voicemail Disposition Plan: Continue hospitalization to complete IV remdesivir and then skilled nursing facility   Subjective: Had some agitation yesterday but this morning appears to be doing okay.  Denies any complaints but still remains in restraints unless he is trying get out of bed.  Pleasantly confused.  Review of Systems Otherwise negative except as per HPI, including: General = no fevers, chills, dizziness, malaise, fatigue HEENT/EYES = negative for pain, redness, loss of vision, double vision, blurred vision, loss of hearing, sore throat, hoarseness, dysphagia Cardiovascular= negative for chest pain, palpitation, murmurs, lower extremity swelling Respiratory/lungs= negative for shortness of breath, cough, hemoptysis, wheezing, mucus production Gastrointestinal= negative for nausea, vomiting,, abdominal pain, melena, hematemesis Genitourinary= negative for Dysuria, Hematuria, Change in Urinary Frequency MSK = Negative for arthralgia, myalgias, Back Pain, Joint swelling  Neurology= Negative for headache, seizures, numbness, tingling  Psychiatry= Negative for anxiety, depression, suicidal and homocidal ideation Allergy/Immunology= Medication/Food allergy as listed  Skin= Negative for Rash, lesions, ulcers, itching   Objective: Vitals:   09/28/19 0700 09/28/19 1157 09/28/19 1927 09/29/19 0720  BP: 139/80 132/73 (!) 168/68 (!) 155/110  Pulse: 69 85 97 97  Resp: 16 16 18 16   Temp: 97.8 F (36.6 C) 98.3 F (36.8 C) 98.3 F (36.8 C) 98.2 F (36.8 C)  TempSrc: Axillary Axillary Axillary Oral  SpO2: 95% 96% 97% 97%  Weight:      Height:        Intake/Output Summary (Last 24 hours) at 09/29/2019 1051 Last data filed at 09/28/2019 1500 Gross per 24 hour  Intake  250 ml  Output -  Net 250 ml    Filed Weights   09/23/19 2115 09/24/19 0607 09/27/19 0500  Weight: 102.1 kg 95.2 kg 98.9 kg    Examination: Constitutional: Not in acute distress Respiratory: Clear to auscultation bilaterally Cardiovascular: Normal sinus rhythm, no rubs Abdomen: Nontender nondistended good bowel sounds Musculoskeletal: Right lower extremity cast noted Skin: No rashes seen Neurologic: Grossly moving all the extremities.  No focal deficits Psychiatric: Poor judgment and insight.  Only alert to his name.  Somewhat agitated but overall, Currently in two-point restraints.   Data Reviewed:   CBC: Recent Labs  Lab 09/23/19 1838 09/25/19 0346 09/26/19 0912 09/27/19 0424 09/28/19 0158 09/29/19 0308  WBC 4.0 3.4* 7.2 8.7 11.0* 10.9*  NEUTROABS 2.1  --  6.4 7.6  --   --   HGB 12.0* 14.9 11.1* 10.5* 10.6* 12.4*  HCT 36.0* 43.6 32.5* 30.8* 30.7* 36.3*  MCV 101.4* 98.2 99.1 99.7 97.5 97.6  PLT 292 204 250 253 250 249   Basic Metabolic Panel: Recent Labs  Lab 09/25/19 0935 09/26/19 0912 09/27/19 0424 09/28/19 0158 09/29/19 0308  NA 135 135 134* 136 139  K 4.1 4.6 3.9 4.5 4.4  CL 98 97* 97* 100 102  CO2 27 29 27 23 24   GLUCOSE 151* 139* 128* 115* 65*  BUN 30* 33* 37* 47* 44*  CREATININE 1.25* 1.43* 1.46* 1.65* 1.39*  CALCIUM 8.6* 8.7* 8.6* 8.6* 8.8*  MG  --   --   --  2.0 2.0  PHOS 3.5  --   --   --   --    GFR: Estimated Creatinine Clearance: 44 mL/min (A) (by C-G formula based on SCr of 1.39 mg/dL (H)). Liver Function Tests: Recent Labs  Lab 09/25/19 0935 09/26/19 0912 09/27/19 0424  AST  --  48* 55*  ALT  --  25 29  ALKPHOS  --  50 49  BILITOT  --  1.1 0.6  PROT  --  6.5 6.1*  ALBUMIN 3.2* 3.1* 2.9*   No results for input(s): LIPASE, AMYLASE in the last 168 hours. No results for input(s): AMMONIA in the last 168 hours. Coagulation Profile: Recent Labs  Lab 09/23/19 1838  INR 1.0   Cardiac Enzymes: No results for input(s): CKTOTAL, CKMB, CKMBINDEX, TROPONINI in the  last 168 hours. BNP (last 3 results) No results for input(s): PROBNP in the last 8760 hours. HbA1C: Recent Labs    09/27/19 0800  HGBA1C 6.4*   CBG: Recent Labs  Lab 09/28/19 0717 09/28/19 1156 09/28/19 1519 09/28/19 2116 09/29/19 0721  GLUCAP 129* 117* 106* 88 75   Lipid Profile: No results for input(s): CHOL, HDL, LDLCALC, TRIG, CHOLHDL, LDLDIRECT in the last 72 hours. Thyroid Function Tests: No results for input(s): TSH, T4TOTAL, FREET4, T3FREE, THYROIDAB in the last 72 hours. Anemia Panel: Recent Labs    09/28/19 0158 09/29/19 0308  FERRITIN 160 234   Sepsis Labs: Recent Labs  Lab 09/25/19 1730 09/26/19 0912 09/27/19 0424  PROCALCITON 0.34 0.32 0.25    Recent Results (from the past 240 hour(s))  SARS CORONAVIRUS 2 (TAT 6-24 HRS) Nasopharyngeal Nasopharyngeal Swab     Status: Abnormal   Collection Time: 09/23/19  9:46 PM   Specimen: Nasopharyngeal Swab  Result Value Ref Range Status   SARS Coronavirus 2 POSITIVE (A) NEGATIVE Final    Comment: RESULT CALLED TO, READ BACK BY AND VERIFIED WITH: BJacquelynn Cree. [place],RN  [id number] 09/24/2019 T. TYSOR (NOTE) SARS-CoV-2 target nucleic acids are DETECTED.  The SARS-CoV-2 RNA is generally detectable in upper and lower respiratory specimens during the acute phase of infection. Positive results are indicative of active infection with SARS-CoV-2. Clinical  correlation with patient history and other diagnostic information is necessary to determine patient infection status. Positive results do  not rule out bacterial infection or co-infection with other viruses. The expected result is Negative. Fact Sheet for Patients: SugarRoll.be Fact Sheet for Healthcare Providers: https://www.woods-mathews.com/ This test is not yet approved or cleared by the [place] FDA and  has been authorized for detection and/or diagnosis of SARS-CoV-2 by FDA under an Emergency Use Authorization (EUA). This  EUA will remain  in effect (meaning this test can be used ) for the duration of the COVID-19 declaration under Section 564(b)(1) of the Act, 21 U.S.C. section 360bbb-3(b)(1), unless the authorization is terminated or revoked sooner. Performed at [facility], [place] [address], [place], [place] [address]   Surgical pcr screen     Status: None   Collection Time: 09/23/19  9:46 PM   Specimen: Nasal Mucosa; Nasal Swab  Result Value Ref Range Status   MRSA, PCR NEGATIVE NEGATIVE Final   Staphylococcus aureus NEGATIVE NEGATIVE Final    Comment: (NOTE) The Xpert SA Assay (FDA approved for NASAL specimens in patients 52 years of age and older), is one component of a comprehensive surveillance program. It is not intended to diagnose infection nor to guide or monitor treatment. Performed at [facility], [place] [address], [place], [place] [address]       Radiology Studies: No results found.   Scheduled Meds: . atorvastatin  10 mg Oral q1800  . citalopram  10 mg Oral Daily  . dexamethasone  6 mg Oral Daily  . diltiazem  420 mg Oral Daily  . docusate sodium  100 mg Oral BID  . donepezil  10 mg Oral Daily  . enoxaparin (LOVENOX) injection  30 mg Subcutaneous Q12H  . famotidine  10 mg Oral BID  . ferrous sulfate  325 mg Oral TID PC  . finasteride  5 mg Oral QHS  . fluticasone  1 spray Each Nare Daily  . insulin aspart  0-5 Units Subcutaneous QHS  . insulin aspart  0-9 Units Subcutaneous TID WC  . Ipratropium-Albuterol  1 puff Inhalation Q6H  . irbesartan  300 mg Oral Daily  . isosorbide-hydrALAZINE  1 tablet Oral TID  . pantoprazole  80 mg Oral Daily  . sodium chloride flush  10-40 mL Intracatheter Q12H  . traZODone  25 mg Oral QHS   Continuous Infusions: . remdesivir 100 mg in NS 250 mL 100 mg (09/28/19 1100)     LOS: 6 days   Time spent= 35 mins    Kristia Jupiter Arsenio Loader, MD [facility]  If 7PM-7AM, please contact night-coverage   09/29/2019, 10:51 AM

## 2019-09-29 NOTE — Progress Notes (Signed)
Able to contact family this afternoon and update status.  All questions answered and plan of care reviewed.  Plan to attempt to video chat with family this afternoon if able.

## 2019-09-29 NOTE — TOC Progression Note (Signed)
Transition of Care [facility]) - Progression Note    Patient Details  Name: Jason Vasquez MRN: [id number] Date of Birth: 23-Oct-1929  [facility]) CM/SW Contact  Joaquin Courts, RN Phone Number: 09/29/2019, 2:52 PM  Clinical Narrative:    CM spoke with patient's daughters via telephone regarding discharge planning. CM explained process for SNF placement and discussed area SNF facilities that are accepting Covid+ patients, also provided daughters with CMS ratings. After extensive discussion, daughters requested that patient be faxed out to [facility] FL[facility] faxed out, awaiting bed offers.   Of note: CM also spoke with [facility] CS[facility] and provided her with names of facilities accepting covid + patients as well.     Expected Discharge Plan: Skilled Nursing Facility Barriers to Discharge: No Barriers Identified  Expected Discharge Plan and Services Expected Discharge Plan: [place]   Discharge Planning Services: CM Consult                                           Social Determinants of Health (SDOH) Interventions    Readmission Risk Interventions No flowsheet data found.

## 2019-09-29 NOTE — Progress Notes (Signed)
Assisted tele visit to patient with daughters.  Jason Vasquez M Yaeli Hartung, RN   

## 2019-09-30 LAB — GLUCOSE, CAPILLARY
Glucose-Capillary: 115 mg/dL — ABNORMAL HIGH (ref 70–99)
Glucose-Capillary: 133 mg/dL — ABNORMAL HIGH (ref 70–99)
Glucose-Capillary: 207 mg/dL — ABNORMAL HIGH (ref 70–99)
Glucose-Capillary: 148 mg/dL — ABNORMAL HIGH (ref 70–99)

## 2019-09-30 LAB — CBC
HCT: 34.1 % — ABNORMAL LOW (ref 39.0–52.0)
Hemoglobin: 11.9 g/dL — ABNORMAL LOW (ref 13.0–17.0)
MCH: 34 pg (ref 26.0–34.0)
MCHC: 34.9 g/dL (ref 30.0–36.0)
MCV: 97.4 fL (ref 80.0–100.0)
Platelets: 253 10*3/uL (ref 150–400)
RBC: 3.5 MIL/uL — ABNORMAL LOW (ref 4.22–5.81)
RDW: 12.4 % (ref 11.5–15.5)
WBC: 8.8 10*3/uL (ref 4.0–10.5)
nRBC: 0 % (ref 0.0–0.2)

## 2019-09-30 LAB — C-REACTIVE PROTEIN: CRP: 7.8 mg/dL — ABNORMAL HIGH (ref <1.0)

## 2019-09-30 LAB — BASIC METABOLIC PANEL
Anion gap: 13 (ref 5–15)
BUN: 46 mg/dL — ABNORMAL HIGH (ref 8–23)
CO2: 23 mmol/L (ref 22–32)
Calcium: 8.6 mg/dL — ABNORMAL LOW (ref 8.9–10.3)
Chloride: 101 mmol/L (ref 98–111)
Creatinine, Ser: 1.31 mg/dL — ABNORMAL HIGH (ref 0.61–1.24)
GFR calc Af Amer: 56 mL/min — ABNORMAL LOW (ref >60)
GFR calc non Af Amer: 48 mL/min — ABNORMAL LOW (ref >60)
Glucose, Bld: 94 mg/dL (ref 70–99)
Potassium: 4.2 mmol/L (ref 3.5–5.1)
Sodium: 137 mmol/L (ref 135–145)

## 2019-09-30 LAB — FERRITIN: Ferritin: 205 ng/mL (ref 24–336)

## 2019-09-30 LAB — D-DIMER, QUANTITATIVE: D-Dimer, Quant: 0.61 ug/mL-FEU — ABNORMAL HIGH (ref 0.00–0.50)

## 2019-09-30 LAB — LACTATE DEHYDROGENASE: LDH: 321 U/L — ABNORMAL HIGH (ref 98–192)

## 2019-09-30 LAB — MAGNESIUM: Magnesium: 2.1 mg/dL (ref 1.7–2.4)

## 2019-09-30 MED ORDER — LORAZEPAM 2 MG/ML IJ SOLN
1.0000 mg | Freq: Once | INTRAMUSCULAR | Status: AC
  Administered 2019-09-30: 1 mg via INTRAVENOUS
  Filled 2019-09-30: qty 1

## 2019-09-30 MED ORDER — METHYLPREDNISOLONE SODIUM SUCC 40 MG IJ SOLR
40.0000 mg | Freq: Two times a day (BID) | INTRAMUSCULAR | Status: DC
  Administered 2019-09-30 – 2019-10-01 (×3): 40 mg via INTRAVENOUS
  Filled 2019-09-30 (×3): qty 1

## 2019-09-30 NOTE — TOC Progression Note (Signed)
Transition of Care [facility]) - Progression Note    Patient Details  Name: Jason Vasquez MRN: [id number] Date of Birth: 07-19-1929  Transition of Care [facility]) CM/SW Contact  Jason Courts, RN Phone Number: 09/30/2019, 3:24 PM  Clinical Narrative:    CM spoke with patient's daughters and offered choice for SNF. Daughters accept bed offer at [facility] in [place].  CM reached out to [facility] c[facility] Jason Vasquez [phone]) to notify of bed offer.  Cm made aware that Jason Vasquez does not have a contract with [facility], but given the unique situation, Jason Vasquez is prepared to create a one time contract for SNF care.  CM spoke with [facility] rep and confirmed bed acceptance and provided contact name and number for [facility] to begin working on contract.  CM to follow along on progress and update family as information becomes available.   Expected Discharge Plan: Skilled Nursing Facility Barriers to Discharge: No Barriers Identified  Expected Discharge Plan and Services Expected Discharge Plan: [place]   Discharge Planning Services: CM Consult                                           Social Determinants of Health (SDOH) Interventions    Readmission Risk Interventions No flowsheet data found.

## 2019-09-30 NOTE — Progress Notes (Signed)
PROGRESS NOTE    Jason Vasquez  ZOX:[id number] DOB: 1929/07/14 DOA: 09/23/2019 PCP: Miguel AschoffWilliams, Julie Anne, MD   Brief Narrative:  85 year old with history of CKD stage III, HLD, DM2, HTN, GERD, BPH, dementia admitted by orthopedic team following a fall found to have quadricep tendon rupture.  Underwent surgery and hospitalist consulted but due to COVID-19, [facility].  Being treated with remdesivir, steroids.  Haldol as needed for agitation.   Assessment & Plan:   Principal Problem:   COVID-19 virus infection Active Problems:   Quadriceps tendon rupture, right, initial encounter   Quadriceps muscle rupture, right, initial encounter   CKD (chronic kidney disease), stage III   HLD (hyperlipidemia)   DM2 (diabetes mellitus, type 2) (HCC)   BPH (benign prostatic hyperplasia)   Pressure injury of skin   Delirium  COVID-19 infection with hypoxia, initially 2 L nasal cannula -Oxygen levels-remains on room air -Remdesivir day 4 due to infiltrate -Change p.o. steroids to IV Solu-Medrol rising CRP -monitor inflammatory markers -Vitamin C & Zinc. Prone >16hrs/day.  -procalcitonin-0.2 -Chest x-ray-signs of acute bronchitis -Supportive care-antitussive, inhalers, I-S/flutter -CODE STATUS confirmed  Delirium/agitation, improved this morning -Better this morning.  As needed Haldol.  Right complete quadricep tendon rupture -Status post open repair with retinacular repair 11/12.  Follow-up orthopedic, Dr. Devonne DoughtyNoris outpatient in 2 weeks -Long-leg cast placed  History of Alzheimer's dementia, stable -Supportive care.  Continue home meds  Essential hypertension -Cardizem 420 mg daily, irbesartan 300 mg daily, BiDil 3 times daily  Diet controlled DM2 -Monitor blood glucose.  Insulin sliding scale and Accu-Chek.  GERD -Pepcid and Protonix  BPH -On Proscar  DVT prophylaxis: Lovenox Code Status: Full code Family  Communication:  Called Henretta; didn't answer. Left her a voicemail.  Disposition Plan: Cont Hosp stay to complete IV remdesivir, then SNF.   Subjective: Awake, carries on basic conversation.  Denies any complaints this morning.  Asking me when he can go to rehab.  He also wants to get out of bed but I explained to him because of the cast in place he has to have physical therapy.  Review of Systems Otherwise negative except as per HPI, including: General = no fevers, chills, dizziness, malaise, fatigue HEENT/EYES = negative for pain, redness, loss of vision, double vision, blurred vision, loss of hearing, sore throat, hoarseness, dysphagia Cardiovascular= negative for chest pain, palpitation, murmurs, lower extremity swelling Respiratory/lungs= negative for shortness of breath, cough, hemoptysis, wheezing, mucus production Gastrointestinal= negative for nausea, vomiting,, abdominal pain, melena, hematemesis Genitourinary= negative for Dysuria, Hematuria, Change in Urinary Frequency MSK = Negative for arthralgia, myalgias, Back Pain, Joint swelling  Neurology= Negative for headache, seizures, numbness, tingling  Psychiatry= Negative for anxiety, depression, suicidal and homocidal ideation Allergy/Immunology= Medication/Food allergy as listed  Skin= Negative for Rash, lesions, ulcers, itching   Objective: Vitals:   09/29/19 1600 09/29/19 1603 09/29/19 1911 09/30/19 0402  BP:  (!) 141/85 (!) 163/78 (!) 155/74  Pulse:  100 80 76  Resp:   18 20  Temp:  99.3 F (37.4 C) (!) 96.7 F (35.9 C) 98.1 F (36.7 C)  TempSrc:  Axillary Axillary Axillary  SpO2: 98% 98% 99% 98%  Weight:      Height:        Intake/Output Summary (Last 24 hours) at 09/30/2019 0721 Last data filed at 09/30/2019 0400 Gross per 24 hour  Intake 240 ml  Output 550 ml  Net -310 ml   [employer]iled Weights  09/23/19 2115 09/24/19 0607 09/27/19 0500  Weight: 102.1 kg 95.2 kg 98.9 kg    Examination:  Constitutional:  Not in acute distress Respiratory: Clear to auscultation bilaterally Cardiovascular: Normal sinus rhythm, no rubs Abdomen: Nontender nondistended good bowel sounds Musculoskeletal: Right lower extremity cast in place Skin: No rashes seen Neurologic: CN 2-12 grossly intact.  And nonfocal Psychiatric: Alert to name and place.  Confused about time.  Poor judgment and insight    Data Reviewed:   CBC: Recent Labs  Lab 09/23/19 1838 09/25/19 0346 09/26/19 0912 09/27/19 0424 09/28/19 0158 09/29/19 0308  WBC 4.0 3.4* 7.2 8.7 11.0* 10.9*  NEUTROABS 2.1  --  6.4 7.6  --   --   HGB 12.0* 14.9 11.1* 10.5* 10.6* 12.4*  HCT 36.0* 43.6 32.5* 30.8* 30.7* 36.3*  MCV 101.4* 98.2 99.1 99.7 97.5 97.6  PLT 292 204 250 253 250 962   Basic Metabolic Panel: Recent Labs  Lab 09/25/19 0935 09/26/19 0912 09/27/19 0424 09/28/19 0158 09/29/19 0308  NA 135 135 134* 136 139  K 4.1 4.6 3.9 4.5 4.4  CL 98 97* 97* 100 102  CO2 27 29 27 23 24   GLUCOSE 151* 139* 128* 115* 65*  BUN 30* 33* 37* 47* 44*  CREATININE 1.25* 1.43* 1.46* 1.65* 1.39*  CALCIUM 8.6* 8.7* 8.6* 8.6* 8.8*  MG  --   --   --  2.0 2.0  PHOS 3.5  --   --   --   --    GFR: Estimated Creatinine Clearance: 44 mL/min (A) (by C-G formula based on SCr of 1.39 mg/dL (H)). Liver Function Tests: Recent Labs  Lab 09/25/19 0935 09/26/19 0912 09/27/19 0424  AST  --  48* 55*  ALT  --  25 29  ALKPHOS  --  50 49  BILITOT  --  1.1 0.6  PROT  --  6.5 6.1*  ALBUMIN 3.2* 3.1* 2.9*   No results for input(s): LIPASE, AMYLASE in the last 168 hours. No results for input(s): AMMONIA in the last 168 hours. Coagulation Profile: Recent Labs  Lab 09/23/19 1838  INR 1.0   Cardiac Enzymes: No results for input(s): CKTOTAL, CKMB, CKMBINDEX, TROPONINI in the last 168 hours. BNP (last 3 results) No results for input(s): PROBNP in the last 8760 hours. HbA1C: Recent Labs    09/27/19 0800  HGBA1C 6.4*   CBG: Recent Labs  Lab 09/28/19 2116  09/29/19 0721 09/29/19 1116 09/29/19 1602 09/29/19 2019  GLUCAP 88 75 150* 137* 168*   Lipid Profile: No results for input(s): CHOL, HDL, LDLCALC, TRIG, CHOLHDL, LDLDIRECT in the last 72 hours. Thyroid Function Tests: No results for input(s): TSH, T4TOTAL, FREET4, T3FREE, THYROIDAB in the last 72 hours. Anemia Panel: Recent Labs    09/28/19 0158 09/29/19 0308  FERRITIN 160 234   Sepsis Labs: Recent Labs  Lab 09/25/19 1730 09/26/19 0912 09/27/19 0424  PROCALCITON 0.34 0.32 0.25    Recent Results (from the past 240 hour(s))  SARS CORONAVIRUS 2 (TAT 6-24 HRS) Nasopharyngeal Nasopharyngeal Swab     Status: Abnormal   Collection Time: 09/23/19  9:46 PM   Specimen: Nasopharyngeal Swab  Result Value Ref Range Status   SARS Coronavirus 2 POSITIVE (A) NEGATIVE Final    Comment: RESULT CALLED TO, READ BACK BY AND VERIFIED WITH: BDonnamarie Poag  9528 09/24/2019 T. TYSOR (NOTE) SARS-CoV-2 target nucleic acids are DETECTED. The SARS-CoV-2 RNA is generally detectable in upper and lower respiratory specimens during the acute phase of infection. Positive  results are indicative of active infection with SARS-CoV-2. Clinical  correlation with patient history and other diagnostic information is necessary to determine patient infection status. Positive results do  not rule out bacterial infection or co-infection with other viruses. The expected result is Negative. Fact Sheet for Patients: HairSlick.no Fact Sheet for Healthcare Providers: quierodirigir.com This test is not yet approved or cleared by the [place] FDA and  has been authorized for detection and/or diagnosis of SARS-CoV-2 by FDA under an Emergency Use Authorization (EUA). This EUA will remain  in effect (meaning this test can be used ) for the duration of the COVID-19 declaration under Section 564(b)(1) of the Act, 21 U.S.C. section 360bbb-3(b)(1), unless the  authorization is terminated or revoked sooner. Performed at [facility], [address] [place], [place] [address]   Surgical pcr screen     Status: None   Collection Time: 09/23/19  9:46 PM   Specimen: Nasal Mucosa; Nasal Swab  Result Value Ref Range Status   MRSA, PCR NEGATIVE NEGATIVE Final   Staphylococcus aureus NEGATIVE NEGATIVE Final    Comment: (NOTE) The Xpert SA Assay (FDA approved for NASAL specimens in patients 63 years of age and older), is one component of a comprehensive surveillance program. It is not intended to diagnose infection nor to guide or monitor treatment. Performed at [facility], [address] [place], [place] [address]       Radiology Studies: No results found.   Scheduled Meds: . atorvastatin  10 mg Oral q1800  . citalopram  10 mg Oral Daily  . dexamethasone  6 mg Oral Daily  . diltiazem  420 mg Oral Daily  . docusate sodium  100 mg Oral BID  . donepezil  10 mg Oral Daily  . enoxaparin (LOVENOX) injection  30 mg Subcutaneous Q12H  . famotidine  10 mg Oral BID  . ferrous sulfate  325 mg Oral TID PC  . finasteride  5 mg Oral QHS  . fluticasone  1 spray Each Nare Daily  . insulin aspart  0-5 Units Subcutaneous QHS  . insulin aspart  0-9 Units Subcutaneous TID WC  . Ipratropium-Albuterol  1 puff Inhalation Q6H  . irbesartan  300 mg Oral Daily  . isosorbide-hydrALAZINE  1 tablet Oral TID  . pantoprazole  80 mg Oral Daily  . sodium chloride flush  10-40 mL Intracatheter Q12H  . traZODone  25 mg Oral QHS  . vitamin C  500 mg Oral Daily  . zinc sulfate  220 mg Oral Daily   Continuous Infusions: . remdesivir 100 mg in NS 250 mL 100 mg (09/29/19 1117)     LOS: 7 days   Time spent= 35 mins    Ankit Joline Maxcy, MD [facility]  If 7PM-7AM, please contact night-coverage  09/30/2019, 7:21 AM

## 2019-09-30 NOTE — Progress Notes (Addendum)
Physical Therapy Treatment Patient Details Name: Jason Vasquez MRN: [id number] DOB: 10-19-1929 Today's Date: 09/30/2019    History of Present Illness Pt is 85 yo male s/p fall with R knee pain and quad tendon rupture.  He has history of CKD, COPD, dementia, and HTN.  Upon admission, pt found to be COVID 19 +.  Pt s/p open repair R quadriceps tendon rupture with retinacular repair on 09/24/19. Pt now with long leg cast and NWB status. Pt transferred to [facility].    PT Comments    Pt pleasantly confused and willing to mobilize as able. Continues to progress slowly as expected due to long leg cast and NWB status. Will continue to work on bed mobility and scooting. Continue to recommend SNF.    Follow Up Recommendations  SNF     Equipment Recommendations  None recommended by PT    Recommendations for Other Services       Precautions / Restrictions Precautions Precautions: Fall Required Braces or Orthoses: Splint/Cast Splint/Cast: Long leg splint R LE Restrictions Weight Bearing Restrictions: Yes RLE Weight Bearing: Non weight bearing    Mobility  Bed Mobility Overal bed mobility: Needs Assistance Bed Mobility: Supine to Sit;Sit to Supine Rolling: Min assist   Supine to sit: Mod assist Sit to supine: Min assist   General bed mobility comments: Assist to move RLE off of bed and elevate trunk into sitting. Assist to bring RLE back up into bed. Min assist to roll to right.   Transfers     Transfers: Lateral/Scoot Transfers           General transfer comment: Per lateral scooting on edge of bed with min assist. Unable to attempt scoot to chair due to no drop arm recliner present. Did not attempt stand due to pt NWB on RLE.   Ambulation/Gait                 Stairs             Wheelchair Mobility    Modified Rankin (Stroke Patients Only)       Balance Overall balance assessment: Needs assistance Sitting-balance support: Feet unsupported;No upper extremity  supported Sitting balance-Leahy Scale: Fair Sitting balance - Comments: Props on elbows to rest at times                                    Cognition Arousal/Alertness: Awake/alert Behavior During Therapy: WFL for tasks assessed/performed Overall Cognitive Status: No family/caregiver present to determine baseline cognitive functioning                                 General Comments: Pt with history of dementia. Follows 1 step commands with cues. Oriented to self only and poor short term memory.      Exercises General Exercises - Upper Extremity Shoulder Flexion: AAROM;Both;5 reps;Seated General Exercises - Lower Extremity Long Arc Quad: AROM;Left;Seated;10 reps    General Comments        Pertinent Vitals/Pain Pain Assessment: Faces Faces Pain Scale: Hurts a little bit Pain Location: Rt knee Pain Descriptors / Indicators: Aching Pain Intervention(s): Limited activity within patient's tolerance;Monitored during session;Repositioned    Home Living                      Prior Function  PT Goals (current goals can now be found in the care plan section) Acute Rehab PT Goals Patient Stated Goal: Pt unable to state Progress towards PT goals: Progressing toward goals    Frequency    Min 2X/week      PT Plan Current plan remains appropriate;Frequency needs to be updated    Co-evaluation              AM-PAC PT "6 Clicks" Mobility   Outcome Measure  Help needed turning from your back to your side while in a flat bed without using bedrails?: A Little Help needed moving from lying on your back to sitting on the side of a flat bed without using bedrails?: A Lot Help needed moving to and from a bed to a chair (including a wheelchair)?: Total Help needed standing up from a chair using your arms (e.g., wheelchair or bedside chair)?: Total Help needed to walk in hospital room?: Total Help needed climbing 3-5 steps with a  railing? : Total 6 Click Score: 9    End of Session   Activity Tolerance: Patient tolerated treatment well Patient left: in bed;with bed alarm set;with call bell/phone within reach Nurse Communication: Mobility status PT Visit Diagnosis: Unsteadiness on feet (R26.81);History of falling (Z91.81);Muscle weakness (generalized) (M62.81)     Time: 2130-8657 PT Time Calculation (min) (ACUTE ONLY): 37 min  Charges:                        [facility] PT Acute Rehabilitation Services Pager [phone] Office [phone]    Angelina Ok [facility] 09/30/2019, 2:03 PM

## 2019-10-01 DIAGNOSIS — Z419 Encounter for procedure for purposes other than remedying health state, unspecified: Secondary | ICD-10-CM

## 2019-10-01 LAB — C-REACTIVE PROTEIN: CRP: 5 mg/dL — ABNORMAL HIGH (ref <1.0)

## 2019-10-01 LAB — BASIC METABOLIC PANEL
Anion gap: 11 (ref 5–15)
BUN: 51 mg/dL — ABNORMAL HIGH (ref 8–23)
CO2: 24 mmol/L (ref 22–32)
Calcium: 8.6 mg/dL — ABNORMAL LOW (ref 8.9–10.3)
Chloride: 100 mmol/L (ref 98–111)
Creatinine, Ser: 1.34 mg/dL — ABNORMAL HIGH (ref 0.61–1.24)
GFR calc Af Amer: 54 mL/min — ABNORMAL LOW (ref >60)
GFR calc non Af Amer: 47 mL/min — ABNORMAL LOW (ref >60)
Glucose, Bld: 148 mg/dL — ABNORMAL HIGH (ref 70–99)
Potassium: 4.2 mmol/L (ref 3.5–5.1)
Sodium: 135 mmol/L (ref 135–145)

## 2019-10-01 LAB — GLUCOSE, CAPILLARY
Glucose-Capillary: 184 mg/dL — ABNORMAL HIGH (ref 70–99)
Glucose-Capillary: 119 mg/dL — ABNORMAL HIGH (ref 70–99)
Glucose-Capillary: 163 mg/dL — ABNORMAL HIGH (ref 70–99)
Glucose-Capillary: 196 mg/dL — ABNORMAL HIGH (ref 70–99)

## 2019-10-01 LAB — CBC
HCT: 33.5 % — ABNORMAL LOW (ref 39.0–52.0)
Hemoglobin: 11.8 g/dL — ABNORMAL LOW (ref 13.0–17.0)
MCH: 33.9 pg (ref 26.0–34.0)
MCHC: 35.2 g/dL (ref 30.0–36.0)
MCV: 96.3 fL (ref 80.0–100.0)
Platelets: 251 10*3/uL (ref 150–400)
RBC: 3.48 MIL/uL — ABNORMAL LOW (ref 4.22–5.81)
RDW: 12.2 % (ref 11.5–15.5)
WBC: 6.7 10*3/uL (ref 4.0–10.5)
nRBC: 0 % (ref 0.0–0.2)

## 2019-10-01 LAB — MAGNESIUM: Magnesium: 2.1 mg/dL (ref 1.7–2.4)

## 2019-10-01 LAB — FERRITIN: Ferritin: 209 ng/mL (ref 24–336)

## 2019-10-01 LAB — D-DIMER, QUANTITATIVE: D-Dimer, Quant: 0.87 ug/mL-FEU — ABNORMAL HIGH (ref 0.00–0.50)

## 2019-10-01 LAB — LACTATE DEHYDROGENASE: LDH: 292 U/L — ABNORMAL HIGH (ref 98–192)

## 2019-10-01 MED ORDER — PANTOPRAZOLE SODIUM 40 MG PO TBEC
40.0000 mg | DELAYED_RELEASE_TABLET | Freq: Every day | ORAL | Status: DC
  Administered 2019-10-02 – 2019-10-03 (×2): 40 mg via ORAL
  Filled 2019-10-01 (×2): qty 1

## 2019-10-01 NOTE — Progress Notes (Signed)
PROGRESS NOTE    Jason Vasquez  IRJ:[id number] DOB: [birth year] DOA: 09/23/2019 PCP: Miguel Aschoff, MD    Brief Narrative:  85 year old male who presented with with quadriceps tendon rupture on his right lower extremity, he was admitted for surgical intervention.  He has past medical history of chronic kidney disease stage III, dyslipidemia, type II that is mellitus, hypertension, GERD, BPH and dementia.  On his screening admission test he was positive for COVID-19.  On his initial physical examination his blood pressure was 153/68, pulse rate 61 respiratory rate 19, temperature 99.5, oxygen saturation 96%.  Lungs are clear to auscultation bilaterally, heart is also present rhythmic, his abdomen was soft, no extremity edema. He underwent surgical intervention and then transferred to [facility] to care for his COVID-19. Patient developed hypoxemia and his chest x-ray had left lingular interstitial infiltrate.  Patient has been treated with remdesivir and systemic corticosteroids.  Patient has been complicated by metabolic encephalopathy with delirium.   Assessment & Plan:   Principal Problem:   COVID-19 virus infection Active Problems:   Quadriceps tendon rupture, right, initial encounter   Quadriceps muscle rupture, right, initial encounter   CKD (chronic kidney disease), stage III   HLD (hyperlipidemia)   DM2 (diabetes mellitus, type 2) (HCC)   BPH (benign prostatic hyperplasia)   Pressure injury of skin   Delirium  1.  Acute hypoxic respiratory failure due to SARS COVID-19 viral pneumonia.  RR 18  Pulse oxymetry: 95 to 97%  Fi02: 21% room air.  COVID-19 Labs  Recent Labs    09/29/19 0308 09/30/19 0705 10/01/19 0005  DDIMER 1.13* 0.61* 0.87*  FERRITIN 234 205 209  LDH 371* 321* 292*  CRP 4.3* 7.8* 5.0*    Lab Results  Component Value Date   SARSCOV2NAA POSITIVE (A) 09/23/2019   Inflammatory markers are trending down.   Patient has completed  Remdesivir therapy with good toleration, now on room air. Will discontinue systemic steroids and will continue to monitori oxymetry.  2. Metabolic encephalopathy with delirium in the setting of Alzheimer's dementia. Patient today is calm and cooperative, no agiation. Will continue as needed haldol, (yesterday had 3 mg IV). Continue with citalopram, trazodone and donepezil.   3. Right complete quadriceps rupture. Continue pain control and physical therapy, currently pending placement SNF.   4. HTN. Continue blood pressure control with bidil, irbesartan and diltiazem.   5. GERD. Continue pantoprazole.  6. BPH. On proscar, no signs of urinary retention.   7. T2DM with dyslipidemia. Continue glucose cover and monitoring with insulin sliding scale, will hold on systemic steroids for now. Continue with atorvastatin.   8. Obesity. BMI is 30,4  9. AKI on CKD stage 3a. Renal function with improved serum cr down rto 1,34 with K at 4,2 and serum bicarbonate at 24.   DVT prophylaxis: enoxaparin   Code Status:  full Family Communication: no family at the bedside  Disposition Plan/ discharge barriers: pending placement.   Body mass index is 30.41 kg/m. Malnutrition Type:      Malnutrition Characteristics:      Nutrition Interventions:     RN Pressure Injury Documentation: Pressure Injury 09/27/19 Buttocks Left;Medial Stage II -  Partial thickness loss of dermis presenting as a shallow open ulcer with a red, pink wound bed without slough. Oval on left buttock. (Active)  09/27/19 0500  Location: Buttocks  Location Orientation: Left;Medial  Staging: Stage II -  Partial thickness loss of dermis presenting as a shallow open ulcer with  a red, pink wound bed without slough.  Wound Description (Comments): Oval on left buttock.  Present on Admission: No     Consultants:     Procedures:     Antimicrobials:       Subjective: Patient is feeling well this am, no chest pain, no  nausea or vomiting, continue to be very weak and deconditioned.   Objective: Vitals:   09/30/19 1913 10/01/19 0000 10/01/19 0400 10/01/19 0732  BP: (!) 149/68 126/64 (!) 168/60 (!) 163/71  Pulse: 78 70 82 77  Resp: 18 16 18 18   Temp: 97.8 F (36.6 C) 98 F (36.7 C) 98.2 F (36.8 C) 97.8 F (36.6 C)  TempSrc: Axillary Axillary Axillary Oral  SpO2: 100% 90% 97% 95%  Weight:      Height:        Intake/Output Summary (Last 24 hours) at 10/01/2019 0834 Last data filed at 10/01/2019 0733 Gross per 24 hour  Intake 910 ml  Output 700 ml  Net 210 ml   Filed Weights   09/23/19 2115 09/24/19 0607 09/27/19 0500  Weight: 102.1 kg 95.2 kg 98.9 kg    Examination:   General: Not in pain or dyspnea. Deconditioned  Neurology: Awake and alert, non focal  E ENT: mild pallor, no icterus, oral mucosa moist Cardiovascular: No JVD. S1-S2 present, rhythmic, no gallops, rubs, or murmurs. No lower extremity edema. Pulmonary: vesicular breath sounds bilaterally. Gastrointestinal. Abdomen with  no organomegaly, non tender, no rebound or guarding Skin. No rashes Musculoskeletal: no joint deformities     Data Reviewed: I have personally reviewed following labs and imaging studies  CBC: Recent Labs  Lab 09/26/19 0912 09/27/19 0424 09/28/19 0158 09/29/19 0308 09/30/19 0705 10/01/19 0005  WBC 7.2 8.7 11.0* 10.9* 8.8 6.7  NEUTROABS 6.4 7.6  --   --   --   --   HGB 11.1* 10.5* 10.6* 12.4* 11.9* 11.8*  HCT 32.5* 30.8* 30.7* 36.3* 34.1* 33.5*  MCV 99.1 99.7 97.5 97.6 97.4 96.3  PLT 250 253 250 249 253 251   Basic Metabolic Panel: Recent Labs  Lab 09/25/19 0935  09/27/19 0424 09/28/19 0158 09/29/19 0308 09/30/19 0705 10/01/19 0005  NA 135   < > 134* 136 139 137 135  K 4.1   < > 3.9 4.5 4.4 4.2 4.2  CL 98   < > 97* 100 102 101 100  CO2 27   < > 27 23 24 23 24   GLUCOSE 151*   < > 128* 115* 65* 94 148*  BUN 30*   < > 37* 47* 44* 46* 51*  CREATININE 1.25*   < > 1.46* 1.65* 1.39*  1.31* 1.34*  CALCIUM 8.6*   < > 8.6* 8.6* 8.8* 8.6* 8.6*  MG  --   --   --  2.0 2.0 2.1 2.1  PHOS 3.5  --   --   --   --   --   --    < > = values in this interval not displayed.   GFR: Estimated Creatinine Clearance: 45.7 mL/min (A) (by C-G formula based on SCr of 1.34 mg/dL (H)). Liver Function Tests: Recent Labs  Lab 09/25/19 0935 09/26/19 0912 09/27/19 0424  AST  --  48* 55*  ALT  --  25 29  ALKPHOS  --  50 49  BILITOT  --  1.1 0.6  PROT  --  6.5 6.1*  ALBUMIN 3.2* 3.1* 2.9*   No results for input(s): LIPASE, AMYLASE in the last  168 hours. No results for input(s): AMMONIA in the last 168 hours. Coagulation Profile: No results for input(s): INR, PROTIME in the last 168 hours. Cardiac Enzymes: No results for input(s): CKTOTAL, CKMB, CKMBINDEX, TROPONINI in the last 168 hours. BNP (last 3 results) No results for input(s): PROBNP in the last 8760 hours. HbA1C: No results for input(s): HGBA1C in the last 72 hours. CBG: Recent Labs  Lab 09/30/19 0751 09/30/19 1125 09/30/19 1559 09/30/19 2113 10/01/19 0731  GLUCAP 115* 133* 207* 148* 184*   Lipid Profile: No results for input(s): CHOL, HDL, LDLCALC, TRIG, CHOLHDL, LDLDIRECT in the last 72 hours. Thyroid Function Tests: No results for input(s): TSH, T4TOTAL, FREET4, T3FREE, THYROIDAB in the last 72 hours. Anemia Panel: Recent Labs    09/30/19 0705 10/01/19 0005  FERRITIN 205 33      Radiology Studies: I have reviewed all of the imaging during this hospital visit personally     Scheduled Meds: . atorvastatin  10 mg Oral q1800  . citalopram  10 mg Oral Daily  . diltiazem  420 mg Oral Daily  . docusate sodium  100 mg Oral BID  . donepezil  10 mg Oral Daily  . enoxaparin (LOVENOX) injection  30 mg Subcutaneous Q12H  . famotidine  10 mg Oral BID  . ferrous sulfate  325 mg Oral TID PC  . finasteride  5 mg Oral QHS  . fluticasone  1 spray Each Nare Daily  . insulin aspart  0-5 Units Subcutaneous QHS  .  insulin aspart  0-9 Units Subcutaneous TID WC  . Ipratropium-Albuterol  1 puff Inhalation Q6H  . irbesartan  300 mg Oral Daily  . isosorbide-hydrALAZINE  1 tablet Oral TID  . methylPREDNISolone (SOLU-MEDROL) injection  40 mg Intravenous Q12H  . pantoprazole  80 mg Oral Daily  . sodium chloride flush  10-40 mL Intracatheter Q12H  . traZODone  25 mg Oral QHS  . vitamin C  500 mg Oral Daily  . zinc sulfate  220 mg Oral Daily   Continuous Infusions: . remdesivir 100 mg in NS 250 mL 100 mg (09/30/19 1047)     LOS: 8 days         Gerome Apley, MD

## 2019-10-01 NOTE — Care Management Important Message (Signed)
Important Message  Patient Details  Name: Jason Vasquez MRN: [id number] Date of Birth: 1929/10/17   Medicare Important Message Given:  Yes - Important Message mailed due to current National Emergency  Verbal consent obtained due to current National Emergency  Relationship to patient: Child Contact Name: Blain Pais and Rolanda Lundborg (daughters both on emergency contact) Call Date: 10/01/19  Time: 1432 Phone: [phone] Outcome: Spoke with contact Important Message mailed to: Other (must enter comment)(emailed to [facility] [email or web address])    Swetha Rayle P Alissa Pharr 10/01/2019, 2:33 PM

## 2019-10-02 DIAGNOSIS — N1831 Chronic kidney disease, stage 3a: Secondary | ICD-10-CM

## 2019-10-02 LAB — GLUCOSE, CAPILLARY
Glucose-Capillary: 116 mg/dL — ABNORMAL HIGH (ref 70–99)
Glucose-Capillary: 116 mg/dL — ABNORMAL HIGH (ref 70–99)
Glucose-Capillary: 128 mg/dL — ABNORMAL HIGH (ref 70–99)
Glucose-Capillary: 168 mg/dL — ABNORMAL HIGH (ref 70–99)

## 2019-10-02 MED ORDER — LORATADINE 10 MG PO TABS
10.0000 mg | ORAL_TABLET | Freq: Every day | ORAL | Status: DC | PRN
  Administered 2019-10-02: 22:00:00 10 mg via ORAL
  Filled 2019-10-02: qty 1

## 2019-10-02 MED ORDER — SALINE SPRAY 0.65 % NA SOLN
1.0000 | NASAL | Status: DC | PRN
  Administered 2019-10-02: 1 via NASAL
  Filled 2019-10-02: qty 44

## 2019-10-02 NOTE — Progress Notes (Signed)
RN was informed per case manager, that we are still waiting on a bed for the patient to be dc

## 2019-10-02 NOTE — Discharge Summary (Addendum)
Physician Discharge Summary  Jason Vasquez XLK:[id number] DOB: May 25, 1929 DOA: 09/23/2019  PCP: Miguel Aschoff, MD  Admit date: 09/23/2019 Discharge date: 10/03/2019  Admitted From: Home  Disposition:  SNF   Recommendations for Outpatient Follow-up and new medication changes:  1. Follow up with Dr. Mayford Knife in 2 weeks.  2. Continue enoxaparin for dvt prophylaxis as instructed.  3. Continue quarantine for 2 weeks, use a mask in public and maintain physical distancing.   Home Health: na  Equipment/Devices:    Discharge Condition: stable  CODE STATUS: full  Diet recommendation: heart healthy   Brief/Interim Summary: 85 year old male who presented with quadriceps tendon rupture on his right lower extremity, he was admitted for surgical intervention.  He has a past medical history of chronic kidney disease stage III, dyslipidemia, type II diabetes mellitus, hypertension, GERD, BPH and dementia.  On his screening admission testing he was positive for COVID-19.  On his initial physical examination his blood pressure was 153/68, pulse rate 61 respiratory rate 19, temperature 99.5, oxygen saturation 96%.  Lungs were clear to auscultation bilaterally, heart S1 -S2 present and rhythmic, his abdomen was soft, no lower extremity edema. Sodium 136, potassium 3.2, chloride 98, bicarb 29, glucose 130, BUN 34, creatinine 1.8, white count 4.0, hemoglobin 12.0, hematocrit 36.0, platelets 292.  SARS COVID-19 positive.  He underwent surgical intervention and then transferred to [facility] to care for his COVID-19.  Patient developed hypoxemia and his chest x-ray had left lingular interstitial infiltrate.  Patient has been treated with remdesivir and systemic corticosteroids. Hospitalization was complicated by metabolic encephalopathy with delirium.  1.  Acute hypoxic respiratory failure due to SARS COVID-19 viral pneumonia.  Patient was admitted to the medical ward, he received supplemental  oxygen per nasal cannula, remdesivir and intravenous corticosteroids.  He responded well to medical therapy, with improvement of his symptoms and inflammatory markers.  His discharge oximetry is 95% on room air.  2.  Metabolic encephalopathy with delirium in the setting of Alzheimer's dementia.  Patient's hospitalization was complicated by delirium, likely multifactorial, he required haloperidol for agitation control. At discharge patient will continue citalopram, trazodone and donepezil.  He has been calm and cooperative.  No further agitation.  3.  Right complete quadriceps rupture.  Patient underwent surgical intervention on November 11, repair right quadriceps tendon with retinacular repair.  Patient was seen by physical therapy, recommendations to continue care at the skilled nursing facility.  Continue DVT prophylaxis with enoxaparin.  4.  Hypertension.  Continue blood pressure control  5.  GERD.  Continue pantoprazole.  6.  BPH.  Continue Proscar.  7.  Type 2 diabetes mellitus with dyslipidemia.  Patient was placed on insulin sliding scale for glucose coverage and monitoring, his glucose remained stable.  Continue atorvastatin.  8.  Obesity.  Calculated BMI 30.4.  9.  Acute kidney injury on chronic kidney disease stage IIIa.  Patient received supportive medical care, his kidney function improved, with his creatinine returned to baseline 1.34.  10. Stage 2 pressure ulcer left buttock, not present on admission, continue with local wound care.   Pressure Injury 09/27/19 Buttocks Left;Medial Stage II -  Partial thickness loss of dermis presenting as a shallow open ulcer with a red, pink wound bed without slough. Oval on left buttock. (Active)  09/27/19 0500  Location: Buttocks  Location Orientation: Left;Medial  Staging: Stage II -  Partial thickness loss of dermis presenting as a shallow open ulcer with a red, pink wound bed without slough.  Wound Description (Comments): Oval on left  buttock.  Present on Admission: No     Discharge Diagnoses:  Principal Problem:   COVID-19 virus infection Active Problems:   Quadriceps tendon rupture, right, initial encounter   Quadriceps muscle rupture, right, initial encounter   CKD (chronic kidney disease), stage III   HLD (hyperlipidemia)   DM2 (diabetes mellitus, type 2) (HCC)   BPH (benign prostatic hyperplasia)   Pressure injury of skin   Delirium    Discharge Instructions  Discharge Instructions    Non weight bearing   Complete by: As directed    Laterality: right   Extremity: Lower     Allergies as of 10/02/2019      Reactions   Ace Inhibitors Cough   Ciprofloxacin Rash   Other reaction(s): RASH   Levofloxacin Rash   Other reaction(s): RASH   Metformin Diarrhea   Other reaction(s): DIARRHEA   Neomy-bacit-polymyx-pramoxine Rash   Patient is allergic to all antibiotics except Doxycycline Patient is allergic to all antibiotics except Doxycycline   Tramadol Nausea Only   Other reaction(s): NAUSEA      Medication List    TAKE these medications   acetaminophen 650 MG CR tablet Commonly known as: TYLENOL Take 650 mg by mouth every 8 (eight) hours as needed for pain. What changed: Another medication with the same name was removed. Continue taking this medication, and follow the directions you see here.   BIOFREEZE EX Apply 1 application topically 4 (four) times daily. Each knee   chlorthalidone 50 MG tablet Commonly known as: HYGROTON Take 50 mg by mouth daily.   citalopram 10 MG tablet Commonly known as: CELEXA Take 10 mg by mouth daily.   diltiazem 420 MG 24 hr tablet Commonly known as: CARDIZEM LA Take 420 mg by mouth daily.   enoxaparin 30 MG/0.3ML injection Commonly known as: Lovenox Inject 0.3 mLs (30 mg total) into the skin every 12 (twelve) hours.   famotidine 10 MG tablet Commonly known as: PEPCID Take 10 mg by mouth 2 (two) times daily.   finasteride 5 MG tablet Commonly known  as: PROSCAR Take 5 mg by mouth at bedtime.   fluticasone 50 MCG/ACT nasal spray Commonly known as: FLONASE Place 1 spray into both nostrils daily.   methocarbamol 500 MG tablet Commonly known as: ROBAXIN Take 1 tablet (500 mg total) by mouth every 6 (six) hours as needed for muscle spasms.   telmisartan 80 MG tablet Commonly known as: MICARDIS Take 80 mg by mouth daily.   tolnaftate 1 % powder Commonly known as: TINACTIN Apply 1 application topically 2 (two) times daily. feet   traZODone 50 MG tablet Commonly known as: DESYREL Take 25 mg by mouth at bedtime.   Vitamin D3 1.25 MG (50000 UT) Caps Take 1.25 mg by mouth every 30 (thirty) days.            Discharge Care Instructions  (From admission, onward)         Start     Ordered   09/24/19 0000  Non weight bearing    Question Answer Comment  Laterality right   Extremity Lower      09/24/19 1807          Contact information for follow-up providers    Beverely LowNorris, Steve, MD. Call in 2 weeks.   Specialty: Orthopedic Surgery Why: call [phone] for appt Contact information: [address] [place] [id number] [phone]  Contact information for after-discharge care    Destination    [facility] .   Service: Skilled Nursing Contact information: [address] [place] [address] [phone]                 Allergies  Allergen Reactions  . Ace Inhibitors Cough  . Ciprofloxacin Rash    Other reaction(s): RASH   . Levofloxacin Rash    Other reaction(s): RASH   . Metformin Diarrhea    Other reaction(s): DIARRHEA   . Neomy-Bacit-Polymyx-Pramoxine Rash    Patient is allergic to all antibiotics except Doxycycline Patient is allergic to all antibiotics except Doxycycline   . Tramadol Nausea Only    Other reaction(s): NAUSEA     Consultations:  Orthopedics    Procedures/Studies: Dg Chest 2 View  Result Date:  09/23/2019 CLINICAL DATA:  85 year old male under preoperative evaluation. EXAM: CHEST - 2 VIEW COMPARISON:  Chest x-ray 11/30/2017. FINDINGS: Lung volumes are low. No acute consolidative airspace disease. No pleural effusions. Mild diffuse interstitial prominence and peribronchial cuffing. No pneumothorax. No evidence of pulmonary edema. Heart size is normal. The patient is rotated to the right on today's exam, resulting in distortion of the mediastinal contours and reduced diagnostic sensitivity and specificity for mediastinal pathology. IMPRESSION: 1. Mild diffuse interstitial prominence and peribronchial cuffing, concerning for an acute bronchitis. Electronically Signed   By: Trudie Reed M.D.   On: 09/23/2019 20:13   Dg Ankle Complete Right  Result Date: 09/17/2019 CLINICAL DATA:  Persistent pain EXAM: RIGHT ANKLE - COMPLETE 3+ VIEW COMPARISON:  None. FINDINGS: Diffuse soft tissue swelling. No fracture or malalignment. Ankle mortise is symmetric. IMPRESSION: Soft tissue swelling.  No acute osseous abnormality. Electronically Signed   By: Jasmine Pang M.D.   On: 09/17/2019 15:25   Dg Knee 4 Views W/patella Right  Result Date: 09/18/2019 CLINICAL DATA:  Acute RIGHT knee pain following fall today. Initial encounter. EXAM: RIGHT KNEE - COMPLETE 4+ VIEW COMPARISON:  09/15/2019 FINDINGS: No acute fracture, subluxation or dislocation. No definite knee effusion is noted. Probable soft tissue swelling noted. Mild tricompartmental joint space narrowing noted. IMPRESSION: Probable soft tissue swelling without acute bony abnormality. Electronically Signed   By: Harmon Pier M.D.   On: 09/18/2019 16:50   Dg Foot Complete Right  Result Date: 09/17/2019 CLINICAL DATA:  Persistent pain, swelling across the metatarsals EXAM: RIGHT FOOT COMPLETE - 3+ VIEW COMPARISON:  None. FINDINGS: No fracture or malalignment. Mild degenerative change at the first MTP joint. Dorsal soft tissue swelling IMPRESSION: No acute  osseous abnormality Electronically Signed   By: Jasmine Pang M.D.   On: 09/17/2019 15:25   Vas [place] Lower Extremity Venous (dvt)  Result Date: 09/25/2019  Lower Venous Study Indications: Swelling, and Tendon tear.  Risk Factors: Trauma. Limitations: Poor ultrasound/tissue interface and patient positioning. Comparison Study: No prior studies. Performing Technologist: Chanda Busing RVT  Examination Guidelines: A complete evaluation includes B-mode imaging, spectral Doppler, color Doppler, and power Doppler as needed of all accessible portions of each vessel. Bilateral testing is considered an integral part of a complete examination. Limited examinations for reoccurring indications may be performed as noted.  +---------+---------------+---------+-----------+----------+--------------+ RIGHT    CompressibilityPhasicitySpontaneityPropertiesThrombus Aging +---------+---------------+---------+-----------+----------+--------------+ CFV      Full           Yes      Yes                                 +---------+---------------+---------+-----------+----------+--------------+  SFJ      Full                                                        +---------+---------------+---------+-----------+----------+--------------+ FV Prox  Full                                                        +---------+---------------+---------+-----------+----------+--------------+ FV Mid   Full                                                        +---------+---------------+---------+-----------+----------+--------------+ FV DistalFull                                                        +---------+---------------+---------+-----------+----------+--------------+ PFV      Full                                                        +---------+---------------+---------+-----------+----------+--------------+ POP      Full           Yes      Yes                                  +---------+---------------+---------+-----------+----------+--------------+ PTV      Full                                                        +---------+---------------+---------+-----------+----------+--------------+ PERO     Full                                                        +---------+---------------+---------+-----------+----------+--------------+   +---------+---------------+---------+-----------+----------+--------------+ LEFT     CompressibilityPhasicitySpontaneityPropertiesThrombus Aging +---------+---------------+---------+-----------+----------+--------------+ CFV      Full           Yes      Yes                                 +---------+---------------+---------+-----------+----------+--------------+ SFJ      Full                                                        +---------+---------------+---------+-----------+----------+--------------+  FV Prox  Full                                                        +---------+---------------+---------+-----------+----------+--------------+ FV Mid   Full                                                        +---------+---------------+---------+-----------+----------+--------------+ FV DistalFull                                                        +---------+---------------+---------+-----------+----------+--------------+ PFV      Full                                                        +---------+---------------+---------+-----------+----------+--------------+ POP      Full           Yes      Yes                                 +---------+---------------+---------+-----------+----------+--------------+ PTV      Full                                                        +---------+---------------+---------+-----------+----------+--------------+ PERO     Full                                                         +---------+---------------+---------+-----------+----------+--------------+     Summary: Right: There is no evidence of deep vein thrombosis in the lower extremity. However, portions of this examination were limited- see technologist comments above. No cystic structure found in the popliteal fossa. Left: There is no evidence of deep vein thrombosis in the lower extremity. However, portions of this examination were limited- see technologist comments above. No cystic structure found in the popliteal fossa.  *See table(s) above for measurements and observations. Electronically signed by Gretta Began MD on 09/25/2019 at 6:52:59 AM.    Final       Procedures: quadriceps repair/ right lower extremity.   Subjective: Patient is feeling well, no further agitation. Tolerating po well, no chest pain or dyspnea.   Discharge Exam: Vitals:   10/01/19 1925 10/02/19 0434  BP: (!) 143/49 130/67  Pulse: 76 80  Resp: 16 18  Temp: 97.6 F (36.4 C) 97.8 F (36.6 C)  SpO2: 97% 95%   Vitals:   10/01/19 1548 10/01/19 1600 10/01/19 1925 10/02/19 0434  BP: Marland Kitchen)  158/81 (!) 154/64 (!) 143/49 130/67  Pulse: 78 (!) 58 76 80  Resp: 15  16 18   Temp: 97.9 F (36.6 C)  97.6 F (36.4 C) 97.8 F (36.6 C)  TempSrc: Axillary  Oral Axillary  SpO2: 95% 91% 97% 95%  Weight:      Height:        General: Not in pain or dyspnea.  Neurology: Awake and alert, non focal  E ENT: no pallor, no icterus, oral mucosa moist Cardiovascular: No JVD. S1-S2 present, rhythmic, no gallops, rubs, or murmurs. No lower extremity edema. Pulmonary: positive breath sounds bilaterally. Gastrointestinal. Abdomen fwith no organomegaly, non tender, no rebound or guarding Skin. No rashes Musculoskeletal: no joint deformities   The results of significant diagnostics from this hospitalization (including imaging, microbiology, ancillary and laboratory) are listed below for reference.     Microbiology: Recent Results (from the past 240  hour(s))  SARS CORONAVIRUS 2 (TAT 6-24 HRS) Nasopharyngeal Nasopharyngeal Swab     Status: Abnormal   Collection Time: 09/23/19  9:46 PM   Specimen: Nasopharyngeal Swab  Result Value Ref Range Status   SARS Coronavirus 2 POSITIVE (A) NEGATIVE Final    Comment: RESULT CALLED TO, READ BACK BY AND VERIFIED WITH: BDonnamarie Poag  4967 09/24/2019 T. TYSOR (NOTE) SARS-CoV-2 target nucleic acids are DETECTED. The SARS-CoV-2 RNA is generally detectable in upper and lower respiratory specimens during the acute phase of infection. Positive results are indicative of active infection with SARS-CoV-2. Clinical  correlation with patient history and other diagnostic information is necessary to determine patient infection status. Positive results do  not rule out bacterial infection or co-infection with other viruses. The expected result is Negative. Fact Sheet for Patients: SugarRoll.be Fact Sheet for Healthcare Providers: https://www.woods-mathews.com/ This test is not yet approved or cleared by the [place] FDA and  has been authorized for detection and/or diagnosis of SARS-CoV-2 by FDA under an Emergency Use Authorization (EUA). This EUA will remain  in effect (meaning this test can be used ) for the duration of the COVID-19 declaration under Section 564(b)(1) of the Act, 21 U.S.C. section 360bbb-3(b)(1), unless the authorization is terminated or revoked sooner. Performed at [facility], [place] [address], [place], [place] [address]   Surgical pcr screen     Status: None   Collection Time: 09/23/19  9:46 PM   Specimen: Nasal Mucosa; Nasal Swab  Result Value Ref Range Status   MRSA, PCR NEGATIVE NEGATIVE Final   Staphylococcus aureus NEGATIVE NEGATIVE Final    Comment: (NOTE) The Xpert SA Assay (FDA approved for NASAL specimens in patients 53 years of age and older), is one component of a comprehensive surveillance program. It is not  intended to diagnose infection nor to guide or monitor treatment. Performed at [facility], [place] [address], [place], [place] [address]      Labs: BNP (last 3 results) No results for input(s): BNP in the last 8760 hours. Basic Metabolic Panel: Recent Labs  Lab 09/25/19 0935  09/27/19 0424 09/28/19 0158 09/29/19 0308 09/30/19 0705 10/01/19 0005  NA 135   < > 134* 136 139 137 135  K 4.1   < > 3.9 4.5 4.4 4.2 4.2  CL 98   < > 97* 100 102 101 100  CO2 27   < > 27 23 24 23 24   GLUCOSE 151*   < > 128* 115* 65* 94 148*  BUN 30*   < > 37* 47* 44* 46* 51*  CREATININE 1.25*   < > 1.46* 1.65* 1.39* 1.31* 1.34*  CALCIUM 8.6*   < > 8.6* 8.6* 8.8* 8.6* 8.6*  MG  --   --   --  2.0 2.0 2.1 2.1  PHOS 3.5  --   --   --   --   --   --    < > = values in this interval not displayed.   Liver Function Tests: Recent Labs  Lab 09/25/19 0935 09/26/19 0912 09/27/19 0424  AST  --  48* 55*  ALT  --  25 29  ALKPHOS  --  50 49  BILITOT  --  1.1 0.6  PROT  --  6.5 6.1*  ALBUMIN 3.2* 3.1* 2.9*   No results for input(s): LIPASE, AMYLASE in the last 168 hours. No results for input(s): AMMONIA in the last 168 hours. CBC: Recent Labs  Lab 09/26/19 0912 09/27/19 0424 09/28/19 0158 09/29/19 0308 09/30/19 0705 10/01/19 0005  WBC 7.2 8.7 11.0* 10.9* 8.8 6.7  NEUTROABS 6.4 7.6  --   --   --   --   HGB 11.1* 10.5* 10.6* 12.4* 11.9* 11.8*  HCT 32.5* 30.8* 30.7* 36.3* 34.1* 33.5*  MCV 99.1 99.7 97.5 97.6 97.4 96.3  PLT 250 253 250 249 253 251   Cardiac Enzymes: No results for input(s): CKTOTAL, CKMB, CKMBINDEX, TROPONINI in the last 168 hours. BNP: Invalid input(s): POCBNP CBG: Recent Labs  Lab 09/30/19 2113 10/01/19 0731 10/01/19 1158 10/01/19 1547 10/01/19 1924  GLUCAP 148* 184* 119* 163* 196*   D-Dimer Recent Labs    09/30/19 0705 10/01/19 0005  DDIMER 0.61* 0.87*   Hgb A1c No results for input(s): HGBA1C in the last 72 hours. Lipid Profile No results  for input(s): CHOL, HDL, LDLCALC, TRIG, CHOLHDL, LDLDIRECT in the last 72 hours. Thyroid function studies No results for input(s): TSH, T4TOTAL, T3FREE, THYROIDAB in the last 72 hours.  Invalid input(s): FREET3 Anemia work up Recent Labs    09/30/19 0705 10/01/19 0005  FERRITIN 205 209   Urinalysis No results found for: COLORURINE, APPEARANCEUR, LABSPEC, PHURINE, GLUCOSEU, HGBUR, BILIRUBINUR, KETONESUR, PROTEINUR, UROBILINOGEN, NITRITE, LEUKOCYTESUR Sepsis Labs Invalid input(s): PROCALCITONIN,  WBC,  LACTICIDVEN Microbiology Recent Results (from the past 240 hour(s))  SARS CORONAVIRUS 2 (TAT 6-24 HRS) Nasopharyngeal Nasopharyngeal Swab     Status: Abnormal   Collection Time: 09/23/19  9:46 PM   Specimen: Nasopharyngeal Swab  Result Value Ref Range Status   SARS Coronavirus 2 POSITIVE (A) NEGATIVE Final    Comment: RESULT CALLED TO, READ BACK BY AND VERIFIED WITH: BJacquelynn Cree  1610 09/24/2019 T. TYSOR (NOTE) SARS-CoV-2 target nucleic acids are DETECTED. The SARS-CoV-2 RNA is generally detectable in upper and lower respiratory specimens during the acute phase of infection. Positive results are indicative of active infection with SARS-CoV-2. Clinical  correlation with patient history and other diagnostic information is necessary to determine patient infection status. Positive results do  not rule out bacterial infection or co-infection with other viruses. The expected result is Negative. Fact Sheet for Patients: HairSlick.no Fact Sheet for Healthcare Providers: quierodirigir.com This test is not yet approved or cleared by the [place] FDA and  has been authorized for detection and/or diagnosis of SARS-CoV-2 by FDA under an Emergency Use Authorization (EUA). This EUA will remain  in effect (meaning this test can be used ) for the duration of the COVID-19 declaration under Section 564(b)(1) of the Act, 21  U.S.C. section 360bbb-3(b)(1), unless the authorization is terminated or revoked  sooner. Performed at [facility], [address], [place], [place] [address]   Surgical pcr screen     Status: None   Collection Time: 09/23/19  9:46 PM   Specimen: Nasal Mucosa; Nasal Swab  Result Value Ref Range Status   MRSA, PCR NEGATIVE NEGATIVE Final   Staphylococcus aureus NEGATIVE NEGATIVE Final    Comment: (NOTE) The Xpert SA Assay (FDA approved for NASAL specimens in patients 60 years of age and older), is one component of a comprehensive surveillance program. It is not intended to diagnose infection nor to guide or monitor treatment. Performed at [facility], [address] [place], [place] [address]      Time coordinating discharge: 45 minutes  SIGNED:   Coralie Keens, MD  [facility] 10/02/2019, 7:25 AM

## 2019-10-03 LAB — GLUCOSE, CAPILLARY
Glucose-Capillary: 93 mg/dL (ref 70–99)
Glucose-Capillary: 93 mg/dL (ref 70–99)
Glucose-Capillary: 141 mg/dL — ABNORMAL HIGH (ref 70–99)

## 2019-10-03 MED ORDER — HALOPERIDOL LACTATE 5 MG/ML IJ SOLN: 3.0000 mg | Freq: Four times a day (QID) | INTRAMUSCULAR | Status: DC | PRN

## 2019-10-03 NOTE — Discharge Instructions (Signed)
Elevate leg and wiggle toes  Keep the cast clean and dry, please.   Please turn the patient and check for pressure sores, prop heels  Will need Lovenox for 30 days for DVT prophylaxis  No weight bearing on the right leg,   COVID-19 COVID-19 is a respiratory infection that is caused by a virus called severe acute respiratory syndrome coronavirus 2 (SARS-CoV-2). The disease is also known as coronavirus disease or novel coronavirus. In some people, the virus may not cause any symptoms. In others, it may cause a serious infection. The infection can get worse quickly and can lead to complications, such as:  Pneumonia, or infection of the lungs.  Acute respiratory distress syndrome or ARDS. This is fluid build-up in the lungs.  Acute respiratory failure. This is a condition in which there is not enough oxygen passing from the lungs to the body.  Sepsis or septic shock. This is a serious bodily reaction to an infection.  Blood clotting problems.  Secondary infections due to bacteria or fungus. The virus that causes COVID-19 is contagious. This means that it can spread from person to person through droplets from coughs and sneezes (respiratory secretions). What are the causes? This illness is caused by a virus. You may catch the virus by:  Breathing in droplets from an infected person's cough or sneeze.  Touching something, like a table or a doorknob, that was exposed to the virus (contaminated) and then touching your mouth, nose, or eyes. What increases the risk? Risk for infection You are more likely to be infected with this virus if you:  Live in or travel to an area with a COVID-19 outbreak.  Come in contact with a sick person who recently traveled to an area with a COVID-19 outbreak.  Provide care for or live with a person who is infected with COVID-19. Risk for serious illness You are more likely to become seriously ill from the virus if you:  Are 83 years of age or  older.  Have a long-term disease that lowers your body's ability to fight infection (immunocompromised).  Live in a nursing home or long-term care facility.  Have a long-term (chronic) disease such as: ? Chronic lung disease, including chronic obstructive pulmonary disease or asthma ? Heart disease. ? Diabetes. ? Chronic kidney disease. ? Liver disease.  Are obese. What are the signs or symptoms? Symptoms of this condition can range from mild to severe. Symptoms may appear any time from 2 to 14 days after being exposed to the virus. They include:  A fever.  A cough.  Difficulty breathing.  Chills.  Muscle pains.  A sore throat.  Loss of taste or smell. Some people may also have stomach problems, such as nausea, vomiting, or diarrhea. Other people may not have any symptoms of COVID-19. How is this diagnosed? This condition may be diagnosed based on:  Your signs and symptoms, especially if: ? You live in an area with a COVID-19 outbreak. ? You recently traveled to or from an area where the virus is common. ? You provide care for or live with a person who was diagnosed with COVID-19.  A physical exam.  Lab tests, which may include: ? A nasal swab to take a sample of fluid from your nose. ? A throat swab to take a sample of fluid from your throat. ? A sample of mucus from your lungs (sputum). ? Blood tests.  Imaging tests, which may include, X-rays, CT scan, or ultrasound. How is this  treated? At present, there is no medicine to treat COVID-19. Medicines that treat other diseases are being used on a trial basis to see if they are effective against COVID-19. Your health care provider will talk with you about ways to treat your symptoms. For most people, the infection is mild and can be managed at home with rest, fluids, and over-the-counter medicines. Treatment for a serious infection usually takes places in a hospital intensive care unit (ICU). It may include one or  more of the following treatments. These treatments are given until your symptoms improve.  Receiving fluids and medicines through an IV.  Supplemental oxygen. Extra oxygen is given through a tube in the nose, a face mask, or a hood.  Positioning you to lie on your stomach (prone position). This makes it easier for oxygen to get into the lungs.  Continuous positive airway pressure (CPAP) or bi-level positive airway pressure (BPAP) machine. This treatment uses mild air pressure to keep the airways open. A tube that is connected to a motor delivers oxygen to the body.  Ventilator. This treatment moves air into and out of the lungs by using a tube that is placed in your windpipe.  Tracheostomy. This is a procedure to create a hole in the neck so that a breathing tube can be inserted.  Extracorporeal membrane oxygenation (ECMO). This procedure gives the lungs a chance to recover by taking over the functions of the heart and lungs. It supplies oxygen to the body and removes carbon dioxide. Follow these instructions at home: Lifestyle  If you are sick, stay home except to get medical care. Your health care provider will tell you how long to stay home. Call your health care provider before you go for medical care.  Rest at home as told by your health care provider.  Do not use any products that contain nicotine or tobacco, such as cigarettes, e-cigarettes, and chewing tobacco. If you need help quitting, ask your health care provider.  Return to your normal activities as told by your health care provider. Ask your health care provider what activities are safe for you. General instructions  Take over-the-counter and prescription medicines only as told by your health care provider.  Drink enough fluid to keep your urine pale yellow.  Keep all follow-up visits as told by your health care provider. This is important. How is this prevented?  There is no vaccine to help prevent COVID-19 infection.  However, there are steps you can take to protect yourself and others from this virus. To protect yourself:   Do not travel to areas where COVID-19 is a risk. The areas where COVID-19 is reported change often. To identify high-risk areas and travel restrictions, check the CDC travel website: FatFares.com.br  If you live in, or must travel to, an area where COVID-19 is a risk, take precautions to avoid infection. ? Stay away from people who are sick. ? Wash your hands often with soap and water for 20 seconds. If soap and water are not available, use an alcohol-based hand sanitizer. ? Avoid touching your mouth, face, eyes, or nose. ? Avoid going out in public, follow guidance from your state and local health authorities. ? If you must go out in public, wear a cloth face covering or face mask. ? Disinfect objects and surfaces that are frequently touched every day. This may include:  Counters and tables.  Doorknobs and light switches.  Sinks and faucets.  Electronics, such as phones, remote controls, keyboards, computers, and  tablets. To protect others: If you have symptoms of COVID-19, take steps to prevent the virus from spreading to others.  If you think you have a COVID-19 infection, contact your health care provider right away. Tell your health care team that you think you may have a COVID-19 infection.  Stay home. Leave your house only to seek medical care. Do not use public transport.  Do not travel while you are sick.  Wash your hands often with soap and water for 20 seconds. If soap and water are not available, use alcohol-based hand sanitizer.  Stay away from other members of your household. Let healthy household members care for children and pets, if possible. If you have to care for children or pets, wash your hands often and wear a mask. If possible, stay in your own room, separate from others. Use a different bathroom.  Make sure that all people in your  household wash their hands well and often.  Cough or sneeze into a tissue or your sleeve or elbow. Do not cough or sneeze into your hand or into the air.  Wear a cloth face covering or face mask. Where to find more information  Centers for Disease Control and Prevention: StickerEmporium.tn  World Health Organization: https://thompson-craig.com/ Contact a health care provider if:  You live in or have traveled to an area where COVID-19 is a risk and you have symptoms of the infection.  You have had contact with someone who has COVID-19 and you have symptoms of the infection. Get help right away if:  You have trouble breathing.  You have pain or pressure in your chest.  You have confusion.  You have bluish lips and fingernails.  You have difficulty waking from sleep.  You have symptoms that get worse. These symptoms may represent a serious problem that is an emergency. Do not wait to see if the symptoms will go away. Get medical help right away. Call your local emergency services (911 in the [place] Do not drive yourself to the hospital. Let the emergency medical personnel know if you think you have COVID-19. Summary  COVID-19 is a respiratory infection that is caused by a virus. It is also known as coronavirus disease or novel coronavirus. It can cause serious infections, such as pneumonia, acute respiratory distress syndrome, acute respiratory failure, or sepsis.  The virus that causes COVID-19 is contagious. This means that it can spread from person to person through droplets from coughs and sneezes.  You are more likely to develop a serious illness if you are 22 years of age or older, have a weak immunity, live in a nursing home, or have chronic disease.  There is no medicine to treat COVID-19. Your health care provider will talk with you about ways to treat your symptoms.  Take steps to protect yourself and others from infection. Wash your  hands often and disinfect objects and surfaces that are frequently touched every day. Stay away from people who are sick and wear a mask if you are sick. This information is not intended to replace advice given to you by your health care provider. Make sure you discuss any questions you have with your health care provider. Document Released: 12/05/2018 Document Revised: 03/27/2019 Document Reviewed: 12/05/2018 Elsevier Patient Education  2020 [employer] wheel chair transfers only   Follow up in the office with Dr Ranell Patrick in two weeks, call [phone] for appt

## 2019-10-03 NOTE — Progress Notes (Signed)
Called and spoke with Baxter Flattery at [facility] to see if they could still take patient this late in night.  She called and spoke with their Director of Nursing.  We were told they could not take patient after 9pm at night.  PTAR made aware.  Baxter Flattery stated they would be happy to accept patient tomorrow during the day.  Earleen Reaper RN

## 2019-10-03 NOTE — Plan of Care (Signed)
Pt became increasingly confused and restless last night, trying to get out of bed. Redirection given, needs addressed, minimal improvement. PRN Haldol given, pt calmed done eventually, more cooperative with cares and went to sleep. No complaints of pain verbalized. Remained alert and oriented to person and place only. Vitals stable on RA. Moderate assist with ADLs. Sacral foam dressing changed. R leg long cast intact, neurovascularly stable. IV SL. Assisted regular repositioning for pressure relief. Mitts discontinued. No other issues, will monitor.   Problem: Activity: Goal: Risk for activity intolerance will decrease Outcome: Progressing   Problem: Coping: Goal: Level of anxiety will decrease Outcome: Progressing   Problem: Safety: Goal: Ability to remain free from injury will improve Outcome: Progressing   Problem: Skin Integrity: Goal: Risk for impaired skin integrity will decrease Outcome: Progressing   Problem: Education: Goal: Knowledge of risk factors and measures for prevention of condition will improve Outcome: Progressing

## 2019-10-03 NOTE — Progress Notes (Signed)
Report called to [place]. Pt. Stable. Eyeglasses put in a patients belongings back. No other belongings.

## 2019-10-03 NOTE — Progress Notes (Signed)
Patient is medically stable for discharge today to SNF.

## 2019-10-03 NOTE — TOC Transition Note (Signed)
Transition of Care [facility]) - CM/SW Discharge Note   Patient Details  Name: Jason Vasquez MRN: [id number] Date of Birth: 16-Nov-1928  Transition of Care [facility]) CM/SW Contact:  Weston Anna, LCSW Phone Number: 10/03/2019, 3:33 PM   Clinical Narrative:     Patient set to discharge to [facility]- number for report is [phone]. PTAR called for transportation and daughter, Robet Leu, notified of discharge. No other needs at this time.  Final next level of care: [place] Barriers to Discharge: No Barriers Identified   Patient Goals and CMS Choice     Choice offered to / list presented to : Adult Children  Discharge Placement                       Discharge Plan and Services   Discharge Planning Services: CM Consult                                 Social Determinants of Health (SDOH) Interventions     Readmission Risk Interventions No flowsheet data found.

## 2019-10-03 NOTE — Progress Notes (Addendum)
UPDATE 3:45PM: Patient spoke extensively with patients sister, Luellen Pucker, regarding discharge to [facility]- sister is not agreeable to discharge at this time due to financial concerns. Patient/ family may be responsible for room and board at [facility] states that she does not want to be held responsible for this/ sign paperwork that will say she is responsible. Luellen Pucker has reached out to DSS to determine if patients Medicaid can be transferred to her, in order to pay for any needs, however CSW informed Luellen Pucker this could take some time and potentially not be completed over the next few days. CSW informed Luellen Pucker that new appeal would need to be completed due to discharge orders/ summary being active- Luellen Pucker voiced understanding.   CSW aware patient medically stable for DC. Patient has [facility] insurance which has to approve which SNF patient goes to. At this time, [facility] is attempting to contract with [facility] for patients stay. CSW continues to follow up to determine where we are at with contract being completed.   Kingsley Spittle, LCSW [facility]  [phone]

## 2019-10-03 NOTE — Progress Notes (Addendum)
PTAR at bedside.  Charge called [facility] to ensure they could accept the patient.  Turns out they can't accept the patient after 2100.  Page sent to Dr. Shanon Brow notifying her of this, and requested IV haldol be changed to IM haldol.  Baldwin Crown, daughter, called to update her of this.  She thought he was already at the [facility].   All questions answered and no further concerns at this time.  [place] trending BP, see flowsheets.  Paged Dr. Shanon Brow.  Asymptomatic.  This is after isosorbide/hydralazine, trazadone, norco.  Will continue to monitor.

## 2019-10-03 NOTE — Progress Notes (Signed)
RN spoke with daughter. Updates given. Waiting on a bed for placement.

## 2019-10-04 LAB — GLUCOSE, CAPILLARY: Glucose-Capillary: 123 mg/dL — ABNORMAL HIGH (ref 70–99)

## 2019-10-04 NOTE — Progress Notes (Signed)
Pt. Is ready for transport. Meds that were given prior to departure, written down in the med reconciliation sheet.

## 2019-10-06 LAB — GLUCOSE, CAPILLARY: Glucose-Capillary: 141 mg/dL — ABNORMAL HIGH (ref 70–99)

## 2019-10-17 ENCOUNTER — Encounter: Payer: Self-pay | Admitting: Internal Medicine

## 2019-10-17 NOTE — Progress Notes (Signed)
: Provider:  Margit Hanks., MD Location:  [facility] Room Number: 509-P Place of Service:  SNF [phone]  PCP: Miguel Aschoff, MD Patient Care Team: Miguel Aschoff, MD as PCP - General (Internal Medicine)  Extended Emergency Contact Information Primary Emergency Contact: no,contact Home Phone: [phone] Relation: None Secondary Emergency Contact: BRILEY,HENRETTA Mobile Phone: [phone] Relation: Daughter     Allergies: Ace inhibitors, Ciprofloxacin, Levofloxacin, Metformin, Neomy-bacit-polymyx-pramoxine, and Tramadol  Chief Complaint  Patient presents with   New Admit To SNF    New admission to [facility]     HPI: Patient is an 85 y.o. male   Past Medical History:  Diagnosis Date   CKD (chronic kidney disease)    COPD (chronic obstructive pulmonary disease) (HCC)    Dementia (HCC)    Hypertension     Past Surgical History:  Procedure Laterality Date   QUADRICEPS TENDON REPAIR Right 09/24/2019   Procedure: REPAIR RIGHT QUADRICEP TENDON;  Surgeon: Beverely Low, MD;  Location: [facility]S;  Service: Orthopedics;  Laterality: Right;    Allergies as of 10/17/2019      Reactions   Ace Inhibitors Cough   Ciprofloxacin Rash   Other reaction(s): RASH   Levofloxacin Rash   Other reaction(s): RASH   Metformin Diarrhea   Other reaction(s): DIARRHEA   Neomy-bacit-polymyx-pramoxine Rash   Patient is allergic to all antibiotics except Doxycycline Patient is allergic to all antibiotics except Doxycycline   Tramadol Nausea Only   Other reaction(s): NAUSEA      Medication List       Accurate as of October 17, 2019  2:27 PM. If you have any questions, ask your nurse or doctor.        acetaminophen 650 MG CR tablet Commonly known as: TYLENOL Take 650 mg by mouth every 8 (eight) hours as needed for pain.   BIOFREEZE EX Apply 1 application topically 4 (four) times daily. Each knee   chlorthalidone 50 MG  tablet Commonly known as: HYGROTON Take 50 mg by mouth daily.   citalopram 10 MG tablet Commonly known as: CELEXA Take 10 mg by mouth daily.   diltiazem 420 MG 24 hr tablet Commonly known as: CARDIZEM LA Take 420 mg by mouth daily.   enoxaparin 30 MG/0.3ML injection Commonly known as: Lovenox Inject 0.3 mLs (30 mg total) into the skin every 12 (twelve) hours.   famotidine 10 MG tablet Commonly known as: PEPCID Take 10 mg by mouth 2 (two) times daily.   finasteride 5 MG tablet Commonly known as: PROSCAR Take 5 mg by mouth at bedtime.   fluticasone 50 MCG/ACT nasal spray Commonly known as: FLONASE Place 1 spray into both nostrils daily.   methocarbamol 500 MG tablet Commonly known as: ROBAXIN Take 1 tablet (500 mg total) by mouth every 6 (six) hours as needed for muscle spasms.   telmisartan 80 MG tablet Commonly known as: MICARDIS Take 80 mg by mouth daily.   tolnaftate 1 % powder Commonly known as: TINACTIN Apply 1 application topically 2 (two) times daily. feet   traZODone 50 MG tablet Commonly known as: DESYREL Take 25 mg by mouth at bedtime.   Vitamin D3 1.25 MG (50000 UT) Caps Take 1.25 mg by mouth every 30 (thirty) days.       No orders of the defined types were placed in this encounter.    There is no immunization history on file for this patient.  Social History   Tobacco Use  Smoking status: Never Smoker   Smokeless tobacco: Never Used  Substance Use Topics   Alcohol use: Never    Frequency: Never    Family history is   No family history on file.    Review of Systems  DATA OBTAINED: from patient, nurse, medical record, family member GENERAL:  no fevers, fatigue, appetite changes SKIN: No itching, or rash EYES: No eye pain, redness, discharge EARS: No earache, tinnitus, change in hearing NOSE: No congestion, drainage or bleeding  MOUTH/THROAT: No mouth or tooth pain, No sore throat RESPIRATORY: No cough, wheezing,  SOB CARDIAC: No chest pain, palpitations, lower extremity edema  GI: No abdominal pain, No N/V/D or constipation, No heartburn or reflux  GU: No dysuria, frequency or urgency, or incontinence  MUSCULOSKELETAL: No unrelieved bone/joint pain NEUROLOGIC: No headache, dizziness or focal weakness PSYCHIATRIC: No c/o anxiety or sadness   Vitals:   10/17/19 1422  BP: 140/62  Pulse: 78  Temp: 98 F (36.7 C)    SpO2 Readings from Last 1 Encounters:  10/04/19 95%   Body mass index is 30.41 kg/m.     Physical Exam  GENERAL APPEARANCE: Alert, conversant,  No acute distress.  SKIN: No diaphoresis rash HEAD: Normocephalic, atraumatic  EYES: Conjunctiva/lids clear. Pupils round, reactive. EOMs intact.  EARS: External exam WNL, canals clear. Hearing grossly normal.  NOSE: No deformity or discharge.  MOUTH/THROAT: Lips w/o lesions  RESPIRATORY: Breathing is even, unlabored. Lung sounds are clear   CARDIOVASCULAR: Heart RRR no murmurs, rubs or gallops. No peripheral edema.   GASTROINTESTINAL: Abdomen is soft, non-tender, not distended w/ normal bowel sounds. GENITOURINARY: Bladder non tender, not distended  MUSCULOSKELETAL: No abnormal joints or musculature NEUROLOGIC:  Cranial nerves 2-12 grossly intact. Moves all extremities  PSYCHIATRIC: Mood and affect appropriate to situation, no behavioral issues  Patient Active Problem List   Diagnosis Date Noted   COVID-19 virus infection 09/29/2019   Delirium 09/28/2019   CKD (chronic kidney disease), stage III 09/27/2019   HLD (hyperlipidemia) 09/27/2019   DM2 (diabetes mellitus, type 2) (HCC) 09/27/2019   BPH (benign prostatic hyperplasia) 09/27/2019   Pressure injury of skin 09/27/2019   Quadriceps muscle rupture, right, initial encounter 09/24/2019   Quadriceps tendon rupture, right, initial encounter 09/23/2019      Labs reviewed: Basic Metabolic Panel:    Component Value Date/Time   NA 135 10/01/2019 0005   K 4.2  10/01/2019 0005   CL 100 10/01/2019 0005   CO2 24 10/01/2019 0005   GLUCOSE 148 (H) 10/01/2019 0005   BUN 51 (H) 10/01/2019 0005   CREATININE 1.34 (H) 10/01/2019 0005   CALCIUM 8.6 (L) 10/01/2019 0005   PROT 6.1 (L) 09/27/2019 0424   ALBUMIN 2.9 (L) 09/27/2019 0424   AST 55 (H) 09/27/2019 0424   ALT 29 09/27/2019 0424   ALKPHOS 49 09/27/2019 0424   BILITOT 0.6 09/27/2019 0424   GFRNONAA 47 (L) 10/01/2019 0005   GFRAA 54 (L) 10/01/2019 0005    Recent Labs    09/25/19 0935  09/29/19 0308 09/30/19 0705 10/01/19 0005  NA 135   < > 139 137 135  K 4.1   < > 4.4 4.2 4.2  CL 98   < > 102 101 100  CO2 27   < > GLUCOSE 151*   < > 65* 94 148*  BUN 30*   < > 44* 46* 51*  CREATININE 1.25*   < > 1.39* 1.31* 1.34*  CALCIUM 8.6*   < >  8.8* 8.6* 8.6*  MG  --    < > 2.0 2.1 2.1  PHOS 3.5  --   --   --   --    < > = values in this interval not displayed.   Liver Function Tests: Recent Labs    09/25/19 0935 09/26/19 0912 09/27/19 0424  AST  --  48* 55*  ALT  --  25 29  ALKPHOS  --  50 49  BILITOT  --  1.1 0.6  PROT  --  6.5 6.1*  ALBUMIN 3.2* 3.1* 2.9*   No results for input(s): LIPASE, AMYLASE in the last 8760 hours. No results for input(s): AMMONIA in the last 8760 hours. CBC: Recent Labs    09/23/19 1838  09/26/19 0912 09/27/19 0424  09/29/19 0308 09/30/19 0705 10/01/19 0005  WBC 4.0   < > 7.2 8.7   < > 10.9* 8.8 6.7  NEUTROABS 2.1  --  6.4 7.6  --   --   --   --   HGB 12.0*   < > 11.1* 10.5*   < > 12.4* 11.9* 11.8*  HCT 36.0*   < > 32.5* 30.8*   < > 36.3* 34.1* 33.5*  MCV 101.4*   < > 99.1 99.7   < > 97.6 97.4 96.3  PLT 292   < > 250 253   < > 249 253 251   < > = values in this interval not displayed.   Lipid No results for input(s): CHOL, HDL, LDLCALC, TRIG in the last 8760 hours.  Cardiac Enzymes: No results for input(s): CKTOTAL, CKMB, CKMBINDEX, TROPONINI in the last 8760 hours. BNP: No results for input(s): BNP in the last 8760 hours. No  results found for: [facility] Lab Results  Component Value Date   HGBA1C 6.4 (H) 09/27/2019   No results found for: TSH No results found for: VITAMINB12 No results found for: FOLATE Lab Results  Component Value Date   FERRITIN 209 10/01/2019    Imaging and Procedures obtained prior to SNF admission: Dg Chest 2 View  Result Date: 09/23/2019 CLINICAL DATA:  85 year old male under preoperative evaluation. EXAM: CHEST - 2 VIEW COMPARISON:  Chest x-ray 11/30/2017. FINDINGS: Lung volumes are low. No acute consolidative airspace disease. No pleural effusions. Mild diffuse interstitial prominence and peribronchial cuffing. No pneumothorax. No evidence of pulmonary edema. Heart size is normal. The patient is rotated to the right on today's exam, resulting in distortion of the mediastinal contours and reduced diagnostic sensitivity and specificity for mediastinal pathology. IMPRESSION: 1. Mild diffuse interstitial prominence and peribronchial cuffing, concerning for an acute bronchitis. Electronically Signed   By: Trudie Reedaniel  Entrikin M.D.   On: 09/23/2019 20:13   Vas [place] Lower Extremity Venous (dvt)  Result Date: 09/25/2019  Lower Venous Study Indications: Swelling, and Tendon tear.  Risk Factors: Trauma. Limitations: Poor ultrasound/tissue interface and patient positioning. Comparison Study: No prior studies. Performing Technologist: Chanda BusingGregory Collins RVT  Examination Guidelines: A complete evaluation includes B-mode imaging, spectral Doppler, color Doppler, and power Doppler as needed of all accessible portions of each vessel. Bilateral testing is considered an integral part of a complete examination. Limited examinations for reoccurring indications may be performed as noted.  +---------+---------------+---------+-----------+----------+--------------+  RIGHT     Compressibility Phasicity Spontaneity Properties Thrombus Aging  +---------+---------------+---------+-----------+----------+--------------+  CFV        Full            Yes       Yes                                    +---------+---------------+---------+-----------+----------+--------------+  SFJ       Full                                                             +---------+---------------+---------+-----------+----------+--------------+  FV Prox   Full                                                             +---------+---------------+---------+-----------+----------+--------------+  FV Mid    Full                                                             +---------+---------------+---------+-----------+----------+--------------+  FV Distal Full                                                             +---------+---------------+---------+-----------+----------+--------------+  PFV       Full                                                             +---------+---------------+---------+-----------+----------+--------------+  POP       Full            Yes       Yes                                    +---------+---------------+---------+-----------+----------+--------------+  PTV       Full                                                             +---------+---------------+---------+-----------+----------+--------------+  PERO      Full                                                             +---------+---------------+---------+-----------+----------+--------------+   +---------+---------------+---------+-----------+----------+--------------+  LEFT      Compressibility Phasicity Spontaneity Properties Thrombus Aging  +---------+---------------+---------+-----------+----------+--------------+  CFV       Full            Yes       Yes                                    +---------+---------------+---------+-----------+----------+--------------+  SFJ       Full                                                             +---------+---------------+---------+-----------+----------+--------------+  FV Prox   Full                                                              +---------+---------------+---------+-----------+----------+--------------+  FV Mid    Full                                                             +---------+---------------+---------+-----------+----------+--------------+  FV Distal Full                                                             +---------+---------------+---------+-----------+----------+--------------+  PFV       Full                                                             +---------+---------------+---------+-----------+----------+--------------+  POP       Full            Yes       Yes                                    +---------+---------------+---------+-----------+----------+--------------+  PTV       Full                                                             +---------+---------------+---------+-----------+----------+--------------+  PERO      Full                                                             +---------+---------------+---------+-----------+----------+--------------+     Summary: Right: There is no evidence of deep vein thrombosis in the lower extremity. However, portions of this examination were limited- see technologist comments above. No cystic structure found in the popliteal fossa. Left: There is no evidence of deep vein thrombosis in the lower extremity. However, portions of this examination were limited-  see technologist comments above. No cystic structure found in the popliteal fossa.  *See table(s) above for measurements and observations. Electronically signed by Curt Jews MD on 09/25/2019 at 6:52:59 AM.    Final      Not all labs, radiology exams or other studies done during hospitalization come through on my EPIC note; however they are reviewed by me.    Assessment and Plan  No problem-specific Assessment & Plan notes found for this encounter.   Hennie Duos, MD

## 2019-10-17 NOTE — Progress Notes (Deleted)
[facility] Room Number: 509-P Place of Service:  SNF (31)  Patient Care Team: Miguel AschoffWilliams, Julie Anne, MD as PCP - General (Internal Medicine)  Extended Emergency Contact Information Primary Emergency Contact: no,contact Home Phone: [phone] Relation: None Secondary Emergency Contact: BRILEY,HENRETTA Mobile Phone: [phone] Relation: Daughter    Allergies: Ace inhibitors, Ciprofloxacin, Levofloxacin, Metformin, Neomy-bacit-polymyx-pramoxine, and Tramadol  Chief Complaint  Patient presents with   New Admit To SNF    New admission to [facility]     HPI: Patient is an 85 y.o. male who   Past Medical History:  Diagnosis Date   CKD (chronic kidney disease)    COPD (chronic obstructive pulmonary disease) (HCC)    Dementia (HCC)    Hypertension     Past Surgical History:  Procedure Laterality Date   QUADRICEPS TENDON REPAIR Right 09/24/2019   Procedure: REPAIR RIGHT QUADRICEP TENDON;  Surgeon: Beverely LowNorris, Steve, MD;  Location: [facility] ORS;  Service: Orthopedics;  Laterality: Right;    Allergies as of 10/17/2019      Reactions   Ace Inhibitors Cough   Ciprofloxacin Rash   Other reaction(s): RASH   Levofloxacin Rash   Other reaction(s): RASH   Metformin Diarrhea   Other reaction(s): DIARRHEA   Neomy-bacit-polymyx-pramoxine Rash   Patient is allergic to all antibiotics except Doxycycline Patient is allergic to all antibiotics except Doxycycline   Tramadol Nausea Only   Other reaction(s): NAUSEA      Medication List       Accurate as of October 17, 2019  2:23 PM. If you have any questions, ask your nurse or doctor.        acetaminophen 650 MG CR tablet Commonly known as: TYLENOL Take 650 mg by mouth every 8 (eight) hours as needed for pain.   BIOFREEZE EX Apply 1 application topically 4 (four) times daily. Each knee   chlorthalidone 50 MG tablet Commonly known as: HYGROTON Take 50 mg by mouth daily.     citalopram 10 MG tablet Commonly known as: CELEXA Take 10 mg by mouth daily.   diltiazem 420 MG 24 hr tablet Commonly known as: CARDIZEM LA Take 420 mg by mouth daily.   enoxaparin 30 MG/0.3ML injection Commonly known as: Lovenox Inject 0.3 mLs (30 mg total) into the skin every 12 (twelve) hours.   famotidine 10 MG tablet Commonly known as: PEPCID Take 10 mg by mouth 2 (two) times daily.   finasteride 5 MG tablet Commonly known as: PROSCAR Take 5 mg by mouth at bedtime.   fluticasone 50 MCG/ACT nasal spray Commonly known as: FLONASE Place 1 spray into both nostrils daily.   methocarbamol 500 MG tablet Commonly known as: ROBAXIN Take 1 tablet (500 mg total) by mouth every 6 (six) hours as needed for muscle spasms.   telmisartan 80 MG tablet Commonly known as: MICARDIS Take 80 mg by mouth daily.   tolnaftate 1 % powder Commonly known as: TINACTIN Apply 1 application topically 2 (two) times daily. feet   traZODone 50 MG tablet Commonly known as: DESYREL Take 25 mg by mouth at bedtime.   Vitamin D3 1.25 MG (50000 UT) Caps Take 1.25 mg by mouth every 30 (thirty) days.       No orders of the defined types were placed in this encounter.    There is no immunization history on file for this patient.  Social History   Tobacco Use   Smoking status: Never Smoker   Smokeless tobacco: Never  Used  Substance Use Topics   Alcohol use: Never    Frequency: Never    Review of Systems  DATA OBTAINED: from patient, nurse, medical record, family member GENERAL:  no fevers, fatigue, appetite changes SKIN: No itching, rash HEENT: No complaint RESPIRATORY: No cough, wheezing, SOB CARDIAC: No chest pain, palpitations, lower extremity edema  GI: No abdominal pain, No N/V/D or constipation, No heartburn or reflux  GU: No dysuria, frequency or urgency, or incontinence  MUSCULOSKELETAL: No unrelieved bone/joint pain NEUROLOGIC: No headache, dizziness  PSYCHIATRIC: No  overt anxiety or sadness  Vitals:   10/17/19 1422  BP: 140/62  Pulse: 78  Temp: 98 F (36.7 C)   Body mass index is 30.41 kg/m. Physical Exam  GENERAL APPEARANCE: Alert, conversant, No acute distress  SKIN: No diaphoresis rash HEENT: Unremarkable RESPIRATORY: Breathing is even, unlabored. Lung sounds are clear   CARDIOVASCULAR: Heart RRR no murmurs, rubs or gallops. No peripheral edema  GASTROINTESTINAL: Abdomen is soft, non-tender, not distended w/ normal bowel sounds.  GENITOURINARY: Bladder non tender, not distended  MUSCULOSKELETAL: No abnormal joints or musculature NEUROLOGIC: Cranial nerves 2-12 grossly intact. Moves all extremities PSYCHIATRIC: Mood and affect appropriate to situation, no behavioral issues  Patient Active Problem List   Diagnosis Date Noted   COVID-19 virus infection 09/29/2019   Delirium 09/28/2019   CKD (chronic kidney disease), stage III 09/27/2019   HLD (hyperlipidemia) 09/27/2019   DM2 (diabetes mellitus, type 2) (HCC) 09/27/2019   BPH (benign prostatic hyperplasia) 09/27/2019   Pressure injury of skin 09/27/2019   Quadriceps muscle rupture, right, initial encounter 09/24/2019   Quadriceps tendon rupture, right, initial encounter 09/23/2019    CMP     Component Value Date/Time   NA 135 10/01/2019 0005   K 4.2 10/01/2019 0005   CL 100 10/01/2019 0005   CO2 24 10/01/2019 0005   GLUCOSE 148 (H) 10/01/2019 0005   BUN 51 (H) 10/01/2019 0005   CREATININE 1.34 (H) 10/01/2019 0005   CALCIUM 8.6 (L) 10/01/2019 0005   PROT 6.1 (L) 09/27/2019 0424   ALBUMIN 2.9 (L) 09/27/2019 0424   AST 55 (H) 09/27/2019 0424   ALT 29 09/27/2019 0424   ALKPHOS 49 09/27/2019 0424   BILITOT 0.6 09/27/2019 0424   GFRNONAA 47 (L) 10/01/2019 0005   GFRAA 54 (L) 10/01/2019 0005   Recent Labs    09/25/19 0935  09/29/19 0308 09/30/19 0705 10/01/19 0005  NA 135   < > 139 137 135  K 4.1   < > 4.4 4.2 4.2  CL 98   < > 102 101 100  CO2 27   < > GLUCOSE 151*   < > 65* 94 148*  BUN 30*   < > 44* 46* 51*  CREATININE 1.25*   < > 1.39* 1.31* 1.34*  CALCIUM 8.6*   < > 8.8* 8.6* 8.6*  MG  --    < > 2.0 2.1 2.1  PHOS 3.5  --   --   --   --    < > = values in this interval not displayed.   Recent Labs    09/25/19 0935 09/26/19 0912 09/27/19 0424  AST  --  48* 55*  ALT  --  25 29  ALKPHOS  --  50 49  BILITOT  --  1.1 0.6  PROT  --  6.5 6.1*  ALBUMIN 3.2* 3.1* 2.9*   Recent Labs    09/23/19 1838  09/26/19 0912 09/27/19  0424  09/29/19 0308 09/30/19 0705 10/01/19 0005  WBC 4.0   < > 7.2 8.7   < > 10.9* 8.8 6.7  NEUTROABS 2.1  --  6.4 7.6  --   --   --   --   HGB 12.0*   < > 11.1* 10.5*   < > 12.4* 11.9* 11.8*  HCT 36.0*   < > 32.5* 30.8*   < > 36.3* 34.1* 33.5*  MCV 101.4*   < > 99.1 99.7   < > 97.6 97.4 96.3  PLT 292   < > 250 253   < > 249 253 251   < > = values in this interval not displayed.   No results for input(s): CHOL, LDLCALC, TRIG in the last 8760 hours.  Invalid input(s): HCL No results found for: MICROALBUR No results found for: TSH Lab Results  Component Value Date   HGBA1C 6.4 (H) 09/27/2019   No results found for: CHOL, HDL, LDLCALC, LDLDIRECT, TRIG, CHOLHDL  Significant Diagnostic Results in last 30 days:  Dg Chest 2 View  Result Date: 09/23/2019 CLINICAL DATA:  85 year old male under preoperative evaluation. EXAM: CHEST - 2 VIEW COMPARISON:  Chest x-ray 11/30/2017. FINDINGS: Lung volumes are low. No acute consolidative airspace disease. No pleural effusions. Mild diffuse interstitial prominence and peribronchial cuffing. No pneumothorax. No evidence of pulmonary edema. Heart size is normal. The patient is rotated to the right on today's exam, resulting in distortion of the mediastinal contours and reduced diagnostic sensitivity and specificity for mediastinal pathology. IMPRESSION: 1. Mild diffuse interstitial prominence and peribronchial cuffing, concerning for an acute bronchitis. Electronically  Signed   By: Trudie Reed M.D.   On: 09/23/2019 20:13   Dg Ankle Complete Right  Result Date: 09/17/2019 CLINICAL DATA:  Persistent pain EXAM: RIGHT ANKLE - COMPLETE 3+ VIEW COMPARISON:  None. FINDINGS: Diffuse soft tissue swelling. No fracture or malalignment. Ankle mortise is symmetric. IMPRESSION: Soft tissue swelling.  No acute osseous abnormality. Electronically Signed   By: Jasmine Pang M.D.   On: 09/17/2019 15:25   Dg Knee 4 Views W/patella Right  Result Date: 09/18/2019 CLINICAL DATA:  Acute RIGHT knee pain following fall today. Initial encounter. EXAM: RIGHT KNEE - COMPLETE 4+ VIEW COMPARISON:  09/15/2019 FINDINGS: No acute fracture, subluxation or dislocation. No definite knee effusion is noted. Probable soft tissue swelling noted. Mild tricompartmental joint space narrowing noted. IMPRESSION: Probable soft tissue swelling without acute bony abnormality. Electronically Signed   By: Harmon Pier M.D.   On: 09/18/2019 16:50   Dg Foot Complete Right  Result Date: 09/17/2019 CLINICAL DATA:  Persistent pain, swelling across the metatarsals EXAM: RIGHT FOOT COMPLETE - 3+ VIEW COMPARISON:  None. FINDINGS: No fracture or malalignment. Mild degenerative change at the first MTP joint. Dorsal soft tissue swelling IMPRESSION: No acute osseous abnormality Electronically Signed   By: Jasmine Pang M.D.   On: 09/17/2019 15:25   Vas [place] Lower Extremity Venous (dvt)  Result Date: 09/25/2019  Lower Venous Study Indications: Swelling, and Tendon tear.  Risk Factors: Trauma. Limitations: Poor ultrasound/tissue interface and patient positioning. Comparison Study: No prior studies. Performing Technologist: Chanda Busing RVT  Examination Guidelines: A complete evaluation includes B-mode imaging, spectral Doppler, color Doppler, and power Doppler as needed of all accessible portions of each vessel. Bilateral testing is considered an integral part of a complete examination. Limited examinations for  reoccurring indications may be performed as noted.  +---------+---------------+---------+-----------+----------+--------------+  RIGHT     Compressibility Phasicity Spontaneity Properties  Thrombus Aging  +---------+---------------+---------+-----------+----------+--------------+  CFV       Full            Yes       Yes                                    +---------+---------------+---------+-----------+----------+--------------+  SFJ       Full                                                             +---------+---------------+---------+-----------+----------+--------------+  FV Prox   Full                                                             +---------+---------------+---------+-----------+----------+--------------+  FV Mid    Full                                                             +---------+---------------+---------+-----------+----------+--------------+  FV Distal Full                                                             +---------+---------------+---------+-----------+----------+--------------+  PFV       Full                                                             +---------+---------------+---------+-----------+----------+--------------+  POP       Full            Yes       Yes                                    +---------+---------------+---------+-----------+----------+--------------+  PTV       Full                                                             +---------+---------------+---------+-----------+----------+--------------+  PERO      Full                                                             +---------+---------------+---------+-----------+----------+--------------+   +---------+---------------+---------+-----------+----------+--------------+  LEFT      Compressibility Phasicity Spontaneity Properties Thrombus Aging  +---------+---------------+---------+-----------+----------+--------------+  CFV       Full            Yes       Yes                                     +---------+---------------+---------+-----------+----------+--------------+  SFJ       Full                                                             +---------+---------------+---------+-----------+----------+--------------+  FV Prox   Full                                                             +---------+---------------+---------+-----------+----------+--------------+  FV Mid    Full                                                             +---------+---------------+---------+-----------+----------+--------------+  FV Distal Full                                                             +---------+---------------+---------+-----------+----------+--------------+  PFV       Full                                                             +---------+---------------+---------+-----------+----------+--------------+  POP       Full            Yes       Yes                                    +---------+---------------+---------+-----------+----------+--------------+  PTV       Full                                                             +---------+---------------+---------+-----------+----------+--------------+  PERO      Full                                                             +---------+---------------+---------+-----------+----------+--------------+  Summary: Right: There is no evidence of deep vein thrombosis in the lower extremity. However, portions of this examination were limited- see technologist comments above. No cystic structure found in the popliteal fossa. Left: There is no evidence of deep vein thrombosis in the lower extremity. However, portions of this examination were limited- see technologist comments above. No cystic structure found in the popliteal fossa.  *See table(s) above for measurements and observations. Electronically signed by Gretta Began MD on 09/25/2019 at 6:52:59 AM.    Final     Assessment and Plan  No problem-specific Assessment & Plan notes found for  this encounter.   Labs/tests ordered:    Margit Hanks, MD

## 2019-10-18 NOTE — Progress Notes (Signed)
This encounter was created in error - please disregard.

## 2020-03-23 ENCOUNTER — Other Ambulatory Visit: Payer: Self-pay

## 2020-03-23 ENCOUNTER — Emergency Department (HOSPITAL_COMMUNITY): Payer: Medicare Other

## 2020-03-23 ENCOUNTER — Encounter (HOSPITAL_COMMUNITY): Payer: Self-pay | Admitting: Emergency Medicine

## 2020-03-23 ENCOUNTER — Inpatient Hospital Stay (HOSPITAL_COMMUNITY)
Admission: EM | Admit: 2020-03-23 | Discharge: 2020-04-01 | DRG: 689 | Disposition: A | Discharge status: Hospice | Payer: Medicare Other | Source: Skilled Nursing Facility | Attending: Internal Medicine | Admitting: Internal Medicine

## 2020-03-23 DIAGNOSIS — Z20822 Contact with and (suspected) exposure to covid-19: Secondary | ICD-10-CM | POA: Diagnosis present

## 2020-03-23 DIAGNOSIS — N3000 Acute cystitis without hematuria: Secondary | ICD-10-CM

## 2020-03-23 DIAGNOSIS — G9341 Metabolic encephalopathy: Secondary | ICD-10-CM | POA: Diagnosis present

## 2020-03-23 DIAGNOSIS — W19XXXA Unspecified fall, initial encounter: Secondary | ICD-10-CM | POA: Diagnosis present

## 2020-03-23 DIAGNOSIS — Z881 Allergy status to other antibiotic agents status: Secondary | ICD-10-CM

## 2020-03-23 DIAGNOSIS — N1831 Chronic kidney disease, stage 3a: Secondary | ICD-10-CM | POA: Diagnosis present

## 2020-03-23 DIAGNOSIS — Z66 Do not resuscitate: Secondary | ICD-10-CM | POA: Diagnosis present

## 2020-03-23 DIAGNOSIS — N183 Chronic kidney disease, stage 3 unspecified: Secondary | ICD-10-CM | POA: Diagnosis present

## 2020-03-23 DIAGNOSIS — R4781 Slurred speech: Secondary | ICD-10-CM | POA: Diagnosis present

## 2020-03-23 DIAGNOSIS — E11649 Type 2 diabetes mellitus with hypoglycemia without coma: Secondary | ICD-10-CM | POA: Diagnosis not present

## 2020-03-23 DIAGNOSIS — B965 Pseudomonas (aeruginosa) (mallei) (pseudomallei) as the cause of diseases classified elsewhere: Secondary | ICD-10-CM | POA: Diagnosis present

## 2020-03-23 DIAGNOSIS — B9620 Unspecified Escherichia coli [E. coli] as the cause of diseases classified elsewhere: Secondary | ICD-10-CM | POA: Diagnosis present

## 2020-03-23 DIAGNOSIS — I129 Hypertensive chronic kidney disease with stage 1 through stage 4 chronic kidney disease, or unspecified chronic kidney disease: Secondary | ICD-10-CM | POA: Diagnosis present

## 2020-03-23 DIAGNOSIS — E119 Type 2 diabetes mellitus without complications: Secondary | ICD-10-CM

## 2020-03-23 DIAGNOSIS — G9340 Encephalopathy, unspecified: Secondary | ICD-10-CM | POA: Diagnosis not present

## 2020-03-23 DIAGNOSIS — E785 Hyperlipidemia, unspecified: Secondary | ICD-10-CM | POA: Diagnosis present

## 2020-03-23 DIAGNOSIS — D631 Anemia in chronic kidney disease: Secondary | ICD-10-CM | POA: Diagnosis present

## 2020-03-23 DIAGNOSIS — S0990XA Unspecified injury of head, initial encounter: Secondary | ICD-10-CM | POA: Diagnosis present

## 2020-03-23 DIAGNOSIS — R9082 White matter disease, unspecified: Secondary | ICD-10-CM | POA: Diagnosis present

## 2020-03-23 DIAGNOSIS — Z7401 Bed confinement status: Secondary | ICD-10-CM

## 2020-03-23 DIAGNOSIS — L89152 Pressure ulcer of sacral region, stage 2: Secondary | ICD-10-CM | POA: Diagnosis present

## 2020-03-23 DIAGNOSIS — N390 Urinary tract infection, site not specified: Secondary | ICD-10-CM | POA: Diagnosis present

## 2020-03-23 DIAGNOSIS — R451 Restlessness and agitation: Secondary | ICD-10-CM | POA: Diagnosis present

## 2020-03-23 DIAGNOSIS — B952 Enterococcus as the cause of diseases classified elsewhere: Secondary | ICD-10-CM | POA: Diagnosis present

## 2020-03-23 DIAGNOSIS — Z888 Allergy status to other drugs, medicaments and biological substances status: Secondary | ICD-10-CM

## 2020-03-23 DIAGNOSIS — L89322 Pressure ulcer of left buttock, stage 2: Secondary | ICD-10-CM | POA: Diagnosis not present

## 2020-03-23 DIAGNOSIS — M62838 Other muscle spasm: Secondary | ICD-10-CM | POA: Diagnosis present

## 2020-03-23 DIAGNOSIS — R4182 Altered mental status, unspecified: Secondary | ICD-10-CM

## 2020-03-23 DIAGNOSIS — Z515 Encounter for palliative care: Secondary | ICD-10-CM | POA: Diagnosis not present

## 2020-03-23 DIAGNOSIS — Z8249 Family history of ischemic heart disease and other diseases of the circulatory system: Secondary | ICD-10-CM

## 2020-03-23 DIAGNOSIS — R0602 Shortness of breath: Secondary | ICD-10-CM

## 2020-03-23 DIAGNOSIS — I4891 Unspecified atrial fibrillation: Secondary | ICD-10-CM | POA: Diagnosis present

## 2020-03-23 DIAGNOSIS — E1122 Type 2 diabetes mellitus with diabetic chronic kidney disease: Secondary | ICD-10-CM | POA: Diagnosis present

## 2020-03-23 DIAGNOSIS — G4733 Obstructive sleep apnea (adult) (pediatric): Secondary | ICD-10-CM | POA: Diagnosis present

## 2020-03-23 DIAGNOSIS — Z79899 Other long term (current) drug therapy: Secondary | ICD-10-CM

## 2020-03-23 DIAGNOSIS — J449 Chronic obstructive pulmonary disease, unspecified: Secondary | ICD-10-CM | POA: Diagnosis present

## 2020-03-23 DIAGNOSIS — F0390 Unspecified dementia without behavioral disturbance: Secondary | ICD-10-CM | POA: Diagnosis present

## 2020-03-23 HISTORY — DX: Anemia, unspecified: D64.9

## 2020-03-23 HISTORY — DX: Type 2 diabetes mellitus without complications: E11.9

## 2020-03-23 HISTORY — DX: Sleep apnea, unspecified: G47.30

## 2020-03-23 HISTORY — DX: Hyperlipidemia, unspecified: E78.5

## 2020-03-23 LAB — DIFFERENTIAL
Abs Immature Granulocytes: 0.01 10*3/uL (ref 0.00–0.07)
Basophils Absolute: 0 10*3/uL (ref 0.0–0.1)
Basophils Relative: 1 %
Eosinophils Absolute: 0.3 10*3/uL (ref 0.0–0.5)
Eosinophils Relative: 6 %
Immature Granulocytes: 0 %
Lymphocytes Relative: 39 %
Lymphs Abs: 2.2 10*3/uL (ref 0.7–4.0)
Monocytes Absolute: 0.4 10*3/uL (ref 0.1–1.0)
Monocytes Relative: 7 %
Neutro Abs: 2.6 10*3/uL (ref 1.7–7.7)
Neutrophils Relative %: 47 %

## 2020-03-23 LAB — COMPREHENSIVE METABOLIC PANEL
ALT: 17 U/L (ref 0–44)
AST: 28 U/L (ref 15–41)
Albumin: 3 g/dL — ABNORMAL LOW (ref 3.5–5.0)
Alkaline Phosphatase: 50 U/L (ref 38–126)
Anion gap: 11 (ref 5–15)
BUN: 17 mg/dL (ref 8–23)
CO2: 25 mmol/L (ref 22–32)
Calcium: 9 mg/dL (ref 8.9–10.3)
Chloride: 105 mmol/L (ref 98–111)
Creatinine, Ser: 1.44 mg/dL — ABNORMAL HIGH (ref 0.61–1.24)
GFR calc Af Amer: 50 mL/min — ABNORMAL LOW (ref >60)
GFR calc non Af Amer: 43 mL/min — ABNORMAL LOW (ref >60)
Glucose, Bld: 103 mg/dL — ABNORMAL HIGH (ref 70–99)
Potassium: 3.9 mmol/L (ref 3.5–5.1)
Sodium: 141 mmol/L (ref 135–145)
Total Bilirubin: 0.8 mg/dL (ref 0.3–1.2)
Total Protein: 6.5 g/dL (ref 6.5–8.1)

## 2020-03-23 LAB — URINALYSIS, ROUTINE W REFLEX MICROSCOPIC
Bilirubin Urine: NEGATIVE
Glucose, UA: NEGATIVE mg/dL
Hgb urine dipstick: NEGATIVE
Ketones, ur: NEGATIVE mg/dL
Nitrite: NEGATIVE
Protein, ur: NEGATIVE mg/dL
Specific Gravity, Urine: 1.012 (ref 1.005–1.030)
WBC, UA: >50 WBC/hpf — ABNORMAL HIGH (ref 0–5)
pH: 5 (ref 5.0–8.0)

## 2020-03-23 LAB — PROTIME-INR
INR: 1.1 (ref 0.8–1.2)
Prothrombin Time: 13.9 seconds (ref 11.4–15.2)

## 2020-03-23 LAB — CBC
HCT: 30.2 % — ABNORMAL LOW (ref 39.0–52.0)
Hemoglobin: 9.9 g/dL — ABNORMAL LOW (ref 13.0–17.0)
MCH: 33.4 pg (ref 26.0–34.0)
MCHC: 32.8 g/dL (ref 30.0–36.0)
MCV: 102 fL — ABNORMAL HIGH (ref 80.0–100.0)
Platelets: 267 10*3/uL (ref 150–400)
RBC: 2.96 MIL/uL — ABNORMAL LOW (ref 4.22–5.81)
RDW: 13.6 % (ref 11.5–15.5)
WBC: 5.6 10*3/uL (ref 4.0–10.5)
nRBC: 0 % (ref 0.0–0.2)

## 2020-03-23 LAB — RAPID URINE DRUG SCREEN, HOSP PERFORMED
Amphetamines: NOT DETECTED
Barbiturates: NOT DETECTED
Benzodiazepines: NOT DETECTED
Cocaine: NOT DETECTED
Opiates: NOT DETECTED
Tetrahydrocannabinol: NOT DETECTED

## 2020-03-23 LAB — I-STAT CHEM 8, ED
BUN: 17 mg/dL (ref 8–23)
Calcium, Ion: 1.19 mmol/L (ref 1.15–1.40)
Chloride: 102 mmol/L (ref 98–111)
Creatinine, Ser: 1.3 mg/dL — ABNORMAL HIGH (ref 0.61–1.24)
Glucose, Bld: 99 mg/dL (ref 70–99)
HCT: 30 % — ABNORMAL LOW (ref 39.0–52.0)
Hemoglobin: 10.2 g/dL — ABNORMAL LOW (ref 13.0–17.0)
Potassium: 3.7 mmol/L (ref 3.5–5.1)
Sodium: 141 mmol/L (ref 135–145)
TCO2: 26 mmol/L (ref 22–32)

## 2020-03-23 LAB — AMMONIA: Ammonia: 34 umol/L (ref 9–35)

## 2020-03-23 LAB — SARS CORONAVIRUS 2 BY RT PCR (HOSPITAL ORDER, PERFORMED IN ~~LOC~~ HOSPITAL LAB): SARS Coronavirus 2: NEGATIVE

## 2020-03-23 LAB — APTT: aPTT: 31 seconds (ref 24–36)

## 2020-03-23 LAB — ETHANOL: Alcohol, Ethyl (B): <10 mg/dL (ref <10)

## 2020-03-23 LAB — TROPONIN I (HIGH SENSITIVITY)
Troponin I (High Sensitivity): 52 ng/L — ABNORMAL HIGH (ref <18)
Troponin I (High Sensitivity): 47 ng/L — ABNORMAL HIGH (ref <18)

## 2020-03-23 LAB — TSH: TSH: 0.751 u[IU]/mL (ref 0.350–4.500)

## 2020-03-23 LAB — CBG MONITORING, ED: Glucose-Capillary: 95 mg/dL (ref 70–99)

## 2020-03-23 MED ORDER — SODIUM CHLORIDE 0.9 % IV SOLN
1.0000 g | Freq: Once | INTRAVENOUS | Status: AC
  Administered 2020-03-23: 21:00:00 1 g via INTRAVENOUS
  Filled 2020-03-23: qty 10

## 2020-03-23 MED ORDER — LORAZEPAM 2 MG/ML IJ SOLN
1.0000 mg | Freq: Once | INTRAMUSCULAR | Status: AC
  Administered 2020-03-23: 20:00:00 1 mg via INTRAVENOUS
  Filled 2020-03-23: qty 1

## 2020-03-23 NOTE — Progress Notes (Signed)
Patient came down for his MRI but was very agitated and was fighting [place] when trying to get him into a different gown. His nurse was called and she gave his IV Ativan. Also stated that patient does have sundowns. Patient started to snore, so we put him on our MRI table, but patient would not lay flat. I tried propping him up as much as I could and even attempted to use the flex coil for imaging but patient continued to raise his head up and knocking the coil off. MD was contacted and said to bring him back to the ED because she plans on admitting him and the imaging can be tried again at a later time.

## 2020-03-23 NOTE — Progress Notes (Signed)
EEG complete - results pending 

## 2020-03-23 NOTE — ED Notes (Signed)
Pt having periods of apneic spells after returning from MRI. Hx of OSA noted in chart. Does not wear CPAP at facility. Placed on 2L [place]

## 2020-03-23 NOTE — Code Documentation (Signed)
Patient from [facility] where he was LKW at 1330. He had a fall and staff responded. Family arrived to visit him when they noticed he was very lethargic and hard to arouse. According to staff his pulse was faint and BP was 198/72. They called [facility] who arrived and found patient with AMS and weakness. They called a code stroke. Pt arrived to [facility] and was taken to CT with the EDP, Stroke team, and RN. NIHSS 6 at this time for weakness, right gaze preference and AMS. See documentation. No TPA given d/t symptoms mild, improving per MD. Patient's MRS 4-5 per MD therefore no IR considered. Family is now at the bedside. Care Plan: MRI, EEG, q15 vitals, q30 neuro checks until outside of TPA window at 1800. Hand off given with RN Maralyn Sago.

## 2020-03-23 NOTE — ED Triage Notes (Signed)
Pt coming in from [facility] as code STROKE. LKN 1330. Staff walked in at facility in order to get pt dressed to see family, and found him in the floor. Unwitnessed fall. Staff got up patient and then got him dressed. When family arrived to see pt, they noted around 1415 that his speech was slurred and he was not answering their questions appropriately. Pt slow to respond with EMS. R side weakness and decreased grip. Hx of a-fib. BP systolic in 160s. Vomited with EMS. No blood thinners

## 2020-03-23 NOTE — ED Notes (Signed)
Walked to MRI to [employer] Ativan due to patient becoming agitated when staff tried to change his gown. Staff attempted to rescan the patient but he became combative when the technician needed to position his head and yelled for them to stop touching him. PA informed and told to transport pt back to room. Technician notified

## 2020-03-23 NOTE — Consult Note (Addendum)
Neurology Consultation  Reason for Consult: Code stroke Referring Physician: Terald Sleeper, MD  CC: AMS  History is obtained from: EMS/ Wife  HPI: Jason Vasquez is a 86 y.o. male with history of sleep apnea, hypertension, hyperlipidemia, diabetes and dementia.  Patient resides at [facility].  Today earlier in the day patient did have a fall and hit his head.  There did not seem to be any abnormal neurological symptoms after the fall.  Later he was in the family area with family when he suddenly became unresponsive and was laying on the floor.  At that time CODE BLUE was called by the facility.  He slowly came around and EMS was called.  Nurse at [facility] states his blood pressure at that time was 198/72, he showed no seizure activity-heavy breathing, foaming at the mouth, jerking.  On arrival EMS noted patient was very lethargic, has slurred speech and was leaning to the right.  In route patient did vomit.  He was noticed to be in atrial fibrillation in route.  He is not on any anticoagulations.  On arrival patient was lethargic, slow to respond.  He was brought immediately to CT to evaluate for possible subdural hematoma and/or stroke.  CT did not show any intracranial abnormalities.  When asked if he knew why he was at the hospital he said "I think I blanked out "and "I fell but I do not know what caused me to fall ".  ED course  Relevant labs include -creatinine of 1.44.  CT head shows-No acute intracranial abnormality or significant interval change. Remote lacunar infarcts of the thalami bilaterally.Advanced atrophy and diffuse white matter disease. This likely reflects the sequela of chronic microvascular ischemia.  LKW: 13:30 tpa given?: no, To good to treat. Premorbid modified Rankin scale (mRS): 4 NIH stroke score 6   Past Medical History:  Diagnosis Date  . Anemia   . CKD (chronic kidney disease)   . COPD (chronic obstructive pulmonary disease) (HCC)   . Dementia (HCC)   .  Diabetes mellitus without complication (HCC)   . Hyperlipemia   . Hypertension   . Sleep apnea     Family History  Problem Relation Age of Onset  . Hypertension Mother   . Hypertension Father    Social History:   reports that he has never smoked. He has never used smokeless tobacco. He reports that he does not drink alcohol or use drugs.  Medications No current facility-administered medications for this encounter.  Current Outpatient Medications:  .  acetaminophen (TYLENOL) 650 MG CR tablet, Take 650 mg by mouth every 8 (eight) hours as needed for pain. , Disp: , Rfl:  .  chlorthalidone (HYGROTON) 50 MG tablet, Take 50 mg by mouth daily., Disp: , Rfl:  .  Cholecalciferol (VITAMIN D3) 1.25 MG (50000 UT) CAPS, Take 1.25 mg by mouth every 30 (thirty) days., Disp: , Rfl:  .  citalopram (CELEXA) 10 MG tablet, Take 10 mg by mouth daily., Disp: , Rfl:  .  diltiazem (CARDIZEM LA) 420 MG 24 hr tablet, Take 420 mg by mouth daily., Disp: , Rfl:  .  enoxaparin (LOVENOX) 30 MG/0.3ML injection, Inject 0.3 mLs (30 mg total) into the skin every 12 (twelve) hours., Disp: 18 mL, Rfl: 0 .  famotidine (PEPCID) 10 MG tablet, Take 10 mg by mouth 2 (two) times daily., Disp: , Rfl:  .  finasteride (PROSCAR) 5 MG tablet, Take 5 mg by mouth at bedtime. , Disp: , Rfl:  .  fluticasone (FLONASE) 50 MCG/ACT nasal spray, Place 1 spray into both nostrils daily., Disp: , Rfl:  .  Menthol, Topical Analgesic, (BIOFREEZE EX), Apply 1 application topically 4 (four) times daily. Each knee, Disp: , Rfl:  .  methocarbamol (ROBAXIN) 500 MG tablet, Take 1 tablet (500 mg total) by mouth every 6 (six) hours as needed for muscle spasms., Disp: 40 tablet, Rfl: 0 .  telmisartan (MICARDIS) 80 MG tablet, Take 80 mg by mouth daily., Disp: , Rfl:  .  tolnaftate (TINACTIN) 1 % powder, Apply 1 application topically 2 (two) times daily. feet, Disp: , Rfl:  .  traZODone (DESYREL) 50 MG tablet, Take 25 mg by mouth at bedtime., Disp: , Rfl:    ROS:    General ROS: negative for - chills, fatigue, fever, night sweats, weight gain or weight loss Psychological ROS: negative for - behavioral disorder, hallucinations, memory difficulties, mood swings or suicidal ideation Ophthalmic ROS: negative for - blurry vision, double vision, eye pain or loss of vision ENT ROS: negative for - epistaxis, nasal discharge, oral lesions, sore throat, tinnitus or vertigo Respiratory ROS: negative for - cough, hemoptysis, shortness of breath or wheezing Cardiovascular ROS: negative for - chest pain, dyspnea on exertion, edema or irregular heartbeat Gastrointestinal ROS: Positive for -nausea/vomiting  Genito-Urinary ROS: negative for - dysuria, hematuria, incontinence or urinary frequency/urgency Musculoskeletal ROS: negative for - joint swelling or muscular weakness Neurological ROS: as noted in HPI Dermatological ROS: negative for rash and skin lesion changes  Exam: Current vital signs: BP (!) 165/75   Pulse 92   Resp 19   Ht 6' (1.829 m)   Wt 84.4 kg   SpO2 93%   BMI 25.23 kg/m  Vital signs in last 24 hours: Pulse Rate:  [80-93] 92 (05/11 1540) Resp:  [19-28] 19 (05/11 1540) BP: (165)/(75) 165/75 (05/11 1530) SpO2:  [93 %-97 %] 93 % (05/11 1540) Weight:  [84.4 kg] 84.4 kg (05/11 1537)   Constitutional: Appears well-developed and well-nourished.  Eyes: No scleral injection HENT: No OP obstrucion Head: Normocephalic.  Cardiovascular: Irregular irregular Respiratory: Effort normal, non-labored breathing GI: Soft.  No distension. There is no tenderness.  Skin: WDI  Neuro: Mental Status: Patient is drowsy, not clear why he is at the hospital, slow to follow commands.  Does not appear to have any aphasia or dysarthria. Cranial Nerves: II: Difficulty with left temporal field, and right gaze preference.   III,IV, VI: EOMI without ptosis or diploplia. Pupils equal, round and reactive to light. V: Facial sensation is symmetric to  temperature VII: Facial movement is symmetric.  VIII: hearing is intact to voice X: Palat elevates symmetrically XI: Shoulder shrug is symmetric. XII: tongue is midline without atrophy or fasciculations.  Motor: Moving all extremities antigravity Sensory: Sensation is symmetric to light touch and temperature in the arms and legs. DSS intact Deep Tendon Reflexes: 2+ and symmetric in the biceps and patellae.  Plantars: Toes are downgoing bilaterally.  Cerebellar: FNF intact Labs I have reviewed labs in epic and the results pertinent to this consultation are:   CBC    Component Value Date/Time   WBC 5.6 03/23/2020 1515   RBC 2.96 (L) 03/23/2020 1515   HGB 10.2 (L) 03/23/2020 1520   HCT 30.0 (L) 03/23/2020 1520   PLT 267 03/23/2020 1515   MCV 102.0 (H) 03/23/2020 1515   MCH 33.4 03/23/2020 1515   MCHC 32.8 03/23/2020 1515   RDW 13.6 03/23/2020 1515   LYMPHSABS 2.2 03/23/2020 1515  MONOABS 0.4 03/23/2020 1515   EOSABS 0.3 03/23/2020 1515   BASOSABS 0.0 03/23/2020 1515    CMP     Component Value Date/Time   NA 141 03/23/2020 1520   K 3.7 03/23/2020 1520   CL 102 03/23/2020 1520   CO2 25 03/23/2020 1515   GLUCOSE 99 03/23/2020 1520   BUN 17 03/23/2020 1520   CREATININE 1.30 (H) 03/23/2020 1520   CALCIUM 9.0 03/23/2020 1515   PROT 6.5 03/23/2020 1515   ALBUMIN 3.0 (L) 03/23/2020 1515   AST 28 03/23/2020 1515   ALT 17 03/23/2020 1515   ALKPHOS 50 03/23/2020 1515   BILITOT 0.8 03/23/2020 1515   GFRNONAA 43 (L) 03/23/2020 1515   GFRAA 50 (L) 03/23/2020 1515     Imaging I have reviewed the images obtained:  CT-scan of the brain-no acute intracranial abnormality or significant interval change.  Advanced atrophy and diffuse white matter disease.  This likely reflects the sequela of chronic microvascular ischemia.  Remote lacunar infarcts of the thalami bilaterally.  MRI examination of the brain-pending  Felicie Morn PA-C [facility] [phone]  M-F  (9:00 am- 5:00 PM)  03/23/2020, 4:13 PM   I have seen the patient reviewed the above note.  Per his daughters, he fell asleep and was essentially unarousable.  EMS activated code stroke.  Assessment:  86 year old male presenting to the hospital as code stroke secondary to altered mental status and right-sided weakness.  CT head negative for stroke.  tPA not given secondary to too good to treat.  At this time differential does include possible stroke/shower of emboli as he has atrial fibrillation and is not on anticoagulation versus syncope versus lethargy for other cause.  Impression: -Lethargic -Encephalopathic  Recommend -MRI of the brain without contrast -MRA Head and neck  -EEG -UA, ammonia, TSH  Ritta Slot, MD [facility] [phone]  If 7pm- 7am, please page neurology on call as listed in AMION.

## 2020-03-23 NOTE — ED Notes (Signed)
Condom cath placed on pt.  Pt resting with eyes closed at this time.

## 2020-03-23 NOTE — ED Provider Notes (Signed)
[facility] EMERGENCY DEPARTMENT Provider Note   CSN: [id number] Arrival date & time: 03/23/20  1511   [facility] by Attending Dr. Renaye Rakersrifan. Airway clear.  An emergency department physician performed an initial assessment on this suspected stroke patient at 151512. History Code Stroke   Jason Vasquez is a 86 y.o. male with past medical history significant for CKD, COPD, dementia, diabetes who presents for evaluation as a code stroke.   Found on floor by living facility at approximately 130.  Facility placed back in bed.  Family came to visit him approximately 1 hour later and noted he had slurred speech and was not answering questions appropriately.  Slow to respond with EMS.  Right-sided weakness and decreased grip strength per EMS.  Episode of NBNB emesis with EMS.  No anticoagulation, unwitnessed fall.  Level 5 caveat-AMS  HPI     Past Medical History:  Diagnosis Date  . Anemia   . CKD (chronic kidney disease)   . COPD (chronic obstructive pulmonary disease) (HCC)   . Dementia (HCC)   . Diabetes mellitus without complication (HCC)   . Hyperlipemia   . Hypertension   . Sleep apnea     Patient Active Problem List   Diagnosis Date Noted  . COVID-19 virus infection 09/29/2019  . Delirium 09/28/2019  . CKD (chronic kidney disease), stage III 09/27/2019  . HLD (hyperlipidemia) 09/27/2019  . DM2 (diabetes mellitus, type 2) (HCC) 09/27/2019  . BPH (benign prostatic hyperplasia) 09/27/2019  . Pressure injury of skin 09/27/2019  . Quadriceps muscle rupture, right, initial encounter 09/24/2019  . Quadriceps tendon rupture, right, initial encounter 09/23/2019    Past Surgical History:  Procedure Laterality Date  . QUADRICEPS TENDON REPAIR Right 09/24/2019   Procedure: REPAIR RIGHT QUADRICEP TENDON;  Surgeon: Beverely LowNorris, Steve, MD;  Location: [facility] ORS;  Service: Orthopedics;  Laterality: Right;       Family History  Problem Relation Age of Onset  .  Hypertension Mother   . Hypertension Father     Social History   Tobacco Use  . Smoking status: Never Smoker  . Smokeless tobacco: Never Used  Substance Use Topics  . Alcohol use: Never  . Drug use: Never    Home Medications Prior to Admission medications   Medication Sig Start Date End Date Taking? Authorizing Provider  acetaminophen (TYLENOL) 650 MG CR tablet Take 650 mg by mouth every 8 (eight) hours as needed for pain.     [provider]  chlorthalidone (HYGROTON) 50 MG tablet Take 50 mg by mouth daily.    [provider]  Cholecalciferol (VITAMIN D3) 1.25 MG (50000 UT) CAPS Take 1.25 mg by mouth every 30 (thirty) days.    [provider]  citalopram (CELEXA) 10 MG tablet Take 10 mg by mouth daily.    [provider]  diltiazem (CARDIZEM LA) 420 MG 24 hr tablet Take 420 mg by mouth daily. 05/17/17   [provider]  enoxaparin (LOVENOX) 30 MG/0.3ML injection Inject 0.3 mLs (30 mg total) into the skin every 12 (twelve) hours. 09/26/19 10/26/19  Beverely LowNorris, Steve, MD  famotidine (PEPCID) 10 MG tablet Take 10 mg by mouth 2 (two) times daily.    [provider]  finasteride (PROSCAR) 5 MG tablet Take 5 mg by mouth at bedtime.  12/01/15   [provider]  fluticasone (FLONASE) 50 MCG/ACT nasal spray Place 1 spray into both nostrils daily.    [provider]  Menthol, Topical Analgesic, (BIOFREEZE  EX) Apply 1 application topically 4 (four) times daily. Each knee    [provider]  methocarbamol (ROBAXIN) 500 MG tablet Take 1 tablet (500 mg total) by mouth every 6 (six) hours as needed for muscle spasms. 09/26/19   Netta Cedars, MD  telmisartan (MICARDIS) 80 MG tablet Take 80 mg by mouth daily.    [provider]  tolnaftate (TINACTIN) 1 % powder Apply 1 application topically 2 (two) times daily. feet    [provider]  traZODone (DESYREL) 50 MG tablet Take 25 mg by mouth at bedtime.     [provider]    Allergies    Ace inhibitors, Ciprofloxacin, Levofloxacin, Metformin, Neomy-bacit-polymyx-pramoxine, and Tramadol  Review of Systems   Review of Systems  Unable to perform ROS: Mental status change    Physical Exam Updated Vital Signs BP (!) 158/68   Pulse 84   Resp 14   Ht 6' (1.829 m)   Wt 84.4 kg   SpO2 99%   BMI 25.23 kg/m   Physical Exam Vitals and nursing note reviewed.  Constitutional:      General: He is not in acute distress.    Appearance: He is well-developed. He is not toxic-appearing or diaphoretic.  HENT:     Head: Normocephalic and atraumatic.     Nose: Nose normal.     Mouth/Throat:     Pharynx: Oropharynx is clear.  Eyes:     Pupils: Pupils are equal, round, and reactive to light.  Cardiovascular:     Rate and Rhythm: Normal rate and regular rhythm.     Pulses: Normal pulses.     Heart sounds: Normal heart sounds.  Pulmonary:     Effort: Pulmonary effort is normal. No respiratory distress.     Breath sounds: Normal breath sounds.  Abdominal:     General: Bowel sounds are normal. There is no distension.     Palpations: Abdomen is soft.     Tenderness: There is no abdominal tenderness. There is no guarding or rebound.  Musculoskeletal:        General: No swelling, tenderness, deformity or signs of injury. Normal range of motion.     Cervical back: Normal range of motion and neck supple.     Comments: Moves 4 extremities without difficulty  Skin:    General: Skin is warm and dry.     Capillary Refill: Capillary refill takes less than 2 seconds.  Neurological:     Mental Status: He is alert.     Comments: Patient sleepy, slow to follow intermittent commands.. Aphasia. Unsure why he is here. States "I went out Preference to right gaze. No ptosis or diplopia PERRLA. Symmetric facial sensation. Tongue midline. Equal shoulder shrug. Mildly decreased right hand grip. Unable to perform finger-to-nose     ED Results /  Procedures / Treatments   Labs (all labs ordered are listed, but only abnormal results are displayed) Labs Reviewed  CBC - Abnormal; Notable for the following components:      Result Value   RBC 2.96 (*)    Hemoglobin 9.9 (*)    HCT 30.2 (*)    MCV 102.0 (*)    All other components within normal limits  COMPREHENSIVE METABOLIC PANEL - Abnormal; Notable for the following components:   Glucose, Bld 103 (*)    Creatinine, Ser 1.44 (*)    Albumin 3.0 (*)    GFR calc non Af Amer 43 (*)    GFR calc Af Amer 50 (*)  All other components within normal limits  URINALYSIS, ROUTINE W REFLEX MICROSCOPIC - Abnormal; Notable for the following components:   Leukocytes,Ua MODERATE (*)    WBC, UA >50 (*)    Bacteria, UA FEW (*)    All other components within normal limits  I-STAT CHEM 8, ED - Abnormal; Notable for the following components:   Creatinine, Ser 1.30 (*)    Hemoglobin 10.2 (*)    HCT 30.0 (*)    All other components within normal limits  TROPONIN I (HIGH SENSITIVITY) - Abnormal; Notable for the following components:   Troponin I (High Sensitivity) 52 (*)    All other components within normal limits  URINE CULTURE  SARS CORONAVIRUS 2 BY RT PCR (HOSPITAL ORDER, PERFORMED IN [facility])  ETHANOL  PROTIME-INR  APTT  DIFFERENTIAL  RAPID URINE DRUG SCREEN, HOSP PERFORMED  AMMONIA  TSH  CBG MONITORING, ED  TROPONIN I (HIGH SENSITIVITY)    EKG EKG Interpretation  Date/Time:  Tuesday Mar 23 2020 15:37:39 EDT Ventricular Rate:  89 PR Interval:    QRS Duration: 73 QT Interval:  386 QTC Calculation: 470 R Axis:   13 Text Interpretation: Unknown rhythm, irregular rate Low voltage, precordial leads Borderline T wave abnormalities No STEMI Confirmed by Alvester Chou [phone]) on 03/23/2020 4:33:17 PM   Radiology DG Pelvis Portable  Result Date: 03/23/2020 CLINICAL DATA:  Fall EXAM: PORTABLE PELVIS 1-2 VIEWS COMPARISON:  None. FINDINGS: No acute bony abnormality.  Specifically, no fracture, subluxation, or dislocation. Hip joints are symmetric. IMPRESSION: No acute bony abnormality. Electronically Signed   By: Charlett Nose M.D.   On: 03/23/2020 16:54   DG Chest Portable 1 View  Result Date: 03/23/2020 CLINICAL DATA:  Fall EXAM: PORTABLE CHEST 1 VIEW COMPARISON:  None. FINDINGS: Mild cardiomegaly. Interstitial prominence within the lungs could reflect interstitial edema. This is improved since prior study. No effusions or pneumothorax. No acute bony abnormality. IMPRESSION: Diffuse interstitial prominence, likely interstitial edema, improved since prior study. Electronically Signed   By: Charlett Nose M.D.   On: 03/23/2020 16:53   DG Shoulder Left Portable  Result Date: 03/23/2020 CLINICAL DATA:  Left shoulder pain. EXAM: LEFT SHOULDER COMPARISON:  None. FINDINGS: There is no evidence of fracture or dislocation. Mild to moderate severity degenerative changes seen involving the left acromioclavicular joint and left acromion. Soft tissues are unremarkable. IMPRESSION: Mild to moderate severity degenerative changes without evidence of acute fracture or dislocation. Electronically Signed   By: Aram Candela M.D.   On: 03/23/2020 21:41   CT HEAD CODE STROKE WO CONTRAST  Result Date: 03/23/2020 CLINICAL DATA:  Code stroke. Ataxia. Right-sided weakness. Altered mental status. EXAM: CT HEAD WITHOUT CONTRAST TECHNIQUE: Contiguous axial images were obtained from the base of the skull through the vertex without intravenous contrast. COMPARISON:  CT head 09/15/2019 at [facility]. FINDINGS: Brain: Advanced atrophy and diffuse white matter disease is present. There are new cortical infarct is present. Basal ganglia are stable. Insular cortex is normal. Remote lacunar infarcts are present in the thalami bilaterally. Chronic white matter changes extend into the brainstem. Cerebellum is normal. Vascular: No hyperdense vessel or unexpected  calcification. Skull: Calvarium is intact. No focal lytic or blastic lesions are present. No significant extracranial soft tissue lesion is present. Sinuses/Orbits: The paranasal sinuses and mastoid air cells are clear. The globes and orbits are within normal limits. ASPECTS [facility] Stroke Program Early CT Score) - Ganglionic level infarction (caudate, lentiform nuclei, internal  capsule, insula, M1-M3 cortex): 7/7 - Supraganglionic infarction (M4-M6 cortex): 3/3 Total score (0-10 with 10 being normal): 10/10 IMPRESSION: 1. No acute intracranial abnormality or significant interval change. 2. Advanced atrophy and diffuse white matter disease. This likely reflects the sequela of chronic microvascular ischemia. 3. Remote lacunar infarcts of the thalami bilaterally. *ASPECTS is 10/10 * The above was relayed via text pager to Dr. Amada Jupiter on 03/23/2020 at 15:37 . Electronically Signed   By: Marin Roberts M.D.   On: 03/23/2020 15:37    Procedures .Critical Care Performed by: Linwood Dibbles, PA-C Authorized by: Linwood Dibbles, PA-C   Critical care provider statement:    Critical care time (minutes):  45   Critical care was necessary to treat or prevent imminent or life-threatening deterioration of the following conditions:  CNS failure or compromise and metabolic crisis   Critical care was time spent personally by me on the following activities:  Discussions with consultants, evaluation of patient's response to treatment, examination of patient, ordering and performing treatments and interventions, ordering and review of laboratory studies, ordering and review of radiographic studies, pulse oximetry, re-evaluation of patient's condition, obtaining history from patient or surrogate and review of old charts   (including critical care time)  Medications Ordered in ED Medications  LORazepam (ATIVAN) injection 1 mg (1 mg Intravenous Given 03/23/20 1945)  cefTRIAXone (ROCEPHIN) 1 g in sodium  chloride 0.9 % 100 mL IVPB (0 g Intravenous Stopped 03/23/20 2147)   ED Course  I have reviewed the triage vital signs and the nursing notes.  Pertinent labs & imaging results that were available during my care of the patient were reviewed by me and considered in my medical decision making (see chart for details).  86 year old presents for evaluation under code stroke. LKN 1330.  Patient seen at [facility] by attending, Dr. Renaye Rakers, cleared airway.  Patient immediately to ED scanner with neurology, Dr. Amada Jupiter. No acute infarct per neurology did not recommend TPA. Plan on MRIs, EEG. Patient does seem encephalopathic. Unsure etiology. Possible syncopal event.  Labs and imaging personally viewed and interpreted CT head without acute infarct Metabolic panel with creatinine 1.44, albumin 3.0 Ammonia 34 Trop 52 TSH 0.751 INR 1.1 CBC without leukocytosis, hemoglobin 9.9, patient swatting at provider unable to collect occult. Will defer at this time. UA positive for infection, will treat and culture UDS negative COVID pending DG pelvic without acute abnormality DG chest with diffuse interstitial prominence however improved from previous  Patient reassessed. He continues to be confused.  Family does note he gets this way with urinary tract infections however this was more sudden onset today.  Patient reassessed. Arousable to voice however continues to be somnolent.  He is protecting his airway.  Plan on MRI.  Patient unfortunately became combative at MRI.  He did require Ativan however continue to be combative but intermittently would fall back asleep and starts snoring.  He was brought back to his room for reevaluation.  Consult Dr. Renaye Rakers states to hold for a little bit of time in room and reassess. Dr. Renaye Rakers in to assess patient.  Patient reassessed.  Has intermittent apnea consistent with his prior history of OSA on his records.  He was placed on supplemental oxygen due to intermittent low  oxygen saturations.  Will reattempt MRI once patient more calm.  CONSULT with Dr. Debby Bud with [facility] who will evaluate patient for admission.  Discussed with daughter in room.  Agreeable with admission.  She did state that  her father was complaining of left shoulder pain.  Will add plain film x-ray.  Hemodynamically stable.  Plain film left shoulder without acute findings. Patient now denies any pain however quickly falls asleep.  The patient appears reasonably stabilized for admission considering the current resources, flow, and capabilities available in the ED at this time, and I doubt any other [facility] requiring further screening and/or treatment in the ED prior to admission.  She is seen evaluate attending, Dr. Renaye Rakers who agrees with the treatment, plan and disposition    MDM Rules/Calculators/A&P                       Final Clinical Impression(s) / ED Diagnoses Final diagnoses:  Altered mental status, unspecified altered mental status type  Acute cystitis without hematuria    Rx / DC Orders ED Discharge Orders    None       Nyxon Strupp A, PA-C 03/23/20 2213    Terald Sleeper, MD 03/24/20 0040

## 2020-03-24 ENCOUNTER — Inpatient Hospital Stay (HOSPITAL_COMMUNITY): Payer: Medicare Other

## 2020-03-24 DIAGNOSIS — Z20822 Contact with and (suspected) exposure to covid-19: Secondary | ICD-10-CM | POA: Diagnosis present

## 2020-03-24 DIAGNOSIS — M62838 Other muscle spasm: Secondary | ICD-10-CM | POA: Diagnosis present

## 2020-03-24 DIAGNOSIS — N390 Urinary tract infection, site not specified: Secondary | ICD-10-CM | POA: Diagnosis present

## 2020-03-24 DIAGNOSIS — G9340 Encephalopathy, unspecified: Secondary | ICD-10-CM | POA: Diagnosis present

## 2020-03-24 DIAGNOSIS — N1831 Chronic kidney disease, stage 3a: Secondary | ICD-10-CM | POA: Diagnosis present

## 2020-03-24 DIAGNOSIS — E785 Hyperlipidemia, unspecified: Secondary | ICD-10-CM

## 2020-03-24 DIAGNOSIS — Z515 Encounter for palliative care: Secondary | ICD-10-CM | POA: Diagnosis not present

## 2020-03-24 DIAGNOSIS — G9341 Metabolic encephalopathy: Secondary | ICD-10-CM | POA: Diagnosis present

## 2020-03-24 DIAGNOSIS — S0990XA Unspecified injury of head, initial encounter: Secondary | ICD-10-CM | POA: Diagnosis present

## 2020-03-24 DIAGNOSIS — E119 Type 2 diabetes mellitus without complications: Secondary | ICD-10-CM | POA: Diagnosis not present

## 2020-03-24 DIAGNOSIS — N1832 Chronic kidney disease, stage 3b: Secondary | ICD-10-CM | POA: Diagnosis not present

## 2020-03-24 DIAGNOSIS — D631 Anemia in chronic kidney disease: Secondary | ICD-10-CM | POA: Diagnosis present

## 2020-03-24 DIAGNOSIS — B952 Enterococcus as the cause of diseases classified elsewhere: Secondary | ICD-10-CM | POA: Diagnosis present

## 2020-03-24 DIAGNOSIS — E11649 Type 2 diabetes mellitus with hypoglycemia without coma: Secondary | ICD-10-CM | POA: Diagnosis not present

## 2020-03-24 DIAGNOSIS — E1122 Type 2 diabetes mellitus with diabetic chronic kidney disease: Secondary | ICD-10-CM | POA: Diagnosis present

## 2020-03-24 DIAGNOSIS — G4733 Obstructive sleep apnea (adult) (pediatric): Secondary | ICD-10-CM | POA: Diagnosis present

## 2020-03-24 DIAGNOSIS — F0390 Unspecified dementia without behavioral disturbance: Secondary | ICD-10-CM | POA: Diagnosis present

## 2020-03-24 DIAGNOSIS — N3000 Acute cystitis without hematuria: Secondary | ICD-10-CM | POA: Diagnosis not present

## 2020-03-24 DIAGNOSIS — I4891 Unspecified atrial fibrillation: Secondary | ICD-10-CM | POA: Diagnosis present

## 2020-03-24 DIAGNOSIS — J449 Chronic obstructive pulmonary disease, unspecified: Secondary | ICD-10-CM | POA: Diagnosis present

## 2020-03-24 DIAGNOSIS — W19XXXA Unspecified fall, initial encounter: Secondary | ICD-10-CM | POA: Diagnosis present

## 2020-03-24 DIAGNOSIS — Z888 Allergy status to other drugs, medicaments and biological substances status: Secondary | ICD-10-CM | POA: Diagnosis not present

## 2020-03-24 DIAGNOSIS — I129 Hypertensive chronic kidney disease with stage 1 through stage 4 chronic kidney disease, or unspecified chronic kidney disease: Secondary | ICD-10-CM | POA: Diagnosis present

## 2020-03-24 DIAGNOSIS — Z66 Do not resuscitate: Secondary | ICD-10-CM | POA: Diagnosis present

## 2020-03-24 DIAGNOSIS — Z8249 Family history of ischemic heart disease and other diseases of the circulatory system: Secondary | ICD-10-CM | POA: Diagnosis not present

## 2020-03-24 DIAGNOSIS — B9620 Unspecified Escherichia coli [E. coli] as the cause of diseases classified elsewhere: Secondary | ICD-10-CM | POA: Diagnosis present

## 2020-03-24 DIAGNOSIS — R4781 Slurred speech: Secondary | ICD-10-CM | POA: Diagnosis present

## 2020-03-24 DIAGNOSIS — R4182 Altered mental status, unspecified: Secondary | ICD-10-CM | POA: Diagnosis not present

## 2020-03-24 DIAGNOSIS — Z79899 Other long term (current) drug therapy: Secondary | ICD-10-CM | POA: Diagnosis not present

## 2020-03-24 DIAGNOSIS — Z881 Allergy status to other antibiotic agents status: Secondary | ICD-10-CM | POA: Diagnosis not present

## 2020-03-24 LAB — CBC WITH DIFFERENTIAL/PLATELET
Abs Immature Granulocytes: 0.02 10*3/uL (ref 0.00–0.07)
Basophils Absolute: 0 10*3/uL (ref 0.0–0.1)
Basophils Relative: 1 %
Eosinophils Absolute: 0.4 10*3/uL (ref 0.0–0.5)
Eosinophils Relative: 9 %
HCT: 29 % — ABNORMAL LOW (ref 39.0–52.0)
Hemoglobin: 9 g/dL — ABNORMAL LOW (ref 13.0–17.0)
Immature Granulocytes: 1 %
Lymphocytes Relative: 33 %
Lymphs Abs: 1.4 10*3/uL (ref 0.7–4.0)
MCH: 33.5 pg (ref 26.0–34.0)
MCHC: 31 g/dL (ref 30.0–36.0)
MCV: 107.8 fL — ABNORMAL HIGH (ref 80.0–100.0)
Monocytes Absolute: 0.4 10*3/uL (ref 0.1–1.0)
Monocytes Relative: 9 %
Neutro Abs: 2.1 10*3/uL (ref 1.7–7.7)
Neutrophils Relative %: 47 %
Platelets: 249 10*3/uL (ref 150–400)
RBC: 2.69 MIL/uL — ABNORMAL LOW (ref 4.22–5.81)
RDW: 13.9 % (ref 11.5–15.5)
WBC: 4.2 10*3/uL (ref 4.0–10.5)
nRBC: 0 % (ref 0.0–0.2)

## 2020-03-24 LAB — BASIC METABOLIC PANEL
Anion gap: 9 (ref 5–15)
BUN: 15 mg/dL (ref 8–23)
CO2: 26 mmol/L (ref 22–32)
Calcium: 9.1 mg/dL (ref 8.9–10.3)
Chloride: 106 mmol/L (ref 98–111)
Creatinine, Ser: 1.44 mg/dL — ABNORMAL HIGH (ref 0.61–1.24)
GFR calc Af Amer: 50 mL/min — ABNORMAL LOW (ref >60)
GFR calc non Af Amer: 43 mL/min — ABNORMAL LOW (ref >60)
Glucose, Bld: 78 mg/dL (ref 70–99)
Potassium: 4 mmol/L (ref 3.5–5.1)
Sodium: 141 mmol/L (ref 135–145)

## 2020-03-24 LAB — GLUCOSE, CAPILLARY
Glucose-Capillary: 128 mg/dL — ABNORMAL HIGH (ref 70–99)
Glucose-Capillary: 101 mg/dL — ABNORMAL HIGH (ref 70–99)

## 2020-03-24 LAB — CBG MONITORING, ED
Glucose-Capillary: 72 mg/dL (ref 70–99)
Glucose-Capillary: 67 mg/dL — ABNORMAL LOW (ref 70–99)

## 2020-03-24 LAB — HEMOGLOBIN A1C
Hgb A1c MFr Bld: 5.1 % (ref 4.8–5.6)
Mean Plasma Glucose: 99.67 mg/dL

## 2020-03-24 MED ORDER — IRBESARTAN 300 MG PO TABS
300.0000 mg | ORAL_TABLET | Freq: Every day | ORAL | Status: DC
  Administered 2020-03-24 – 2020-03-28 (×5): 300 mg via ORAL
  Filled 2020-03-24 (×6): qty 1

## 2020-03-24 MED ORDER — LABETALOL HCL 5 MG/ML IV SOLN: 10.0000 mg | INTRAVENOUS | Status: DC | PRN

## 2020-03-24 MED ORDER — SODIUM CHLORIDE 0.9% FLUSH: 3.0000 mL | INTRAVENOUS | Status: DC | PRN

## 2020-03-24 MED ORDER — TRAZODONE HCL 50 MG PO TABS
25.0000 mg | ORAL_TABLET | Freq: Every day | ORAL | Status: DC
  Administered 2020-03-24 – 2020-03-26 (×3): 25 mg via ORAL
  Filled 2020-03-24 (×5): qty 1

## 2020-03-24 MED ORDER — FAMOTIDINE 20 MG PO TABS
10.0000 mg | ORAL_TABLET | Freq: Two times a day (BID) | ORAL | Status: DC
  Administered 2020-03-24 – 2020-03-28 (×8): 10 mg via ORAL
  Filled 2020-03-24 (×11): qty 1

## 2020-03-24 MED ORDER — ACETAMINOPHEN 325 MG PO TABS
650.0000 mg | ORAL_TABLET | Freq: Three times a day (TID) | ORAL | Status: DC | PRN
  Administered 2020-03-25 (×2): 650 mg via ORAL
  Filled 2020-03-24 (×2): qty 2

## 2020-03-24 MED ORDER — SODIUM CHLORIDE 0.9 % IV SOLN: 1.0000 g | INTRAVENOUS | Status: DC

## 2020-03-24 MED ORDER — HYDROCORTISONE 1 % EX CREA
TOPICAL_CREAM | Freq: Three times a day (TID) | CUTANEOUS | Status: DC | PRN
  Filled 2020-03-24 (×2): qty 28

## 2020-03-24 MED ORDER — DOCUSATE SODIUM 100 MG PO CAPS
100.0000 mg | ORAL_CAPSULE | Freq: Two times a day (BID) | ORAL | Status: DC
  Administered 2020-03-24 – 2020-03-26 (×6): 100 mg via ORAL
  Filled 2020-03-24 (×10): qty 1

## 2020-03-24 MED ORDER — SODIUM CHLORIDE 0.9 % IV SOLN: 250.0000 mL | INTRAVENOUS | Status: DC | PRN

## 2020-03-24 MED ORDER — CITALOPRAM HYDROBROMIDE 20 MG PO TABS
10.0000 mg | ORAL_TABLET | Freq: Every day | ORAL | Status: DC
  Administered 2020-03-24 – 2020-03-28 (×5): 10 mg via ORAL
  Filled 2020-03-24 (×6): qty 1

## 2020-03-24 MED ORDER — FINASTERIDE 5 MG PO TABS
5.0000 mg | ORAL_TABLET | Freq: Every day | ORAL | Status: DC
  Administered 2020-03-24 – 2020-03-26 (×3): 5 mg via ORAL
  Filled 2020-03-24 (×7): qty 1

## 2020-03-24 MED ORDER — DILTIAZEM HCL ER COATED BEADS 180 MG PO CP24
420.0000 mg | ORAL_CAPSULE | Freq: Every day | ORAL | Status: DC
  Administered 2020-03-24 – 2020-03-28 (×5): 420 mg via ORAL
  Filled 2020-03-24 (×6): qty 1

## 2020-03-24 MED ORDER — CHLORTHALIDONE 50 MG PO TABS
50.0000 mg | ORAL_TABLET | Freq: Every day | ORAL | Status: DC
  Administered 2020-03-24 – 2020-03-28 (×5): 50 mg via ORAL
  Filled 2020-03-24 (×7): qty 1

## 2020-03-24 MED ORDER — ENOXAPARIN SODIUM 40 MG/0.4ML ~~LOC~~ SOLN
40.0000 mg | Freq: Every day | SUBCUTANEOUS | Status: DC
  Administered 2020-03-25 – 2020-03-29 (×5): 40 mg via SUBCUTANEOUS
  Filled 2020-03-24 (×6): qty 0.4

## 2020-03-24 MED ORDER — DILTIAZEM HCL ER COATED BEADS 420 MG PO TB24: 420.0000 mg | ORAL_TABLET | Freq: Every day | ORAL | Status: DC

## 2020-03-24 MED ORDER — SODIUM CHLORIDE 0.9% FLUSH
3.0000 mL | Freq: Two times a day (BID) | INTRAVENOUS | Status: DC
  Administered 2020-03-24 – 2020-03-29 (×6): 3 mL via INTRAVENOUS

## 2020-03-24 MED ORDER — INSULIN ASPART 100 UNIT/ML ~~LOC~~ SOLN
0.0000 [IU] | Freq: Three times a day (TID) | SUBCUTANEOUS | Status: DC
  Administered 2020-03-24: 1 [IU] via SUBCUTANEOUS

## 2020-03-24 MED ORDER — INSULIN ASPART 100 UNIT/ML ~~LOC~~ SOLN: 0.0000 [IU] | Freq: Three times a day (TID) | SUBCUTANEOUS | Status: DC

## 2020-03-24 MED ORDER — FLUTICASONE PROPIONATE 50 MCG/ACT NA SUSP
1.0000 | Freq: Every day | NASAL | Status: DC
  Administered 2020-03-25 – 2020-03-29 (×5): 1 via NASAL
  Filled 2020-03-24: qty 16

## 2020-03-24 NOTE — ED Notes (Signed)
Admitting MD in to assess pt. 

## 2020-03-24 NOTE — Progress Notes (Addendum)
NEUROLOGY PROGRESS NOTE  Subjective: Initially patient was sleepy however he awakens very quickly to stimuli.  Nurse tells me that he was very agitated last night, pulling out his IV, trying to hit nurses, pulling off condom cath.  At present time he is calm, but does not follow any commands.  Exam: Vitals:   03/24/20 0500 03/24/20 0600  BP:  (!) 184/86  Pulse: 74 97  Resp: 15 17  SpO2: 95% 100%   Physical Exam  Constitutional: Appears well-developed and well-nourished.  Psych: Confused Eyes: No scleral injection HENT: No OP obstrucion Head: Normocephalic.  Cardiovascular: Pulses easily palpable.  Respiratory: Effort normal, non-labored breathing GI: Soft.  No distension. There is no tenderness.  Skin: WDI   Neuro:  Mental Status: Easily arousable to light touch.  Remains confused and does not know where he is, month, year and has a hard time following simple commands. Cranial Nerves: II: Blinks to threat bilaterally III,IV, VI:  extra-ocular motions intact bilaterally pupils equal, round, reactive to light  V,VII: smile symmetric, facial light touch sensation normal bilaterally VIII: hearing normal bilaterally  Motor: Right : Upper extremity   5/5    Left:     Upper extremity   5/5  Lower extremity   5/5     Lower extremity   5/5 Sensory: Withdraws all extremities to slight noxious stimuli    Medications:  Scheduled: . chlorthalidone  50 mg Oral Daily  . citalopram  10 mg Oral Daily  . diltiazem  420 mg Oral Daily  . docusate sodium  100 mg Oral BID  . enoxaparin (LOVENOX) injection  40 mg Subcutaneous Daily  . famotidine  10 mg Oral BID  . finasteride  5 mg Oral QHS  . fluticasone  1 spray Each Nare Daily  . insulin aspart  0-15 Units Subcutaneous TID WC  . irbesartan  300 mg Oral Daily  . sodium chloride flush  3 mL Intravenous Q12H  . traZODone  25 mg Oral QHS   Continuous: . sodium chloride    . [START ON 03/25/2020] cefTRIAXone (ROCEPHIN)  IV      OEU:MPNTIR chloride, acetaminophen, labetalol, sodium chloride flush  Pertinent Labs/Diagnostics: -Hemoglobin A1c 5.1 -Urinalysis shows moderate leukocytes with white blood cell count greater than 50 -Ammonia 34 -TSH 0.751  CT HEAD CODE STROKE WO CONTRAST  Result Date: 03/23/2020  1. No acute intracranial abnormality or significant interval change. 2. Advanced atrophy and diffuse white matter disease. This likely reflects the sequela of chronic microvascular ischemia. 3. Remote lacunar infarcts of the thalami bilaterally. *ASPECTS is 10/10 * The above was relayed via text pager to Dr. Leonel Ramsay on 03/23/2020 at 15:37 . Electronically Signed   By: San Morelle M.D.   On: 03/23/2020 15:37   Assessment:  86 year old male presented to the hospital as code stroke secondary to altered mental status and right-sided weakness.  CT negative for stroke.  tPA not given secondary as patient was too good to treat.  Patient did show atrial fibrillation on arrival however he was not on anticoagulation.  At that time it was thought possible stroke/shower of emboli versus syncope versus lethargy.  UA that was obtained did show large amounts of white blood cells along with leukocytes.  At this point most likely etiology is confusion and a patient with dementia and now UTI.  However that said, still cannot rule out shower of emboli as he was in A. fib on arrival.   Impression: -Confusion -UTI Recommendations: -MRI brain  without contrast -EEG, as patient does have dementia and did suffer a period of nonresponsiveness.  Is not uncommon for patients with dementia to have seizures.  That said would not start antiepileptic medication unless EEG shows reason to. -At this point time do not feel that patient needs MRA of head and/or neck and less MRI shows stroke. -Treat UTI  Felicie Morn PA-C [facility] [phone] 03/24/2020, 8:21 AM  I have seen the patient and reviewed the above note.   At this point, I think it is more likely that this was lethargy secondary to UTI.  MRI is pending, and I think it is reasonable to pursue this, but if difficult to obtain, I would not pursue sedation or other aggressive measures to get it done.  If this is negative, no further recommendations.  Ritta Slot, MD [facility] [phone]  If 7pm- 7am, please page neurology on call as listed in AMION.

## 2020-03-24 NOTE — ED Notes (Signed)
Attempted report x1. 

## 2020-03-24 NOTE — ED Notes (Addendum)
Daughter, Mason Jim would like an update [phone]

## 2020-03-24 NOTE — H&P (Signed)
History and Physical    Jason Vasquez BZJ:[id number] DOB: [birth year] DOA: 03/23/2020  PCP: Miguel Aschoff, MD (Confirm with patient/family/NH records and if not entered, this has to be entered at [facility] point of entry) Patient coming from: SNF  I have personally briefly reviewed patient's old medical records in [facility]  Chief Complaint: encephalopathy  HPI: Jason Vasquez is a 86 y.o. male with medical history significant of  sleep apnea, hypertension, hyperlipidemia, diabetes and dementia.  Patient resides at [facility]  Today earlier in the day patient did have a fall and hit his head.  There did not seem to be any abnormal neurological symptoms after the fall.  Later he was in the family area with family when he suddenly became unresponsive and was laying on the floor.  At that time [facility] was called by the facility.  He slowly came around and EMS was called.  Nurse at [facility] states his blood pressure at that time was 198/72, he showed no seizure activity-heavy breathing, foaming at the mouth, jerking.  On arrival EMS noted patient was very lethargic, has slurred speech and was leaning to the right.  In route patient did vomit.  He was noticed to be in atrial fibrillation in route.  He is not on any anticoagulations.   ED Course: On arrival patient was lethargic, slow to respond.  He was brought immediately to CT to evaluate for possible subdural hematoma and/or stroke.  CT did not show any intracranial abnormalities.  When asked if he knew why he was at the hospital he said "I think I blanked out "and "I fell but I do not know what caused me to fall. Lab revealed Cr 1.3, nl BUN, Troponin #1 52, #2 47, WBC 5.6, Hgb 10.2 nl diff. TSH 0.751. U/A with mod LE, few bacteria, > 50 WBC/hpf. Abx were started in ED and urine sent for culture. Neuro did see patient in ED with recommnedation for MRI brain, MRA head/neck. [facility] called to admit patient for treatment UTI and mgt of encephalopathy.    Review of Systems: As per HPI otherwise 10 point review of systems negative. Caveat - patient deeply somnolent and unarousable - unable to give any information.   Past Medical History:  Diagnosis Date   Anemia    CKD (chronic kidney disease)    COPD (chronic obstructive pulmonary disease) (HCC)    Dementia (HCC)    Diabetes mellitus without complication (HCC)    Hyperlipemia    Hypertension    Sleep apnea     Past Surgical History:  Procedure Laterality Date   QUADRICEPS TENDON REPAIR Right 09/24/2019   Procedure: REPAIR RIGHT QUADRICEP TENDON;  Surgeon: Beverely Low, MD;  Location: [facility];  Service: Orthopedics;  Laterality: Right;   Soc Hx -  Widowed. Four children, one passed. Four grandchildren, two great grands. Worked in a [employer]. Resides in long term care.   reports that he has never smoked. He has never used smokeless tobacco. He reports that he does not drink alcohol or use drugs.  Allergies  Allergen Reactions   Ace Inhibitors Cough   Ciprofloxacin Rash    Other reaction(s): RASH    Levofloxacin Rash    Other reaction(s): RASH    Metformin Diarrhea    Other reaction(s): DIARRHEA    Neomy-Bacit-Polymyx-Pramoxine Rash    Patient is allergic to all antibiotics except Doxycycline Patient is allergic to all antibiotics except Doxycycline    Tramadol Nausea Only  Other reaction(s): NAUSEA     Family History  Problem Relation Age of Onset   Hypertension Mother    Hypertension Father      Prior to Admission medications   Medication Sig Start Date End Date Taking? Authorizing Provider  acetaminophen (TYLENOL) 650 MG CR tablet Take 650 mg by mouth every 8 (eight) hours as needed for pain.     [provider]  chlorthalidone (HYGROTON) 50 MG tablet Take 50 mg by mouth daily.    [provider]  Cholecalciferol (VITAMIN D3) 1.25 MG (50000 UT) CAPS Take 1.25 mg by mouth every 30 (thirty) days.    [provider]  citalopram (CELEXA) 10 MG tablet Take 10 mg by mouth daily.    [provider]  diltiazem (CARDIZEM LA) 420 MG 24 hr tablet Take 420 mg by mouth daily. 05/17/17   [provider]  enoxaparin (LOVENOX) 30 MG/0.3ML injection Inject 0.3 mLs (30 mg total) into the skin every 12 (twelve) hours. 09/26/19 10/26/19  Beverely Low, MD  famotidine (PEPCID) 10 MG tablet Take 10 mg by mouth 2 (two) times daily.    [provider]  finasteride (PROSCAR) 5 MG tablet Take 5 mg by mouth at bedtime.  12/01/15   [provider]  fluticasone (FLONASE) 50 MCG/ACT nasal spray Place 1 spray into both nostrils daily.    [provider]  Menthol, Topical Analgesic, (BIOFREEZE EX) Apply 1 application topically 4 (four) times daily. Each knee    [provider]  methocarbamol (ROBAXIN) 500 MG tablet Take 1 tablet (500 mg total) by mouth every 6 (six) hours as needed for muscle spasms. 09/26/19   Beverely Low, MD  telmisartan (MICARDIS) 80 MG tablet Take 80 mg by mouth daily.    [provider]  tolnaftate (TINACTIN) 1 % powder Apply 1 application topically 2 (two) times daily. feet    [provider]  traZODone (DESYREL) 50 MG tablet Take 25 mg by mouth at bedtime.    [provider]    Physical Exam: Vitals:   03/23/20 2230 03/23/20 2300 03/23/20 2330 03/24/20 0000  BP: (!) 165/72 139/78 (!) 186/82 (!) 179/86  Pulse: 82 75 82 79  Resp: 20 14 (!) 22 14  SpO2: 100% 100% 100% 99%  Weight:      Height:        Constitutional: NAD, calm, comfortable Vitals:   03/23/20 2230 03/23/20 2300 03/23/20 2330 03/24/20 0000  BP: (!) 165/72 139/78 (!) 186/82 (!) 179/86  Pulse: 82 75 82 79  Resp: 20 14 (!) 22 14  SpO2: 100% 100% 100% 99%  Weight:      Height:       General: elderly man who is unreponsive, sonorous. Eyes: PERRL, lids and conjunctivae normal ENMT: Mucous membranes are moist. Posterior pharynx  Could not be  examined.Native dentition.  Neck: normal, stiff, no masses, no thyromegaly Respiratory: clear to auscultation bilaterally, no wheezing, no crackles. Normal respiratory effort. No accessory muscle use.  Cardiovascular: Regular rate and rhythm, no murmurs / rubs / gallops. No extremity edema. 1+ pedal pulses. No carotid bruits.  Abdomen: no tenderness, no masses palpated. No hepatosplenomegaly. Bowel sounds positive.  Musculoskeletal: Rigidity diffusely, contracture of both lower extremities. Skin: no rashes, lesions, ulcers. No induration Neurologic: CN 2-12 grossly intact: no facial asymmetry, vision could not be tested. Further exam limited by somnolence, rgidity. Psychiatric: responds to sternal rub. Does not remain awake. H/o dementia but could not test. Daughter reports  that he was responsive and verbal until the last 48 hours.    Labs on Admission: I have personally reviewed following labs and imaging studies  CBC: Recent Labs  Lab 03/23/20 1515 03/23/20 1520  WBC 5.6  --   NEUTROABS 2.6  --   HGB 9.9* 10.2*  HCT 30.2* 30.0*  MCV 102.0*  --   PLT 267  --    Basic Metabolic Panel: Recent Labs  Lab 03/23/20 1515 03/23/20 1520  NA 141 141  K 3.9 3.7  CL 105 102  CO2 25  --   GLUCOSE 103* 99  BUN 17 17  CREATININE 1.44* 1.30*  CALCIUM 9.0  --    GFR: Estimated Creatinine Clearance: 43.1 mL/min (A) (by C-G formula based on SCr of 1.3 mg/dL (H)). Liver Function Tests: Recent Labs  Lab 03/23/20 1515  AST 28  ALT 17  ALKPHOS 50  BILITOT 0.8  PROT 6.5  ALBUMIN 3.0*   No results for input(s): LIPASE, AMYLASE in the last 168 hours. Recent Labs  Lab 03/23/20 1642  AMMONIA 34   Coagulation Profile: Recent Labs  Lab 03/23/20 1515  INR 1.1   Cardiac Enzymes: No results for input(s): CKTOTAL, CKMB, CKMBINDEX, TROPONINI in the last 168 hours. BNP (last 3 results) No results for input(s): PROBNP in the last 8760 hours. HbA1C: No results for input(s): HGBA1C in  the last 72 hours. CBG: Recent Labs  Lab 03/23/20 1514  GLUCAP 95   Lipid Profile: No results for input(s): CHOL, HDL, LDLCALC, TRIG, CHOLHDL, LDLDIRECT in the last 72 hours. Thyroid Function Tests: Recent Labs    03/23/20 1642  TSH 0.751   Anemia Panel: No results for input(s): VITAMINB12, FOLATE, FERRITIN, TIBC, IRON, RETICCTPCT in the last 72 hours. Urine analysis:    Component Value Date/Time   COLORURINE YELLOW 03/23/2020 1900   APPEARANCEUR CLEAR 03/23/2020 1900   LABSPEC 1.012 03/23/2020 1900   PHURINE 5.0 03/23/2020 1900   GLUCOSEU NEGATIVE 03/23/2020 1900   HGBUR NEGATIVE 03/23/2020 1900   BILIRUBINUR NEGATIVE 03/23/2020 1900   KETONESUR NEGATIVE 03/23/2020 1900   PROTEINUR NEGATIVE 03/23/2020 1900   NITRITE NEGATIVE 03/23/2020 1900   LEUKOCYTESUR MODERATE (A) 03/23/2020 1900    Radiological Exams on Admission: DG Pelvis Portable  Result Date: 03/23/2020 CLINICAL DATA:  Fall EXAM: PORTABLE PELVIS 1-2 VIEWS COMPARISON:  None. FINDINGS: No acute bony abnormality. Specifically, no fracture, subluxation, or dislocation. Hip joints are symmetric. IMPRESSION: No acute bony abnormality. Electronically Signed   By: Rolm Baptise M.D.   On: 03/23/2020 16:54   DG Chest Portable 1 View  Result Date: 03/23/2020 CLINICAL DATA:  Fall EXAM: PORTABLE CHEST 1 VIEW COMPARISON:  None. FINDINGS: Mild cardiomegaly. Interstitial prominence within the lungs could reflect interstitial edema. This is improved since prior study. No effusions or pneumothorax. No acute bony abnormality. IMPRESSION: Diffuse interstitial prominence, likely interstitial edema, improved since prior study. Electronically Signed   By: Rolm Baptise M.D.   On: 03/23/2020 16:53   DG Shoulder Left Portable  Result Date: 03/23/2020 CLINICAL DATA:  Left shoulder pain. EXAM: LEFT SHOULDER COMPARISON:  None. FINDINGS: There is no evidence of fracture or dislocation. Mild to moderate severity degenerative changes seen  involving the left acromioclavicular joint and left acromion. Soft tissues are unremarkable. IMPRESSION: Mild to moderate severity degenerative changes without evidence of acute fracture or dislocation. Electronically Signed   By: Virgina Norfolk M.D.   On: 03/23/2020 21:41   CT HEAD CODE STROKE WO CONTRAST  Result  Date: 03/23/2020 CLINICAL DATA:  Code stroke. Ataxia. Right-sided weakness. Altered mental status. EXAM: CT HEAD WITHOUT CONTRAST TECHNIQUE: Contiguous axial images were obtained from the base of the skull through the vertex without intravenous contrast. COMPARISON:  CT head 09/15/2019 at [facility]. FINDINGS: Brain: Advanced atrophy and diffuse white matter disease is present. There are new cortical infarct is present. Basal ganglia are stable. Insular cortex is normal. Remote lacunar infarcts are present in the thalami bilaterally. Chronic white matter changes extend into the brainstem. Cerebellum is normal. Vascular: No hyperdense vessel or unexpected calcification. Skull: Calvarium is intact. No focal lytic or blastic lesions are present. No significant extracranial soft tissue lesion is present. Sinuses/Orbits: The paranasal sinuses and mastoid air cells are clear. The globes and orbits are within normal limits. ASPECTS [facility] Stroke Program Early CT Score) - Ganglionic level infarction (caudate, lentiform nuclei, internal capsule, insula, M1-M3 cortex): 7/7 - Supraganglionic infarction (M4-M6 cortex): 3/3 Total score (0-10 with 10 being normal): 10/10 IMPRESSION: 1. No acute intracranial abnormality or significant interval change. 2. Advanced atrophy and diffuse white matter disease. This likely reflects the sequela of chronic microvascular ischemia. 3. Remote lacunar infarcts of the thalami bilaterally. *ASPECTS is 10/10 * The above was relayed via text pager to Dr. Amada Jupiter on 03/23/2020 at 15:37 . Electronically Signed   By: Marin Roberts M.D.    On: 03/23/2020 15:37    EKG: Independently reviewed. Undetermined rhythm, low voltage. Per report EMS stated he was in a fib when transported.  Assessment/Plan Active Problems:   Acute lower UTI   Acute metabolic encephalopathy   CKD (chronic kidney disease), stage III   HLD (hyperlipidemia)   DM2 (diabetes mellitus, type 2) (HCC)   Encephalopathy  (please populate well all problems here in Problem List. (For example, if patient is on BP meds at home and you resume or decide to hold them, it is a problem that needs to be her. Same for CAD, COPD, HLD and so on)   1. Encephalopathy - patient was slow to respond on initial presentation. Stroke probability low per neuro. He was agitated when taken for MRI and was sedated which may have contributed to his unresponsive state. He may have encephalopathy related to moderate UTI, other labs normal including serum ammonia level. Plan Treat infection  Monitor labs  MRI, MRA head/neck when able to undergo study.  Neuro will follow.  2. UTI- positive U/A. Culture pending.He was given rocephin in ED Plan Continue rocephin pending culture and ss.  3. DM -  Plan Add A1C to lab  Sliding scale coverage.  4. Code status - discussed with daughter: DNR/DNI  5. Dispo - return to long term care when medically stable.   DVT prophylaxis: lovenox  Code Status: DNR (Full/Partial (specify details) Family Communication: spoke with daughter Mickel Crow - explained situation and plan.  Disposition Plan: return to SNF when stable  Consults called: Neuro - Dr. Amada Jupiter  Admission status: inpatient    Illene Regulus MD [facility] Pager [phone]  If 7PM-7AM, please contact night-coverage [email or web address] Password [facility]  03/24/2020, 12:21 AM

## 2020-03-24 NOTE — ED Notes (Signed)
Last BP 184/86. Admitting [facility] Paged informed trending high BP. Awaiting orders

## 2020-03-24 NOTE — ED Notes (Addendum)
Daughter states that UTI has made her father confused in past--states that he also has no hx of blindness, also states that he is more alert and oriented normally at the nursing home.  Daughter also requests that pt be kept in hospital until he is more alert and back to his normal baseline

## 2020-03-24 NOTE — Procedures (Addendum)
Patient Name: Jason Vasquez  MRN: [id number]  Epilepsy Attending: Charlsie Quest  Referring Physician/Provider: Felicie Morn, PA Date: 03/23/2020 Duration: 24.58 minutes  Patient history: 86 year old male presented with altered mental status and right-sided weakness.  EEG evaluate for seizures.  Level of alertness: Awake, asleep  AEDs during EEG study: None  Technical aspects: This EEG study was done with scalp electrodes positioned according to the 10-20 International system of electrode placement. Electrical activity was acquired at a sampling rate of 500Hz  and reviewed with a high frequency filter of 70Hz  and a low frequency filter of 1Hz . EEG data were recorded continuously and digitally stored.   Description: During awake state, no clear posterior dominant was seen.  Sleep was characterized by vertex waves, sleep spindles (12-14Hz ), maximal frontocentral region.  EEG showed condyle generalized low amplitude 3 to 6 Hz theta delta slowing.  Hyperventilation and photic stimulation were not performed.   Single-lead EKG also showed evidence of irregularly irregular heart rhythm.  Abnormality -Continuous slow, generalized  IMPRESSION: This study is suggestive of moderate diffuse encephalopathy, nonspecific etiology No seizures or epileptiform discharges were seen throughout the recording.  Ted Leonhart 

## 2020-03-24 NOTE — ED Notes (Signed)
Lunch Tray Ordered @ 1043. 

## 2020-03-24 NOTE — Progress Notes (Signed)
PROGRESS NOTE  Jason Vasquez HCW:[id number] DOB: Dec 02, 1928 DOA: 03/23/2020 PCP: Angelica Pou, MD  HPI/Recap of past 24 hours: HPI from Dr Linda Hedges Clever Jason Vasquez is a 86 y.o. male with medical history significant of sleep apnea, hypertension, hyperlipidemia, diabetes and dementia. Patient resides at [facility]. PTA, patient did have a fall and hit his head, later he was in the family area with family when he suddenly became unresponsive and was laying on the floor. At that time CODE BLUE was called by the facility. He slowly came around and EMS was called. Nurse at [facility] states his blood pressure at that time was 198/72, he showed no seizure like activity. On arrival EMS noted patient was very lethargic, has slurred speech and was leaning to the right. In route patient did vomit. He was noticed to be in atrial fibrillation in route. He is not on any anticoagulations. In the ED, patient was noted to be lethargic, slow to respond. He was brought immediately to CT to evaluate for possible subdural hematoma and/or stroke. CT did not show any intracranial abnormalities. Lab revealed Cr 1.3, nl BUN, Troponin #1 52, #2 47, WBC 5.6, Hgb 10.2 nl diff. TSH 0.751. U/A with mod LE, few bacteria, > 50 WBC/hpf. Abx were started in ED and urine sent for culture. Neuro did see patient in ED with recommnedation for MRI brain, MRA head/neck. TR[facility] called to admit patient for treatment UTI and mgt of encephalopathy.     Today, patient was initially noted to be restless, agitated, lorazepam was given for sedation, but patient still unable to complete MRI.  Upon seeing patient in the ED today, patient was awake, alert but disoriented, which does fluctuate.  Not able to properly follow commands, unable to perform neuro examination.  Patient appeared calm on my examination, looked comfortable.   Assessment/Plan: Active Problems:   CKD (chronic kidney disease), stage III   HLD (hyperlipidemia)   DM2  (diabetes mellitus, type 2) (HCC)   Acute lower UTI   Acute metabolic encephalopathy   Encephalopathy  Acute metabolic encephalopathy Underlying history of dementia Likely 2/2 ?UTI As per daughter, patient is not back to baseline Currently afebrile, with no leukocytosis UA showed moderate leukocytes, few bacteria, greater than 50 WBC UC pending, BC x2 pending Chest x-ray showed diffuse interstitial prominence, likely interstitial edema although improved since prior study CT head unremarkable EEG suggestive of moderate diffuse encephalopathy, nonspecific etiology, no seizure or epileptiform discharges were seen throughout the recording MRI brain/head pending Neurology on board, appreciate recs Fall precautions, monitor closely  Possible UTI Urine as noted above, UC pending Continue IV Rocephin for now pending UC  CKD stage IIIa Creatinine at baseline Daily BMP  Anemia of chronic kidney disease Hemoglobin currently slightly trended down No obvious signs of bleeding Daily CBC  Diabetes mellitus type 2 Last A1c 5.1 on 03/24/2020, well-controlled Episode of hypoglycemia on 03/24/2020, poor oral intake SSI (changed to sensitive), Accu-Cheks, hypoglycemic protocol Regular diet for now  Hypertension Continue chlorthalidone, diltiazem, Avapro, labetalol as needed  Dementia Delirium precautions      Malnutrition Type:      Malnutrition Characteristics:      Nutrition Interventions:       Estimated body mass index is 25.23 kg/m as calculated from the following:   Height as of this encounter: 6' (1.829 m).   Weight as of this encounter: 84.4 kg.     Code Status: DNR  Family Communication: Plan to discuss with family  Disposition Plan: Status is: Inpatient     Remains inpatient appropriate because:Altered mental status   Dispo: The patient is from: SNF              Anticipated d/c is to: SNF              Anticipated d/c date is: 2 days               Patient currently is not medically stable to d/c.  Pending further work-up, improvement in clinical status, neurology signing off    Consultants:  Neurology  Procedures:  None  Antimicrobials:  Ceftriaxone  DVT prophylaxis: Lovenox   Objective: Vitals:   03/24/20 0300 03/24/20 0400 03/24/20 0500 03/24/20 0600  BP: (!) 167/99 (!) 178/150  (!) 184/86  Pulse: 76 93 74 97  Resp: 14 15 15 17   SpO2: 99% 95% 95% 100%  Weight:      Height:       No intake or output data in the 24 hours ending 03/24/20 1416 Filed Weights   03/23/20 1537  Weight: 84.4 kg    Exam:  General: NAD, disoriented, alert, unable to follow commands  Cardiovascular: S1, S2 present  Respiratory: CTAB  Abdomen: Soft, nontender, nondistended, bowel sounds present  Musculoskeletal: No bilateral pedal edema noted  Skin: Normal  Psychiatry: Normal mood  Neurology: Unable to follow commands    Data Reviewed: CBC: Recent Labs  Lab 03/23/20 1515 03/23/20 1520 03/24/20 0744  WBC 5.6  --  4.2  NEUTROABS 2.6  --  2.1  HGB 9.9* 10.2* 9.0*  HCT 30.2* 30.0* 29.0*  MCV 102.0*  --  107.8*  PLT 267  --  249   Basic Metabolic Panel: Recent Labs  Lab 03/23/20 1515 03/23/20 1520 03/24/20 0744  NA 141 141 141  K 3.9 3.7 4.0  CL 105 102 106  CO2 25  --  26  GLUCOSE 103* 99 78  BUN 17 17 15   CREATININE 1.44* 1.30* 1.44*  CALCIUM 9.0  --  9.1   GFR: Estimated Creatinine Clearance: 38.9 mL/min (A) (by C-G formula based on SCr of 1.44 mg/dL (H)). Liver Function Tests: Recent Labs  Lab 03/23/20 1515  AST 28  ALT 17  ALKPHOS 50  BILITOT 0.8  PROT 6.5  ALBUMIN 3.0*   No results for input(s): LIPASE, AMYLASE in the last 168 hours. Recent Labs  Lab 03/23/20 1642  AMMONIA 34   Coagulation Profile: Recent Labs  Lab 03/23/20 1515  INR 1.1   Cardiac Enzymes: No results for input(s): CKTOTAL, CKMB, CKMBINDEX, TROPONINI in the last 168 hours. BNP (last 3 results) No results for  input(s): PROBNP in the last 8760 hours. HbA1C: Recent Labs    03/24/20 0744  HGBA1C 5.1   CBG: Recent Labs  Lab 03/23/20 1514 03/24/20 0740 03/24/20 1100  GLUCAP 95 72 67*   Lipid Profile: No results for input(s): CHOL, HDL, LDLCALC, TRIG, CHOLHDL, LDLDIRECT in the last 72 hours. Thyroid Function Tests: Recent Labs    03/23/20 1642  TSH 0.751   Anemia Panel: No results for input(s): VITAMINB12, FOLATE, FERRITIN, TIBC, IRON, RETICCTPCT in the last 72 hours. Urine analysis:    Component Value Date/Time   COLORURINE YELLOW 03/23/2020 1900   APPEARANCEUR CLEAR 03/23/2020 1900   LABSPEC 1.012 03/23/2020 1900   PHURINE 5.0 03/23/2020 1900   GLUCOSEU NEGATIVE 03/23/2020 1900   HGBUR NEGATIVE 03/23/2020 1900   BILIRUBINUR NEGATIVE 03/23/2020 1900   KETONESUR NEGATIVE 03/23/2020 1900  PROTEINUR NEGATIVE 03/23/2020 1900   NITRITE NEGATIVE 03/23/2020 1900   LEUKOCYTESUR MODERATE (A) 03/23/2020 1900   Sepsis Labs: @LABRCNTIP (procalcitonin:4,lacticidven:4)  ) Recent Results (from the past 240 hour(s))  SARS Coronavirus 2 by RT PCR (hospital order, performed in [facility] hospital lab) Nasopharyngeal Nasopharyngeal Swab     Status: None   Collection Time: 03/23/20  9:49 PM   Specimen: Nasopharyngeal Swab  Result Value Ref Range Status   SARS Coronavirus 2 NEGATIVE NEGATIVE Final    Comment: (NOTE) SARS-CoV-2 target nucleic acids are NOT DETECTED. The SARS-CoV-2 RNA is generally detectable in upper and lower respiratory specimens during the acute phase of infection. The lowest concentration of SARS-CoV-2 viral copies this assay can detect is 250 copies / mL. A negative result does not preclude SARS-CoV-2 infection and should not be used as the sole basis for treatment or other patient management decisions.  A negative result may occur with improper specimen collection / handling, submission of specimen other than nasopharyngeal swab, presence of viral mutation(s) within  the areas targeted by this assay, and inadequate number of viral copies (<250 copies / mL). A negative result must be combined with clinical observations, patient history, and epidemiological information. Fact Sheet for Patients:   05/23/20 Fact Sheet for Healthcare Providers: BoilerBrush.com.cy This test is not yet approved or cleared  by the https://pope.com/ FDA and has been authorized for detection and/or diagnosis of SARS-CoV-2 by FDA under an Emergency Use Authorization (EUA).  This EUA will remain in effect (meaning this test can be used) for the duration of the COVID-19 declaration under Section 564(b)(1) of the Act, 21 U.S.C. section 360bbb-3(b)(1), unless the authorization is terminated or revoked sooner. Performed at [facility], [address] [place], [place]       Studies: EEG  Result Date: 03/24/2020 05/24/2020, MD     03/24/2020  9:15 AM Patient Name: Lauren Aguayo MRN: Jettie Booze Epilepsy Attending: [id number] Referring Physician/Provider: Charlsie Quest, PA Date: 03/23/2020 Duration: 24.58 minutes Patient history: 86 year old male presented with altered mental status and right-sided weakness.  EEG evaluate for seizures. Level of alertness: Awake, asleep AEDs during EEG study: None Technical aspects: This EEG study was done with scalp electrodes positioned according to the 10-20 International system of electrode placement. Electrical activity was acquired at a sampling rate of 500Hz  and reviewed with a high frequency filter of 70Hz  and a low frequency filter of 1Hz . EEG data were recorded continuously and digitally stored. Description: During awake state, no clear posterior dominant was seen.  Sleep was characterized by vertex waves, sleep spindles (12-14Hz ), maximal frontocentral region.  EEG showed condyle generalized low amplitude 3 to 6 Hz theta delta slowing.  Hyperventilation and photic  stimulation were not performed. Single-lead EKG also showed evidence of irregularly irregular heart rhythm. Abnormality -Continuous slow, generalized IMPRESSION: This study is suggestive of moderate diffuse encephalopathy, nonspecific etiology No seizures or epileptiform discharges were seen throughout the recording. 98   DG Pelvis Portable  Result Date: 03/23/2020 CLINICAL DATA:  Fall EXAM: PORTABLE PELVIS 1-2 VIEWS COMPARISON:  None. FINDINGS: No acute bony abnormality. Specifically, no fracture, subluxation, or dislocation. Hip joints are symmetric. IMPRESSION: No acute bony abnormality. Electronically Signed   By: M.D.   On: 03/23/2020 16:54   DG Chest Portable 1 View  Result Date: 03/23/2020 CLINICAL DATA:  Fall EXAM: PORTABLE CHEST 1 VIEW COMPARISON:  None. FINDINGS: Mild cardiomegaly. Interstitial prominence within the lungs could  reflect interstitial edema. This is improved since prior study. No effusions or pneumothorax. No acute bony abnormality. IMPRESSION: Diffuse interstitial prominence, likely interstitial edema, improved since prior study. Electronically Signed   By: Charlett Nose M.D.   On: 03/23/2020 16:53   DG Shoulder Left Portable  Result Date: 03/23/2020 CLINICAL DATA:  Left shoulder pain. EXAM: LEFT SHOULDER COMPARISON:  None. FINDINGS: There is no evidence of fracture or dislocation. Mild to moderate severity degenerative changes seen involving the left acromioclavicular joint and left acromion. Soft tissues are unremarkable. IMPRESSION: Mild to moderate severity degenerative changes without evidence of acute fracture or dislocation. Electronically Signed   By: Aram Candela M.D.   On: 03/23/2020 21:41   CT HEAD CODE STROKE WO CONTRAST  Result Date: 03/23/2020 CLINICAL DATA:  Code stroke. Ataxia. Right-sided weakness. Altered mental status. EXAM: CT HEAD WITHOUT CONTRAST TECHNIQUE: Contiguous axial images were obtained from the base of the skull  through the vertex without intravenous contrast. COMPARISON:  CT head 09/15/2019 at [facility]. FINDINGS: Brain: Advanced atrophy and diffuse white matter disease is present. There are new cortical infarct is present. Basal ganglia are stable. Insular cortex is normal. Remote lacunar infarcts are present in the thalami bilaterally. Chronic white matter changes extend into the brainstem. Cerebellum is normal. Vascular: No hyperdense vessel or unexpected calcification. Skull: Calvarium is intact. No focal lytic or blastic lesions are present. No significant extracranial soft tissue lesion is present. Sinuses/Orbits: The paranasal sinuses and mastoid air cells are clear. The globes and orbits are within normal limits. ASPECTS [facility] Stroke Program Early CT Score) - Ganglionic level infarction (caudate, lentiform nuclei, internal capsule, insula, M1-M3 cortex): 7/7 - Supraganglionic infarction (M4-M6 cortex): 3/3 Total score (0-10 with 10 being normal): 10/10 IMPRESSION: 1. No acute intracranial abnormality or significant interval change. 2. Advanced atrophy and diffuse white matter disease. This likely reflects the sequela of chronic microvascular ischemia. 3. Remote lacunar infarcts of the thalami bilaterally. *ASPECTS is 10/10 * The above was relayed via text pager to Dr. Amada Jupiter on 03/23/2020 at 15:37 . Electronically Signed   By: Marin Roberts M.D.   On: 03/23/2020 15:37    Scheduled Meds: . chlorthalidone  50 mg Oral Daily  . citalopram  10 mg Oral Daily  . diltiazem  420 mg Oral Daily  . docusate sodium  100 mg Oral BID  . enoxaparin (LOVENOX) injection  40 mg Subcutaneous Daily  . famotidine  10 mg Oral BID  . finasteride  5 mg Oral QHS  . fluticasone  1 spray Each Nare Daily  . insulin aspart  0-15 Units Subcutaneous TID WC  . irbesartan  300 mg Oral Daily  . sodium chloride flush  3 mL Intravenous Q12H  . traZODone  25 mg Oral QHS    Continuous  Infusions: . sodium chloride    . [START ON 03/25/2020] cefTRIAXone (ROCEPHIN)  IV       LOS: 0 days     Briant Cedar, MD [facility]  If 7PM-7AM, please contact night-coverage [email or web address] 03/24/2020, 2:16 PM

## 2020-03-24 NOTE — ED Notes (Signed)
Pt cooperative for IV start- is oriented to own birthdate, daughters names.

## 2020-03-24 NOTE — ED Notes (Signed)
Unable to complete MRI- daughter at bedside feeding pt.

## 2020-03-24 NOTE — ED Notes (Signed)
Pt is incontinent of urine and stool- cleaned and all linens changed, has pulled IV out and Condom cath off-- moved pt to rm 3 closer to nurses desk, taken off monitor to minimize agitation. Trying to bite and hit staff.

## 2020-03-25 DIAGNOSIS — G9341 Metabolic encephalopathy: Secondary | ICD-10-CM

## 2020-03-25 LAB — BASIC METABOLIC PANEL
Anion gap: 11 (ref 5–15)
BUN: 16 mg/dL (ref 8–23)
CO2: 25 mmol/L (ref 22–32)
Calcium: 9.1 mg/dL (ref 8.9–10.3)
Chloride: 104 mmol/L (ref 98–111)
Creatinine, Ser: 1.43 mg/dL — ABNORMAL HIGH (ref 0.61–1.24)
GFR calc Af Amer: 50 mL/min — ABNORMAL LOW (ref >60)
GFR calc non Af Amer: 43 mL/min — ABNORMAL LOW (ref >60)
Glucose, Bld: 100 mg/dL — ABNORMAL HIGH (ref 70–99)
Potassium: 3.7 mmol/L (ref 3.5–5.1)
Sodium: 140 mmol/L (ref 135–145)

## 2020-03-25 LAB — CBC WITH DIFFERENTIAL/PLATELET
Abs Immature Granulocytes: 0.02 10*3/uL (ref 0.00–0.07)
Basophils Absolute: 0 10*3/uL (ref 0.0–0.1)
Basophils Relative: 1 %
Eosinophils Absolute: 0.4 10*3/uL (ref 0.0–0.5)
Eosinophils Relative: 6 %
HCT: 32 % — ABNORMAL LOW (ref 39.0–52.0)
Hemoglobin: 10.6 g/dL — ABNORMAL LOW (ref 13.0–17.0)
Immature Granulocytes: 0 %
Lymphocytes Relative: 23 %
Lymphs Abs: 1.3 10*3/uL (ref 0.7–4.0)
MCH: 33.5 pg (ref 26.0–34.0)
MCHC: 33.1 g/dL (ref 30.0–36.0)
MCV: 101.3 fL — ABNORMAL HIGH (ref 80.0–100.0)
Monocytes Absolute: 0.5 10*3/uL (ref 0.1–1.0)
Monocytes Relative: 8 %
Neutro Abs: 3.7 10*3/uL (ref 1.7–7.7)
Neutrophils Relative %: 62 %
Platelets: 318 10*3/uL (ref 150–400)
RBC: 3.16 MIL/uL — ABNORMAL LOW (ref 4.22–5.81)
RDW: 13.2 % (ref 11.5–15.5)
WBC: 5.9 10*3/uL (ref 4.0–10.5)
nRBC: 0 % (ref 0.0–0.2)

## 2020-03-25 LAB — URINE CULTURE: Culture: 90000 — AB

## 2020-03-25 LAB — GLUCOSE, CAPILLARY
Glucose-Capillary: 62 mg/dL — ABNORMAL LOW (ref 70–99)
Glucose-Capillary: 119 mg/dL — ABNORMAL HIGH (ref 70–99)
Glucose-Capillary: 123 mg/dL — ABNORMAL HIGH (ref 70–99)

## 2020-03-25 MED ORDER — SODIUM CHLORIDE 0.9 % IV SOLN
1.0000 g | INTRAVENOUS | Status: DC
  Administered 2020-03-25 – 2020-03-27 (×3): 1 g via INTRAVENOUS
  Filled 2020-03-25 (×3): qty 1

## 2020-03-25 NOTE — Progress Notes (Signed)
Though MRI limited, it did rule out infarct. I suspect encephalopathy was due to UTI.   No further recommendations, please call with further questions.   Ritta Slot, MD [facility] [phone]  If 7pm- 7am, please page neurology on call as listed in AMION.

## 2020-03-25 NOTE — Progress Notes (Signed)
Pharmacy Antibiotic Note  Jason Vasquez is a 86 y.o. male admitted on 03/23/2020 with UTI.  Pharmacy has been consulted for Cefepime  dosing.  Plan: Cefepime 1 gram iv Q 24 hours Follow up urine culture to optimize antibiotic therapy  Height: 6' (182.9 cm) Weight: 84.4 kg (186 lb) IBW/kg (Calculated) : 77.6  Temp (24hrs), Avg:98.2 F (36.8 C), Min:97.4 F (36.3 C), Max:99.1 F (37.3 C)  Recent Labs  Lab 03/23/20 1515 03/23/20 1520 03/24/20 0744  WBC 5.6  --  4.2  CREATININE 1.44* 1.30* 1.44*    Estimated Creatinine Clearance: 38.2 mL/min (A) (by C-G formula based on SCr of 1.44 mg/dL (H)).    Allergies  Allergen Reactions  . Ace Inhibitors Cough  . Ciprofloxacin Rash    Other reaction(s): RASH   . Levofloxacin Rash    Other reaction(s): RASH   . Metformin Diarrhea    Other reaction(s): DIARRHEA   . Neomy-Bacit-Polymyx-Pramoxine Rash    Patient is allergic to all antibiotics except Doxycycline Patient is allergic to all antibiotics except Doxycycline   . Tramadol Nausea Only    Other reaction(s): NAUSEA     Thank you Okey Regal, PharmD  03/25/2020 8:17 AM

## 2020-03-25 NOTE — Progress Notes (Signed)
Inpatient Diabetes Program Recommendations  AACE/ADA: New Consensus Statement on Inpatient Glycemic Control (2015)  Target Ranges:  Prepandial:   less than 140 mg/dL      Peak postprandial:   less than 180 mg/dL (1-2 hours)      Critically ill patients:  140 - 180 mg/dL   Lab Results  Component Value Date   GLUCAP 62 (L) 03/25/2020   HGBA1C 5.1 03/24/2020    Review of Glycemic Control Results for BURNS, TIMSON (MRN [id number]) as of 03/25/2020 10:15  Ref. Range 03/24/2020 11:00 03/24/2020 17:29 03/24/2020 21:05 03/25/2020 07:47  Glucose-Capillary Latest Ref Range: 70 - 99 mg/dL 67 (L) 449 (H) 675 (H) 62 (L)   Diabetes history: Pre DM Outpatient Diabetes medications: none Current orders for Inpatient glycemic control: Novolog 0-9 units TID  Inpatient Diabetes Program Recommendations:    Noted mild hypoglycemia this AM of 67 mg/dL. Patient received one unit of Novolog yesterday.  May want to consider discontinuing correction and keeping CBG checks for TID & HS.   Thanks, Lujean Rave, MSN, RNC-OB Diabetes Coordinator [phone] (8a-5p)

## 2020-03-25 NOTE — Evaluation (Signed)
Physical Therapy Evaluation & Discharge Patient Details Name: Jason Vasquez MRN: [id number] DOB: [birth year] Today's Date: 03/25/2020   History of Present Illness  Pt is an 86 y.o. male resident at [facility] admitted 03/23/20 after fall hitting his head, later that day becoming unresponsive with CODE BLUE called by facility; EMS noted lethargy, slurred speech and R-side leaning, afib. Head CT without acute abnormality. Head MRI negative for acute infarct. Pelvic and L shoulder imaging without acute injury. Workup for acute encephalopathy secondary to UTI. PMH includes dementia, OSA, HTN, DM, R quadriceps tendon rupture (s/p repair 09/24/19), COVID+ with CGV admit.    Clinical Impression  Patient evaluated by Physical Therapy with no further acute PT needs identified. PTA, pt resident at SNF, likely requiring assist for all mobility. Today, pt required up to maxA+2 for limited mobility and seated EOB activity; limited by generalized weakness, R knee flexor contracture, impaired cognition. Pt pleasantly confused majority of session, only oriented to self. All education has been completed and the patient has no further questions. Acute PT is signing off. Thank you for this referral.    Follow Up Recommendations SNF;Supervision/Assistance - 24 hour(return to SNF/LTC)    Equipment Recommendations  None recommended by PT    Recommendations for Other Services       Precautions / Restrictions Precautions Precautions: Fall;Other (comment) Precaution Comments: Incontinence; safety sitter      Mobility  Bed Mobility Overal bed mobility: Needs Assistance Bed Mobility: Supine to Sit;Sit to Supine     Supine to sit: Max assist;+2 for physical assistance Sit to supine: Max assist;+2 for physical assistance   General bed mobility comments: Pt hesitant to mobilize, maxA+2 to elevate trunk and manage BLEs  Transfers                 General transfer comment: Pt adamant against  standing  Ambulation/Gait                Stairs            Wheelchair Mobility    Modified Rankin (Stroke Patients Only)       Balance Overall balance assessment: Needs assistance   Sitting balance-Leahy Scale: Fair Sitting balance - Comments: Initial mod-maxA progressing to min guard; tends to prop on R elbow but able to briefly transition self to midline with BUE support                                     Pertinent Vitals/Pain Pain Assessment: Faces Faces Pain Scale: Hurts a little bit Pain Location: Initial guarding/grimacing with R knee PROM Pain Descriptors / Indicators: Grimacing;Guarding Pain Intervention(s): Monitored during session    Home Living Family/patient expects to be discharged to:: Skilled nursing facility                 Additional Comments: Per chart, pt resident at [facility] Pt poor historian    Prior Function Level of Independence: Needs assistance         Comments: Likely assist for all aspects of mobility/ADLs; pt reports he is able to feed self. Significant R knee flexor contracture, likely using w/c     Hand Dominance        Extremity/Trunk Assessment   Upper Extremity Assessment Upper Extremity Assessment: Generalized weakness;Difficult to assess due to impaired cognition    Lower Extremity Assessment Lower Extremity Assessment: RLE deficits/detail;Generalized weakness;Difficult to assess due  to impaired cognition RLE Deficits / Details: H/o R quadriceps tendon repair (09/2019), R knee contracture lacking 41' extension       Communication   Communication: HOH  Cognition Arousal/Alertness: Awake/alert Behavior During Therapy: WFL for tasks assessed/performed Overall Cognitive Status: History of cognitive impairments - at baseline                                 General Comments: H/o dementia. Only oriented to self, not following commands consistently. "I can't do that I'm  tired from working all morning, and I worked all day yesterday." Mostly pleasantly confused, one time saying, "If you do that I might take a swing at you"      General Comments      Exercises Other Exercises Other Exercises: PROM for R knee extension - lacking ~80'   Assessment/Plan    PT Assessment All further PT needs can be met in the next venue of care  PT Problem List Decreased strength;Decreased range of motion;Decreased activity tolerance;Decreased balance;Decreased mobility;Decreased cognition;Decreased safety awareness       PT Treatment Interventions      PT Goals (Current goals can be found in the Care Plan section)  Acute Rehab PT Goals PT Goal Formulation: All assessment and education complete, DC therapy    Frequency     Barriers to discharge        Co-evaluation PT/OT/SLP Co-Evaluation/Treatment: Yes Reason for Co-Treatment: Necessary to address cognition/behavior during functional activity;For patient/therapist safety;To address functional/ADL transfers           AM-PAC PT "6 Clicks" Mobility  Outcome Measure Help needed turning from your back to your side while in a flat bed without using bedrails?: A Lot Help needed moving from lying on your back to sitting on the side of a flat bed without using bedrails?: A Lot Help needed moving to and from a bed to a chair (including a wheelchair)?: Total Help needed standing up from a chair using your arms (e.g., wheelchair or bedside chair)?: Total Help needed to walk in hospital room?: Total Help needed climbing 3-5 steps with a railing? : Total 6 Click Score: 8    End of Session   Activity Tolerance: Patient tolerated treatment well Patient left: in bed;with call bell/phone within reach;with bed alarm set;with nursing/sitter in room Nurse Communication: Mobility status PT Visit Diagnosis: Other abnormalities of gait and mobility (R26.89);Muscle weakness (generalized) (M62.81)    Time: 7408-1448 PT Time  Calculation (min) (ACUTE ONLY): 24 min   Charges:   PT Evaluation $PT Eval Moderate Complexity: 1 Mod     Mabeline Caras, PT, DPT Acute Rehabilitation Services  Pager [phone] Office [place] 03/25/2020, 12:51 PM

## 2020-03-25 NOTE — TOC Initial Note (Addendum)
Transition of Care [facility]) - Initial/Assessment Note    Patient Details  Name: Jason Vasquez MRN: [id number] Date of Birth: 1929/08/17  Transition of Care [facility]) CM/SW Contact:    Sharin Mons, RN Phone Number: [phone] 03/25/2020, 3:59 PM  Clinical Narrative:  Admitted with acute metabolic encephalopathy ? 2/2 UTI. From [facility] SNF. NCM @ bedside with pt and daughter Jason Vasquez. Pt disoriented, memory impairment.  Daughter Jason Vasquez states d/c plan once medically ready dad will return to [facility] SNF/LTC. NCM called [place] admissions to make them aware, voice message left.  Jason Vasquez (Daughter)     [phone]      SNF work up completed . TOC team will continue to monitor for needs ....     03/26/2020 @ 1:00 pm   Pt from [facility] SNF/LTC, daughter Jason Vasquez requested if we would  do search for  potential SNF/LTC  closer to [employer]. NCM made referral to SNFs. Daughter understands  there will be no delays once pt is med. ready  and will d/c back to [facility] and [facility] can  further assist. Daughter is ok with pt's return to [facility] if no SNF/LTC presents to their liking.   Expected Discharge Plan: Skilled Nursing Facility(with LTC) Barriers to Discharge: Continued Medical Work up   Patient Goals and CMS Choice   CMS Medicare.gov Compare Post Acute Care list provided to:: Patient Represenative (must comment)    Expected Discharge Plan and Services Expected Discharge Plan: Skilled Nursing Facility(with LTC)     Post Acute Care Choice: [place] Living arrangements for the past 2 months: [place]                                      Prior Living Arrangements/Services Living arrangements for the past 2 months: [place]                     Activities of Daily Living Home Assistive Devices/Equipment: None ADL Screening (condition at time of admission) Patient's cognitive  ability adequate to safely complete daily activities?: No Is the patient deaf or have difficulty hearing?: No Does the patient have difficulty seeing, even when wearing glasses/contacts?: Yes Does the patient have difficulty concentrating, remembering, or making decisions?: Yes Patient able to express need for assistance with ADLs?: No Does the patient have difficulty dressing or bathing?: Yes Independently performs ADLs?: No Does the patient have difficulty walking or climbing stairs?: Yes Weakness of Legs: Both Weakness of Arms/Hands: Both  Permission Sought/Granted                  Emotional Assessment              Admission diagnosis:  Encephalopathy [G93.40] Acute cystitis without hematuria [N30.00] Altered mental status, unspecified altered mental status type [R41.82] Patient Active Problem List   Diagnosis Date Noted  . Acute lower UTI 03/24/2020  . Acute metabolic encephalopathy 95/18/8416  . Encephalopathy 03/24/2020  . COVID-19 virus infection 09/29/2019  . Delirium 09/28/2019  . CKD (chronic kidney disease), stage III 09/27/2019  . HLD (hyperlipidemia) 09/27/2019  . DM2 (diabetes mellitus, type 2) ([place]) 09/27/2019  . BPH (benign prostatic hyperplasia) 09/27/2019  . Pressure injury of skin 09/27/2019  . Quadriceps muscle rupture, right, initial encounter 09/24/2019  . Quadriceps tendon rupture, right, initial encounter 09/23/2019   PCP:  Jason Vasquez,  Jason Ar, MD Pharmacy:   [facility] [place], [place] - [address] [place] [address] [place] [address] Phone: [phone] Fax: [phone]     Social Determinants of Health (SDOH) Interventions    Readmission Risk Interventions No flowsheet data found.

## 2020-03-25 NOTE — Plan of Care (Signed)
  Problem: Pain Managment: Goal: General experience of comfort will improve Outcome: Progressing   Problem: Safety: Goal: Ability to remain free from injury will improve Outcome: Progressing   Problem: Skin Integrity: Goal: Risk for impaired skin integrity will decrease Outcome: Progressing   

## 2020-03-25 NOTE — Progress Notes (Signed)
Patient attempting to getting out of bed constantly throughout the night and noncompliant with nursing staff  1:1 Safety sitter ordered   Patient removed IV during the night and refused vitals this morning MD aware

## 2020-03-25 NOTE — Evaluation (Signed)
Occupational Therapy Evaluation Patient Details Name: Jason Vasquez MRN: [id number] DOB: 18-Mar-1929 Today's Date: 03/25/2020    History of Present Illness Pt is an 86 y.o. male resident at [facility] admitted 03/23/20 after fall hitting his head, later that day becoming unresponsive with [employer] called by facility; EMS noted lethargy, slurred speech and R-side leaning, afib. Head CT without acute abnormality. Head MRI negative for acute infarct. Pelvic and L shoulder imaging without acute injury. Workup for acute encephalopathy secondary to UTI. PMH includes dementia, OSA, HTN, DM, R quadriceps tendon rupture (s/p repair 09/24/19), COVID+ with [facility] admit.   Clinical Impression   Pt admitted with the above diagnosis and demonstrates the below listed deficits.  He demonstrates significant cognitive deficits due to h/o dementia, which significantly limited his participation.  He would not engage in ADL tasks and required total A for all aspects.  He requires max A +2 for bed mobility. But able to sit EOB with max A progressing to min guard assist.  He is a resident of LTC.  Recommend return to SNF at discharge.  All further needs can be addressed at SNF.  Acute OT will sign off at this time.     Follow Up Recommendations  SNF    Equipment Recommendations  None recommended by OT    Recommendations for Other Services       Precautions / Restrictions Precautions Precautions: Fall;Other (comment) Precaution Comments: Incontinence; safety sitter      Mobility Bed Mobility Overal bed mobility: Needs Assistance Bed Mobility: Supine to Sit;Sit to Supine     Supine to sit: Max assist;+2 for physical assistance Sit to supine: Max assist;+2 for physical assistance   General bed mobility comments: Pt hesitant to mobilize, maxA+2 to elevate trunk and manage BLEs  Transfers                 General transfer comment: Pt adamant against standing    Balance Overall balance assessment:  Needs assistance   Sitting balance-Leahy Scale: Fair Sitting balance - Comments: Initial mod-maxA progressing to min guard; tends to prop on R elbow but able to briefly transition self to midline with BUE support                                   ADL either performed or assessed with clinical judgement   ADL Overall ADL's : Needs assistance/impaired                                       General ADL Comments: Pt would not engage in ADL tasks despite attempts to engage him.       Vision         Perception     Praxis      Pertinent Vitals/Pain Pain Assessment: Faces Faces Pain Scale: Hurts a little bit Pain Location: Initial guarding/grimacing with R knee PROM Pain Descriptors / Indicators: Grimacing;Guarding Pain Intervention(s): Monitored during session     Hand Dominance Right   Extremity/Trunk Assessment Upper Extremity Assessment Upper Extremity Assessment: Generalized weakness   Lower Extremity Assessment Lower Extremity Assessment: Defer to PT evaluation   Cervical / Trunk Assessment Cervical / Trunk Assessment: Kyphotic   Communication Communication Communication: HOH   Cognition Arousal/Alertness: Awake/alert Behavior During Therapy: WFL for tasks assessed/performed(periods of irritability ) Overall Cognitive Status: History of  cognitive impairments - at baseline                                 General Comments: H/o dementia. Only oriented to self, not following commands consistently. "I can't do that I'm tired from working all morning, and I worked all day yesterday." Mostly pleasantly confused, one time saying, "If you do that I might take a swing at you"   General Comments       Exercises     Shoulder Instructions      Home Living Family/patient expects to be discharged to:: Skilled nursing facility                                 Additional Comments: Per chart, pt resident at [facility]. Pt poor historian      Prior Functioning/Environment Level of Independence: Needs assistance        Comments: Likely assist for all aspects of mobility/ADLs; pt reports he is able to feed self. Significant R knee flexor contracture, likely using w/c        OT Problem List: Decreased strength;Decreased activity tolerance;Impaired balance (sitting and/or standing);Decreased safety awareness;Obesity;Decreased cognition      OT Treatment/Interventions:      OT Goals(Current goals can be found in the care plan section) Acute Rehab OT Goals OT Goal Formulation: Patient unable to participate in goal setting  OT Frequency:     Barriers to D/C:            Co-evaluation PT/OT/SLP Co-Evaluation/Treatment: Yes Reason for Co-Treatment: Necessary to address cognition/behavior during functional activity;Complexity of the patient's impairments (multi-system involvement);For patient/therapist safety   OT goals addressed during session: ADL's and self-care;Strengthening/ROM      AM-PAC OT "6 Clicks" Daily Activity     Outcome Measure Help from another person eating meals?: Total Help from another person taking care of personal grooming?: Total Help from another person toileting, which includes using toliet, bedpan, or urinal?: Total Help from another person bathing (including washing, rinsing, drying)?: Total Help from another person to put on and taking off regular upper body clothing?: Total Help from another person to put on and taking off regular lower body clothing?: Total 6 Click Score: 6   End of Session    Activity Tolerance:   Patient left:    OT Visit Diagnosis: Unsteadiness on feet (R26.81);Cognitive communication deficit (R41.841)                Time: 9024-0973 OT Time Calculation (min): 23 min Charges:  OT General Charges $OT Visit: 1 Visit OT Evaluation $OT Eval Moderate Complexity: 1 Mod  Nilsa Nutting., OTR/L Acute Rehabilitation Services Pager  [phone] Office [place], [place] 03/25/2020, 5:03 PM

## 2020-03-25 NOTE — Plan of Care (Signed)

## 2020-03-25 NOTE — Progress Notes (Signed)
Patient moved to a low bed

## 2020-03-25 NOTE — Progress Notes (Signed)
Progress Note    Jason Vasquez  UVO:[id number] DOB: 08-20-1929  DOA: 03/23/2020 PCP: Angelica Pou, MD    Brief Narrative:     Medical records reviewed and are as summarized below:  Jason Vasquez is an 86 y.o. male with a past medical history significant for hypertension, diabetes, dementia, hyperlipidemia, sleep apnea admitted May 12 for acute metabolic encephalopathy likely related to a urinary tract infection.  Some concern for strokelike symptoms upon presentation and MRI was obtained which ruled out infarct.  Evaluated by neurology who agreed that encephalopathy likely related to urinary tract infection.  Assessment/Plan:   Principal Problem:   Acute metabolic encephalopathy Active Problems:   Acute lower UTI   CKD (chronic kidney disease), stage III   DM2 (diabetes mellitus, type 2) (HCC)   HLD (hyperlipidemia)  #1.  Acute metabolic encephalopathy likely related to urinary tract infection in the setting of underlying dementia.  Some improvement today but not at baseline.  CT of the head unremarkable.  EEG suggestive of moderate diffuse encephalopathy, nonspecific etiology, no seizure or epileptiform discharges were seen throughout the recording.  MRI ruled out infarct.  Urine culture reveals pseudomonas aeruginosa, gm posititve cocci. Antibiotics changed to  Cefepime.  He is afebrile hemodynamically stable and nontoxic-appearing -Cefipime per pharmacy -Follow sensitivities -Monitor closely  #2.  Urinary tract infection.  See #1.  Was started on IV Rocephin. -Cefepime per pharmacy -Follow sensitivities  #3.  Chronic kidney disease stage III.  Creatinine at baseline -Hold nephrotoxins as able -Monitor  #4.  Anemia of chronic disease.  Hemoglobin range 9-10.  No sign symptoms of active bleeding. -Monitor  #5.  Diabetes type 2.  Hemoglobin A1c 5.1.  Had an episode of hypoglycemia likely related to poor oral intake. -Sensitive sliding scale -consider extra sensitive  SS -full assist with feeds. Ate well at breakfast  #6.  Hypertension.  Blood pressure slightly elevated.  Home medications include hygroton diltiazem, Avapro, labetalol as needed -Continue home meds -Monitor  7.  Dementia.  Oriented to self only.  Answers questions appropriately but unable to follow simple commands.  Quite irritable. -Delirium precautions    Family Communication/Anticipated D/C date and plan/Code Status   DVT prophylaxis: Lovenox ordered. Code Status: dnr.  Family Communication:  Disposition Plan: Status is: Inpatient  Remains inpatient appropriate because:Inpatient level of care appropriate due to severity of illness   Dispo: The patient is from: SNF              Anticipated d/c is to: SNF              Anticipated d/c date is: 1 day              Patient currently is not medically stable to d/c.          Medical Consultants:    neruology   Anti-Infectives:    Rocephin 5/12-5/13  Cefepime 5/13>>  Subjective:   Sitting up in bed being fed breakfast.  Denies pain but refuses to allow assessment of right leg.  Quickly becomes agitated/irritable during exam  Objective:    Vitals:   03/24/20 1537 03/24/20 1856 03/24/20 1940 03/25/20 0751  BP: (!) 179/71 (!) 176/70 (!) 154/71 (!) 165/92  Pulse: 86 85 81 84  Resp: 18 18 16 15   Temp:  98.2 F (36.8 C) 99.1 F (37.3 C) (!) 97.4 F (36.3 C)  TempSrc:  Oral Oral Oral  SpO2: 100% 100% 94% 91%  Weight:  Height:        Intake/Output Summary (Last 24 hours) at 03/25/2020 1232 Last data filed at 03/25/2020 0900 Gross per 24 hour  Intake 240 ml  Output --  Net 240 ml   Filed Weights   03/23/20 1537  Weight: 84.4 kg    Exam: General: Well-nourished alert no acute distress agitated CV regular rate and rhythm no murmur gallop or rub trace lower extremity edema bilaterally pedal pulses present Respiratory: No increased work of breathing breath sounds are distant but clear I hear no  wheeze no crackles Abdomen: Nondistended soft positive bowel sounds throughout nontender to palpation no guarding or rebounding Musculoskeletal: Joints without swelling/erythema right leg tender to palpation moves all extremities spontaneously Neuro: Alert oriented to self only speech is clear facial symmetry unable to follow simple commands  Data Reviewed:   I have personally reviewed following labs and imaging studies:  Labs: Labs show the following:   Basic Metabolic Panel: Recent Labs  Lab 03/23/20 1515 03/23/20 1515 03/23/20 1520 03/23/20 1520 03/24/20 0744 03/25/20 0846  NA 141  --  141  --  141 140  K 3.9   < > 3.7   < > 4.0 3.7  CL 105  --  102  --  106 104  CO2 25  --   --   --  26 25  GLUCOSE 103*  --  99  --  78 100*  BUN 17  --  17  --  15 16  CREATININE 1.44*  --  1.30*  --  1.44* 1.43*  CALCIUM 9.0  --   --   --  9.1 9.1   < > = values in this interval not displayed.   GFR Estimated Creatinine Clearance: 38.4 mL/min (A) (by C-G formula based on SCr of 1.43 mg/dL (H)). Liver Function Tests: Recent Labs  Lab 03/23/20 1515  AST 28  ALT 17  ALKPHOS 50  BILITOT 0.8  PROT 6.5  ALBUMIN 3.0*   No results for input(s): LIPASE, AMYLASE in the last 168 hours. Recent Labs  Lab 03/23/20 1642  AMMONIA 34   Coagulation profile Recent Labs  Lab 03/23/20 1515  INR 1.1    CBC: Recent Labs  Lab 03/23/20 1515 03/23/20 1520 03/24/20 0744 03/25/20 0846  WBC 5.6  --  4.2 5.9  NEUTROABS 2.6  --  2.1 3.7  HGB 9.9* 10.2* 9.0* 10.6*  HCT 30.2* 30.0* 29.0* 32.0*  MCV 102.0*  --  107.8* 101.3*  PLT 267  --  249 318   Cardiac Enzymes: No results for input(s): CKTOTAL, CKMB, CKMBINDEX, TROPONINI in the last 168 hours. BNP (last 3 results) No results for input(s): PROBNP in the last 8760 hours. CBG: Recent Labs  Lab 03/24/20 1100 03/24/20 1729 03/24/20 2105 03/25/20 0747 03/25/20 1011  GLUCAP 67* 128* 101* 62* 119*   D-Dimer: No results for  input(s): DDIMER in the last 72 hours. Hgb A1c: Recent Labs    03/24/20 0744  HGBA1C 5.1   Lipid Profile: No results for input(s): CHOL, HDL, LDLCALC, TRIG, CHOLHDL, LDLDIRECT in the last 72 hours. Thyroid function studies: Recent Labs    03/23/20 1642  TSH 0.751   Anemia work up: No results for input(s): VITAMINB12, FOLATE, FERRITIN, TIBC, IRON, RETICCTPCT in the last 72 hours. Sepsis Labs: Recent Labs  Lab 03/23/20 1515 03/24/20 0744 03/25/20 0846  WBC 5.6 4.2 5.9    Microbiology Recent Results (from the past 240 hour(s))  Urine culture  Status: Abnormal (Preliminary result)   Collection Time: 03/23/20  7:21 PM   Specimen: Urine, Random  Result Value Ref Range Status   Specimen Description URINE, RANDOM  Final   Special Requests ADDED 2112  Final   Culture (A)  Final    90,000 COLONIES/mL PSEUDOMONAS AERUGINOSA SUSCEPTIBILITIES TO FOLLOW 80,000 COLONIES/mL GRAM POSITIVE COCCI IDENTIFICATION AND SUSCEPTIBILITIES TO FOLLOW Performed at [facility], [address] [place], [place] [address]    Report Status PENDING  Incomplete  SARS Coronavirus 2 by RT PCR (hospital order, performed in [facility] hospital lab) Nasopharyngeal Nasopharyngeal Swab     Status: None   Collection Time: 03/23/20  9:49 PM   Specimen: Nasopharyngeal Swab  Result Value Ref Range Status   SARS Coronavirus 2 NEGATIVE NEGATIVE Final    Comment: (NOTE) SARS-CoV-2 target nucleic acids are NOT DETECTED. The SARS-CoV-2 RNA is generally detectable in upper and lower respiratory specimens during the acute phase of infection. The lowest concentration of SARS-CoV-2 viral copies this assay can detect is 250 copies / mL. A negative result does not preclude SARS-CoV-2 infection and should not be used as the sole basis for treatment or other patient management decisions.  A negative result may occur with improper specimen collection / handling, submission of specimen other than  nasopharyngeal swab, presence of viral mutation(s) within the areas targeted by this assay, and inadequate number of viral copies (<250 copies / mL). A negative result must be combined with clinical observations, patient history, and epidemiological information. Fact Sheet for Patients:   BoilerBrush.com.cyhttps://www.fda.gov/media/136312/download Fact Sheet for Healthcare Providers: https://pope.com/https://www.fda.gov/media/136313/download This test is not yet approved or cleared  by the [place] FDA and has been authorized for detection and/or diagnosis of SARS-CoV-2 by FDA under an Emergency Use Authorization (EUA).  This EUA will remain in effect (meaning this test can be used) for the duration of the COVID-19 declaration under Section 564(b)(1) of the Act, 21 U.S.C. section 360bbb-3(b)(1), unless the authorization is terminated or revoked sooner. Performed at [facility], [address] [place], [place] [address]   Culture, blood (routine x 2)     Status: None (Preliminary result)   Collection Time: 03/24/20  8:40 PM   Specimen: BLOOD  Result Value Ref Range Status   Specimen Description BLOOD RIGHT ANTECUBITAL  Final   Special Requests AEROBIC BOTTLE ONLY Blood Culture adequate volume  Final   Culture   Final    NO GROWTH < 12 HOURS Performed at [facility], [address] [place], [place] [address]    Report Status PENDING  Incomplete  Culture, blood (routine x 2)     Status: None (Preliminary result)   Collection Time: 03/24/20  8:51 PM   Specimen: BLOOD  Result Value Ref Range Status   Specimen Description BLOOD LEFT ANTECUBITAL  Final   Special Requests AEROBIC BOTTLE ONLY Blood Culture adequate volume  Final   Culture   Final    NO GROWTH < 12 HOURS Performed at [facility], [address] [place], [place] [address]    Report Status PENDING  Incomplete    Procedures and diagnostic studies:  EEG  Result Date: 03/24/2020 Charlsie QuestYadav, Priyanka O, MD     03/24/2020  9:15 AM  Patient Name: Jason Vasquez MRN: [id number] Epilepsy Attending: Charlsie QuestPriyanka O Yadav Referring Physician/Provider: Felicie Mornavid Smith, PA Date: 03/23/2020 Duration: 24.58 minutes Patient history: 86 year old male presented with altered mental status and right-sided weakness.  EEG evaluate for  seizures. Level of alertness: Awake, asleep AEDs during EEG study: None Technical aspects: This EEG study was done with scalp electrodes positioned according to the 10-20 International system of electrode placement. Electrical activity was acquired at a sampling rate of 500Hz  and reviewed with a high frequency filter of 70Hz  and a low frequency filter of 1Hz . EEG data were recorded continuously and digitally stored. Description: During awake state, no clear posterior dominant was seen.  Sleep was characterized by vertex waves, sleep spindles (12-14Hz ), maximal frontocentral region.  EEG showed condyle generalized low amplitude 3 to 6 Hz theta delta slowing.  Hyperventilation and photic stimulation were not performed. Single-lead EKG also showed evidence of irregularly irregular heart rhythm. Abnormality -Continuous slow, generalized IMPRESSION: This study is suggestive of moderate diffuse encephalopathy, nonspecific etiology No seizures or epileptiform discharges were seen throughout the recording.   MR BRAIN WO CONTRAST  Result Date: 03/24/2020 CLINICAL DATA:  86 year old male with encephalopathy. History of COVID-19. EXAM: MRI HEAD WITHOUT CONTRAST TECHNIQUE: Multiplanar, multiecho pulse sequences of the brain and surrounding structures were obtained without intravenous contrast. COMPARISON:  Head CT 03/23/2020 and earlier. FINDINGS: The examination had to be discontinued prior to completion due to patient agitation. And only diagnostic coronal DWI imaging could be obtained. No restricted diffusion to suggest acute infarct. Cerebral volume and ventriculomegaly appears stable. No intracranial mass effect is evident.  IMPRESSION: Coronal DWI imaging only due to patient agitation - negative for acute infarct. Electronically Signed   By: 05/24/2020 M.D.   On: 03/24/2020 15:56   DG Pelvis Portable  Result Date: 03/23/2020 CLINICAL DATA:  Fall EXAM: PORTABLE PELVIS 1-2 VIEWS COMPARISON:  None. FINDINGS: No acute bony abnormality. Specifically, no fracture, subluxation, or dislocation. Hip joints are symmetric. IMPRESSION: No acute bony abnormality. Electronically Signed   By: Odessa Fleming M.D.   On: 03/23/2020 16:54   DG Chest Portable 1 View  Result Date: 03/23/2020 CLINICAL DATA:  Fall EXAM: PORTABLE CHEST 1 VIEW COMPARISON:  None. FINDINGS: Mild cardiomegaly. Interstitial prominence within the lungs could reflect interstitial edema. This is improved since prior study. No effusions or pneumothorax. No acute bony abnormality. IMPRESSION: Diffuse interstitial prominence, likely interstitial edema, improved since prior study. Electronically Signed   By: Charlett Nose M.D.   On: 03/23/2020 16:53   DG Shoulder Left Portable  Result Date: 03/23/2020 CLINICAL DATA:  Left shoulder pain. EXAM: LEFT SHOULDER COMPARISON:  None. FINDINGS: There is no evidence of fracture or dislocation. Mild to moderate severity degenerative changes seen involving the left acromioclavicular joint and left acromion. Soft tissues are unremarkable. IMPRESSION: Mild to moderate severity degenerative changes without evidence of acute fracture or dislocation. Electronically Signed   By: Charlett Nose M.D.   On: 03/23/2020 21:41   CT HEAD CODE STROKE WO CONTRAST  Result Date: 03/23/2020 CLINICAL DATA:  Code stroke. Ataxia. Right-sided weakness. Altered mental status. EXAM: CT HEAD WITHOUT CONTRAST TECHNIQUE: Contiguous axial images were obtained from the base of the skull through the vertex without intravenous contrast. COMPARISON:  CT head 09/15/2019 at [facility]. FINDINGS: Brain: Advanced atrophy and diffuse  white matter disease is present. There are new cortical infarct is present. Basal ganglia are stable. Insular cortex is normal. Remote lacunar infarcts are present in the thalami bilaterally. Chronic white matter changes extend into the brainstem. Cerebellum is normal. Vascular: No hyperdense vessel or unexpected calcification. Skull: Calvarium is intact. No focal lytic or blastic lesions are present. No  significant extracranial soft tissue lesion is present. Sinuses/Orbits: The paranasal sinuses and mastoid air cells are clear. The globes and orbits are within normal limits. ASPECTS [facility] Stroke Program Early CT Score) - Ganglionic level infarction (caudate, lentiform nuclei, internal capsule, insula, M1-M3 cortex): 7/7 - Supraganglionic infarction (M4-M6 cortex): 3/3 Total score (0-10 with 10 being normal): 10/10 IMPRESSION: 1. No acute intracranial abnormality or significant interval change. 2. Advanced atrophy and diffuse white matter disease. This likely reflects the sequela of chronic microvascular ischemia. 3. Remote lacunar infarcts of the thalami bilaterally. *ASPECTS is 10/10 * The above was relayed via text pager to Dr. Amada Jupiter on 03/23/2020 at 15:37 . Electronically Signed   By: Marin Roberts M.D.   On: 03/23/2020 15:37    Medications:   . chlorthalidone  50 mg Oral Daily  . citalopram  10 mg Oral Daily  . diltiazem  420 mg Oral Daily  . docusate sodium  100 mg Oral BID  . enoxaparin (LOVENOX) injection  40 mg Subcutaneous Daily  . famotidine  10 mg Oral BID  . finasteride  5 mg Oral QHS  . fluticasone  1 spray Each Nare Daily  . insulin aspart  0-9 Units Subcutaneous TID WC  . irbesartan  300 mg Oral Daily  . sodium chloride flush  3 mL Intravenous Q12H  . traZODone  25 mg Oral QHS   Continuous Infusions: . sodium chloride    . ceFEPime (MAXIPIME) IV 1 g (03/25/20 1022)     LOS: 1 day   Gwenyth Bender NP [facility]   How to contact the [facility] Attending or  Consulting provider 7A - 7P or covering provider during after hours 7P -7A, for this patient?  1. Check the care team in [facility] and look for a) attending/consulting [facility] provider listed and b) the [facility] team listed 2. Log into [email or web address] and use [facility] universal password to access. If you do not have the password, please contact the hospital operator. 3. Locate the [facility] provider you are looking for under [facility] and page to a number that you can be directly reached. 4. If you still have difficulty reaching the provider, please page the [facility] (Director on Call) for the Hospitalists listed on amion for assistance.  03/25/2020, 12:32 PM

## 2020-03-25 NOTE — Progress Notes (Signed)
SLP Cancellation Note  Patient Details Name: Caydence Enck MRN: [id number] DOB: 1929/01/15   Cancelled treatment:       Reason Eval/Treat Not Completed: SLP screened, no needs identified, will sign off. Pt tolerating diet, CVA ruled out. MD says no need for eval.    Jasraj Lappe, Riley Nearing 03/25/2020, 10:39 AM

## 2020-03-25 NOTE — [place] FL2 (Signed)
[place] MEDICAID FL2 LEVEL OF CARE SCREENING TOOL     IDENTIFICATION  Patient Name: Jason Vasquez Birthdate: [birth year] Sex: male Admission Date (Current Location): 03/23/2020  [facility] and [place] Number:  Herbalist and Address:  The [place], [place] [address], [place], [place] [address]      Provider Number: [id number]  Attending Physician Name and Address:  Alma Friendly, MD  Relative Name and Phone Number:  Baldwin Crown [phone] daughter    Current Level of Care: Hospital Recommended Level of Care: [place] Prior Approval Number:    Date Approved/Denied:   PASRR Number: [id number]  Discharge Plan:      Current Diagnoses: Patient Active Problem List   Diagnosis Date Noted  . Acute lower UTI 03/24/2020  . Acute metabolic encephalopathy 80/99/8338  . Encephalopathy 03/24/2020  . COVID-19 virus infection 09/29/2019  . Delirium 09/28/2019  . CKD (chronic kidney disease), stage III 09/27/2019  . HLD (hyperlipidemia) 09/27/2019  . DM2 (diabetes mellitus, type 2) ([place]) 09/27/2019  . BPH (benign prostatic hyperplasia) 09/27/2019  . Pressure injury of skin 09/27/2019  . Quadriceps muscle rupture, right, initial encounter 09/24/2019  . Quadriceps tendon rupture, right, initial encounter 09/23/2019    Orientation RESPIRATION BLADDER Height & Weight     Self(memory impairment)  Normal Continent Weight: 84.4 kg Height:  6' (182.9 cm)  BEHAVIORAL SYMPTOMS/MOOD NEUROLOGICAL BOWEL NUTRITION STATUS      Continent Diet(refer to d/c summary)  AMBULATORY STATUS COMMUNICATION OF NEEDS Skin   Extensive Assist Verbally Normal, Skin abrasions(Abrasion R knee)                       Personal Care Assistance Level of Assistance  Bathing, Feeding, Dressing Bathing Assistance: Maximum assistance Feeding assistance: Limited assistance Dressing Assistance: Maximum assistance     Functional Limitations Info  Sight,  Hearing, Speech Sight Info: Adequate Hearing Info: Adequate Speech Info: Adequate    SPECIAL CARE FACTORS FREQUENCY  PT (By licensed PT), OT (By licensed OT)     PT Frequency: 5 x / week, evaluate and treat OT Frequency: 5 x / week, evaluate and treat            Contractures Contractures Info: Not present    Additional Factors Info  Code Status, Allergies Code Status Info: DNR Allergies Info: Ace Inhibitors, Ciprofloxacin, Levofloxacin, Metformin, Neomy-bacit-polymyx-pramoxine, Tramadol           Current Medications (03/25/2020):  This is the current hospital active medication list Current Facility-Administered Medications  Medication Dose Route Frequency Provider Last Rate Last Admin  . 0.9 %  sodium chloride infusion  250 mL Intravenous PRN Norins, Heinz Knuckles, MD      . acetaminophen (TYLENOL) tablet 650 mg  650 mg Oral Q8H PRN [name], MD   650 mg at 03/25/20 0951  . ceFEPIme (MAXIPIME) 1 g in sodium chloride 0.9 % 100 mL IVPB  1 g Intravenous Q24H Alma Friendly, MD 200 mL/hr at 03/25/20 1022 1 g at 03/25/20 1022  . chlorthalidone (HYGROTON) tablet 50 mg  50 mg Oral Daily Norins, Heinz Knuckles, MD   50 mg at 03/24/20 1726  . citalopram (CELEXA) tablet 10 mg  10 mg Oral Daily Norins, Heinz Knuckles, MD   10 mg at 03/25/20 0953  . diltiazem (CARDIZEM CD) 24 hr capsule 420 mg  420 mg Oral Daily Wyvonnia Dusky, MD   420 mg at 03/25/20 2505  .  docusate sodium (COLACE) capsule 100 mg  100 mg Oral BID Jacques Navy, MD   100 mg at 03/25/20 0953  . enoxaparin (LOVENOX) injection 40 mg  40 mg Subcutaneous Daily Norins, Rosalyn Gess, MD   40 mg at 03/25/20 0958  . famotidine (PEPCID) tablet 10 mg  10 mg Oral BID Jacques Navy, MD   10 mg at 03/25/20 0955  . finasteride (PROSCAR) tablet 5 mg  5 mg Oral QHS Norins, Rosalyn Gess, MD   5 mg at 03/24/20 2217  . fluticasone (FLONASE) 50 MCG/ACT nasal spray 1 spray  1 spray Each Nare Daily Norins, Rosalyn Gess, MD      .  hydrocortisone cream 1 %   Topical TID PRN Briant Cedar, MD   Given at 03/24/20 2222  . irbesartan (AVAPRO) tablet 300 mg  300 mg Oral Daily Norins, Rosalyn Gess, MD   300 mg at 03/24/20 1723  . labetalol (NORMODYNE) injection 10 mg  10 mg Intravenous Q2H PRN Opyd, Lavone Neri, MD      . sodium chloride flush (NS) 0.9 % injection 3 mL  3 mL Intravenous Q12H Norins, Rosalyn Gess, MD   3 mL at 03/24/20 1724  . sodium chloride flush (NS) 0.9 % injection 3 mL  3 mL Intravenous PRN Norins, Rosalyn Gess, MD      . traZODone (DESYREL) tablet 25 mg  25 mg Oral QHS Norins, Rosalyn Gess, MD   25 mg at 03/24/20 2217     Discharge Medications: Please see discharge summary for a list of discharge medications.  Relevant Imaging Results:  Relevant Lab Results:   Additional Information OJ#[id number]  Epifanio Lesches, RN

## 2020-03-26 LAB — BASIC METABOLIC PANEL
Anion gap: 9 (ref 5–15)
BUN: 14 mg/dL (ref 8–23)
CO2: 23 mmol/L (ref 22–32)
Calcium: 8.7 mg/dL — ABNORMAL LOW (ref 8.9–10.3)
Chloride: 105 mmol/L (ref 98–111)
Creatinine, Ser: 1.39 mg/dL — ABNORMAL HIGH (ref 0.61–1.24)
GFR calc Af Amer: 52 mL/min — ABNORMAL LOW (ref >60)
GFR calc non Af Amer: 45 mL/min — ABNORMAL LOW (ref >60)
Glucose, Bld: 117 mg/dL — ABNORMAL HIGH (ref 70–99)
Potassium: 3.7 mmol/L (ref 3.5–5.1)
Sodium: 137 mmol/L (ref 135–145)

## 2020-03-26 LAB — CBC WITH DIFFERENTIAL/PLATELET
Abs Immature Granulocytes: 0.01 10*3/uL (ref 0.00–0.07)
Basophils Absolute: 0 10*3/uL (ref 0.0–0.1)
Basophils Relative: 0 %
Eosinophils Absolute: 0.1 10*3/uL (ref 0.0–0.5)
Eosinophils Relative: 3 %
HCT: 27.7 % — ABNORMAL LOW (ref 39.0–52.0)
Hemoglobin: 9.3 g/dL — ABNORMAL LOW (ref 13.0–17.0)
Immature Granulocytes: 0 %
Lymphocytes Relative: 20 %
Lymphs Abs: 0.9 10*3/uL (ref 0.7–4.0)
MCH: 33.5 pg (ref 26.0–34.0)
MCHC: 33.6 g/dL (ref 30.0–36.0)
MCV: 99.6 fL (ref 80.0–100.0)
Monocytes Absolute: 0.3 10*3/uL (ref 0.1–1.0)
Monocytes Relative: 6 %
Neutro Abs: 3.4 10*3/uL (ref 1.7–7.7)
Neutrophils Relative %: 71 %
Platelets: 269 10*3/uL (ref 150–400)
RBC: 2.78 MIL/uL — ABNORMAL LOW (ref 4.22–5.81)
RDW: 13 % (ref 11.5–15.5)
WBC: 4.7 10*3/uL (ref 4.0–10.5)
nRBC: 0 % (ref 0.0–0.2)

## 2020-03-26 LAB — GLUCOSE, CAPILLARY
Glucose-Capillary: 82 mg/dL (ref 70–99)
Glucose-Capillary: 108 mg/dL — ABNORMAL HIGH (ref 70–99)
Glucose-Capillary: 124 mg/dL — ABNORMAL HIGH (ref 70–99)
Glucose-Capillary: 80 mg/dL (ref 70–99)

## 2020-03-26 MED ORDER — FOSFOMYCIN TROMETHAMINE 3 G PO PACK
3.0000 g | PACK | ORAL | Status: DC
  Administered 2020-03-26: 3 g via ORAL
  Filled 2020-03-26 (×3): qty 3

## 2020-03-26 NOTE — Progress Notes (Signed)
PROGRESS NOTE  Jason Vasquez OTL:[id number] DOB: [birth year] DOA: 03/23/2020 PCP: Miguel Aschoff, MD  HPI/Recap of past 24 hours: HPI from Dr Debby Bud Jamion Carter is a 86 y.o. male with medical history significant of sleep apnea, hypertension, hyperlipidemia, diabetes and dementia. Patient resides at [facility]. PTA, patient did have a fall and hit his head, later he was in the family area with family when he suddenly became unresponsive and was laying on the floor. At that time CODE BLUE was called by the facility. He slowly came around and EMS was called. Nurse at [facility] states his blood pressure at that time was 198/72, he showed no seizure like activity. On arrival EMS noted patient was very lethargic, has slurred speech and was leaning to the right. In route patient did vomit. He was noticed to be in atrial fibrillation in route. He is not on any anticoagulations. In the ED, patient was noted to be lethargic, slow to respond. He was brought immediately to CT to evaluate for possible subdural hematoma and/or stroke. CT did not show any intracranial abnormalities. Lab revealed Cr 1.3, nl BUN, Troponin #1 52, #2 47, WBC 5.6, Hgb 10.2 nl diff. TSH 0.751. U/A with mod LE, few bacteria, > 50 WBC/hpf. Abx were started in ED and urine sent for culture. Neuro did see patient in ED with recommnedation for MRI brain, MRA head/neck. [facility] called to admit patient for treatment UTI and mgt of encephalopathy.       Today, patient denies any new complaints, noted to be somewhat calm, listening to music.     Assessment/Plan: Principal Problem:   Acute metabolic encephalopathy Active Problems:   CKD (chronic kidney disease), stage III   HLD (hyperlipidemia)   DM2 (diabetes mellitus, type 2) (HCC)   Acute lower UTI  Acute metabolic encephalopathy Underlying history of dementia Likely 2/2 UTI as noted below Currently afebrile, with no leukocytosis Chest x-ray showed diffuse interstitial  prominence, likely interstitial edema although improved since prior study CT head unremarkable EEG suggestive of moderate diffuse encephalopathy, nonspecific etiology, no seizure or epileptiform discharges were seen throughout the recording MRI brain although with motion artifact, no evidence of acute intracranial abnormality Neurology on board, unlikely CVA, no further recommendations, signed off Fall precautions, monitor closely  Pseudomonas aeruginosa/E faecalis UTI UA showed moderate leukocytes, few bacteria, greater than 50 WBC UC 90,000 Pseudomonas aeruginosa and 90,000 E faecalis  BC x2 NGTD Continue IV cefepime, pharmacy recommended fosfomycin x2 doses  CKD stage IIIa Creatinine at baseline Daily BMP  Anemia of chronic kidney disease Hemoglobin currently slightly trended down No obvious signs of bleeding Daily CBC  Diabetes mellitus type 2 Last A1c 5.1 on 03/24/2020, well-controlled Episode of hypoglycemia on 03/24/2020, poor oral intake Discontinued SSI due to hypoglycemia, Accu-Cheks, hypoglycemic protocol Regular diet for now  Hypertension Continue chlorthalidone, diltiazem, Avapro, labetalol as needed  Dementia Delirium precautions      Malnutrition Type:      Malnutrition Characteristics:      Nutrition Interventions:       Estimated body mass index is 25.23 kg/m as calculated from the following:   Height as of this encounter: 6' (1.829 m).   Weight as of this encounter: 84.4 kg.     Code Status: DNR  Family Communication: Plan to discuss with family  Disposition Plan: Status is: Inpatient     Remains inpatient appropriate because:Altered mental status, requiring IV antibiotics    Dispo: The patient is from: SNF  Anticipated d/c is to: SNF              Anticipated d/c date is: 2 days              Patient currently is not medically stable to d/c.due to requiring IV antibiotics     Consultants:  Neurology  Procedures:  None  Antimicrobials:  Cefepime  Fosfomycin  DVT prophylaxis: Lovenox   Objective: Vitals:   03/25/20 1446 03/25/20 1900 03/26/20 0534 03/26/20 0720  BP: (!) 127/59 (!) 148/70 (!) 158/76 (!) 159/82  Pulse: 63 60 73 65  Resp: 16 15 16 17   Temp: 97.8 F (36.6 C) 97.9 F (36.6 C) 98.6 F (37 C) 97.7 F (36.5 C)  TempSrc: Oral Oral Oral Oral  SpO2: 94% 95% 91% 94%  Weight:      Height:        Intake/Output Summary (Last 24 hours) at 03/26/2020 1443 Last data filed at 03/26/2020 1210 Gross per 24 hour  Intake 462 ml  Output 550 ml  Net -88 ml   Filed Weights   03/23/20 1537  Weight: 84.4 kg    Exam:  General: NAD, disoriented, alert, unable to follow commands  Cardiovascular: S1, S2 present  Respiratory: CTAB  Abdomen: Soft, nontender, nondistended, bowel sounds present  Musculoskeletal: No bilateral pedal edema noted  Skin: Normal  Psychiatry: Normal mood   Data Reviewed: CBC: Recent Labs  Lab 03/23/20 1515 03/23/20 1520 03/24/20 0744 03/25/20 0846 03/26/20 0746  WBC 5.6  --  4.2 5.9 4.7  NEUTROABS 2.6  --  2.1 3.7 3.4  HGB 9.9* 10.2* 9.0* 10.6* 9.3*  HCT 30.2* 30.0* 29.0* 32.0* 27.7*  MCV 102.0*  --  107.8* 101.3* 99.6  PLT 267  --  249 318 016   Basic Metabolic Panel: Recent Labs  Lab 03/23/20 1515 03/23/20 1520 03/24/20 0744 03/25/20 0846 03/26/20 0746  NA 141 141 141 140 137  K 3.9 3.7 4.0 3.7 3.7  CL 105 102 106 104 105  CO2 25  --  26 25 23   GLUCOSE 103* 99 78 100* 117*  BUN 17 17 15 16 14   CREATININE 1.44* 1.30* 1.44* 1.43* 1.39*  CALCIUM 9.0  --  9.1 9.1 8.7*   GFR: Estimated Creatinine Clearance: 39.5 mL/min (A) (by C-G formula based on SCr of 1.39 mg/dL (H)). Liver Function Tests: Recent Labs  Lab 03/23/20 1515  AST 28  ALT 17  ALKPHOS 50  BILITOT 0.8  PROT 6.5  ALBUMIN 3.0*   No results for input(s): LIPASE, AMYLASE in the last 168 hours. Recent Labs  Lab  03/23/20 1642  AMMONIA 34   Coagulation Profile: Recent Labs  Lab 03/23/20 1515  INR 1.1   Cardiac Enzymes: No results for input(s): CKTOTAL, CKMB, CKMBINDEX, TROPONINI in the last 168 hours. BNP (last 3 results) No results for input(s): PROBNP in the last 8760 hours. HbA1C: Recent Labs    03/24/20 0744  HGBA1C 5.1   CBG: Recent Labs  Lab 03/25/20 0747 03/25/20 1011 03/25/20 2044 03/26/20 0723 03/26/20 1156  GLUCAP 62* 119* 123* 82 108*   Lipid Profile: No results for input(s): CHOL, HDL, LDLCALC, TRIG, CHOLHDL, LDLDIRECT in the last 72 hours. Thyroid Function Tests: Recent Labs    03/23/20 1642  TSH 0.751   Anemia Panel: No results for input(s): VITAMINB12, FOLATE, FERRITIN, TIBC, IRON, RETICCTPCT in the last 72 hours. Urine analysis:    Component Value Date/Time   COLORURINE YELLOW 03/23/2020 1900   APPEARANCEUR  CLEAR 03/23/2020 1900   LABSPEC 1.012 03/23/2020 1900   PHURINE 5.0 03/23/2020 1900   GLUCOSEU NEGATIVE 03/23/2020 1900   HGBUR NEGATIVE 03/23/2020 1900   BILIRUBINUR NEGATIVE 03/23/2020 1900   KETONESUR NEGATIVE 03/23/2020 1900   PROTEINUR NEGATIVE 03/23/2020 1900   NITRITE NEGATIVE 03/23/2020 1900   LEUKOCYTESUR MODERATE (A) 03/23/2020 1900   Sepsis Labs: @LABRCNTIP (procalcitonin:4,lacticidven:4)  ) Recent Results (from the past 240 hour(s))  Urine culture     Status: Abnormal   Collection Time: 03/23/20  7:21 PM   Specimen: Urine, Random  Result Value Ref Range Status   Specimen Description URINE, RANDOM  Final   Special Requests   Final    ADDED 2112 Performed at [facility], [address], [place], [place]    Culture (A)  Final    90,000 COLONIES/mL PSEUDOMONAS AERUGINOSA 80,000 COLONIES/mL ENTEROCOCCUS FAECALIS    Report Status 03/25/2020 FINAL  Final   Organism ID, Bacteria PSEUDOMONAS AERUGINOSA (A)  Final   Organism ID, Bacteria ENTEROCOCCUS FAECALIS (A)  Final      Susceptibility   Enterococcus faecalis  - MIC*    AMPICILLIN <=2 SENSITIVE Sensitive     NITROFURANTOIN <=16 SENSITIVE Sensitive     VANCOMYCIN 1 SENSITIVE Sensitive     * 80,000 COLONIES/mL ENTEROCOCCUS FAECALIS   Pseudomonas aeruginosa - MIC*    CEFTAZIDIME 4 SENSITIVE Sensitive     CIPROFLOXACIN <=0.25 SENSITIVE Sensitive     GENTAMICIN 2 SENSITIVE Sensitive     IMIPENEM 2 SENSITIVE Sensitive     PIP/TAZO 8 SENSITIVE Sensitive     CEFEPIME 4 SENSITIVE Sensitive     * 90,000 COLONIES/mL PSEUDOMONAS AERUGINOSA  SARS Coronavirus 2 by RT PCR (hospital order, performed in [facility] hospital lab) Nasopharyngeal Nasopharyngeal Swab     Status: None   Collection Time: 03/23/20  9:49 PM   Specimen: Nasopharyngeal Swab  Result Value Ref Range Status   SARS Coronavirus 2 NEGATIVE NEGATIVE Final    Comment: (NOTE) SARS-CoV-2 target nucleic acids are NOT DETECTED. The SARS-CoV-2 RNA is generally detectable in upper and lower respiratory specimens during the acute phase of infection. The lowest concentration of SARS-CoV-2 viral copies this assay can detect is 250 copies / mL. A negative result does not preclude SARS-CoV-2 infection and should not be used as the sole basis for treatment or other patient management decisions.  A negative result may occur with improper specimen collection / handling, submission of specimen other than nasopharyngeal swab, presence of viral mutation(s) within the areas targeted by this assay, and inadequate number of viral copies (<250 copies / mL). A negative result must be combined with clinical observations, patient history, and epidemiological information. Fact Sheet for Patients:   05/23/20 Fact Sheet for Healthcare Providers: BoilerBrush.com.cy This test is not yet approved or cleared  by the https://pope.com/ FDA and has been authorized for detection and/or diagnosis of SARS-CoV-2 by FDA under an Emergency Use Authorization (EUA).  This EUA  will remain in effect (meaning this test can be used) for the duration of the COVID-19 declaration under Section 564(b)(1) of the Act, 21 U.S.C. section 360bbb-3(b)(1), unless the authorization is terminated or revoked sooner. Performed at [facility], [address], [place], [place]   Culture, blood (routine x 2)     Status: None (Preliminary result)   Collection Time: 03/24/20  8:40 PM   Specimen: BLOOD  Result Value Ref Range Status   Specimen Description BLOOD RIGHT ANTECUBITAL  Final   Special Requests AEROBIC BOTTLE ONLY Blood Culture adequate volume  Final   Culture   Final    NO GROWTH 2 DAYS Performed at [facility], [address], [place], [place] [address]    Report Status PENDING  Incomplete  Culture, blood (routine x 2)     Status: None (Preliminary result)   Collection Time: 03/24/20  8:51 PM   Specimen: BLOOD  Result Value Ref Range Status   Specimen Description BLOOD LEFT ANTECUBITAL  Final   Special Requests AEROBIC BOTTLE ONLY Blood Culture adequate volume  Final   Culture   Final    NO GROWTH 2 DAYS Performed at [facility], [address] [place], [place] [address]    Report Status PENDING  Incomplete      Studies: No results found.  Scheduled Meds: . chlorthalidone  50 mg Oral Daily  . citalopram  10 mg Oral Daily  . diltiazem  420 mg Oral Daily  . docusate sodium  100 mg Oral BID  . enoxaparin (LOVENOX) injection  40 mg Subcutaneous Daily  . famotidine  10 mg Oral BID  . finasteride  5 mg Oral QHS  . fluticasone  1 spray Each Nare Daily  . fosfomycin  3 g Oral Q72H  . irbesartan  300 mg Oral Daily  . sodium chloride flush  3 mL Intravenous Q12H  . traZODone  25 mg Oral QHS    Continuous Infusions: . sodium chloride    . ceFEPime (MAXIPIME) IV 1 g (03/26/20 1043)     LOS: 2 days     Briant Cedar, MD [facility]  If 7PM-7AM, please contact night-coverage [email or web address] 03/26/2020, 2:43  PM

## 2020-03-26 NOTE — Plan of Care (Signed)
  Problem: Education: Goal: Knowledge of General Education information will improve Description: Including pain rating scale, medication(s)/side effects and non-pharmacologic comfort measures Outcome: Progressing   Problem: Pain Managment: Goal: General experience of comfort will improve Outcome: Progressing   Problem: Safety: Goal: Ability to remain free from injury will improve Outcome: Progressing   

## 2020-03-27 LAB — CBC WITH DIFFERENTIAL/PLATELET
Abs Immature Granulocytes: 0.03 10*3/uL (ref 0.00–0.07)
Basophils Absolute: 0 10*3/uL (ref 0.0–0.1)
Basophils Relative: 1 %
Eosinophils Absolute: 0.2 10*3/uL (ref 0.0–0.5)
Eosinophils Relative: 3 %
HCT: 33.5 % — ABNORMAL LOW (ref 39.0–52.0)
Hemoglobin: 11.4 g/dL — ABNORMAL LOW (ref 13.0–17.0)
Immature Granulocytes: 1 %
Lymphocytes Relative: 26 %
Lymphs Abs: 1.7 10*3/uL (ref 0.7–4.0)
MCH: 33.6 pg (ref 26.0–34.0)
MCHC: 34 g/dL (ref 30.0–36.0)
MCV: 98.8 fL (ref 80.0–100.0)
Monocytes Absolute: 0.5 10*3/uL (ref 0.1–1.0)
Monocytes Relative: 8 %
Neutro Abs: 4 10*3/uL (ref 1.7–7.7)
Neutrophils Relative %: 61 %
Platelets: 307 10*3/uL (ref 150–400)
RBC: 3.39 MIL/uL — ABNORMAL LOW (ref 4.22–5.81)
RDW: 13.1 % (ref 11.5–15.5)
WBC: 6.3 10*3/uL (ref 4.0–10.5)
nRBC: 0 % (ref 0.0–0.2)

## 2020-03-27 LAB — BASIC METABOLIC PANEL
Anion gap: 12 (ref 5–15)
BUN: 13 mg/dL (ref 8–23)
CO2: 24 mmol/L (ref 22–32)
Calcium: 9.3 mg/dL (ref 8.9–10.3)
Chloride: 103 mmol/L (ref 98–111)
Creatinine, Ser: 1.39 mg/dL — ABNORMAL HIGH (ref 0.61–1.24)
GFR calc Af Amer: 52 mL/min — ABNORMAL LOW (ref >60)
GFR calc non Af Amer: 45 mL/min — ABNORMAL LOW (ref >60)
Glucose, Bld: 93 mg/dL (ref 70–99)
Potassium: 4.3 mmol/L (ref 3.5–5.1)
Sodium: 139 mmol/L (ref 135–145)

## 2020-03-27 LAB — GLUCOSE, CAPILLARY
Glucose-Capillary: 94 mg/dL (ref 70–99)
Glucose-Capillary: 88 mg/dL (ref 70–99)
Glucose-Capillary: 85 mg/dL (ref 70–99)
Glucose-Capillary: 88 mg/dL (ref 70–99)

## 2020-03-27 MED ORDER — SODIUM CHLORIDE 0.9 % IV SOLN
2.0000 g | INTRAVENOUS | Status: DC
  Administered 2020-03-28 – 2020-03-29 (×2): 2 g via INTRAVENOUS
  Filled 2020-03-27 (×2): qty 2

## 2020-03-27 NOTE — Progress Notes (Signed)
PROGRESS NOTE  Micael Barb FHL:[id number] DOB: 27-Mar-1929 DOA: 03/23/2020 PCP: Angelica Pou, MD  HPI/Recap of past 24 hours: HPI from Dr Linda Hedges Hildreth Robart is a 86 y.o. male with medical history significant of sleep apnea, hypertension, hyperlipidemia, diabetes and dementia. Patient resides at [facility]. PTA, patient did have a fall and hit his head, later he was in the family area with family when he suddenly became unresponsive and was laying on the floor. At that time CODE BLUE was called by the facility. He slowly came around and EMS was called. Nurse at [facility] states his blood pressure at that time was 198/72, he showed no seizure like activity. On arrival EMS noted patient was very lethargic, has slurred speech and was leaning to the right. In route patient did vomit. He was noticed to be in atrial fibrillation in route. He is not on any anticoagulations. In the ED, patient was noted to be lethargic, slow to respond. He was brought immediately to CT to evaluate for possible subdural hematoma and/or stroke. CT did not show any intracranial abnormalities. Lab revealed Cr 1.3, nl BUN, Troponin #1 52, #2 47, WBC 5.6, Hgb 10.2 nl diff. TSH 0.751. U/A with mod LE, few bacteria, > 50 WBC/hpf. Abx were started in ED and urine sent for culture. Neuro did see patient in ED with recommnedation for MRI brain, MRA head/neck. [facility] called to admit patient for treatment UTI and mgt of encephalopathy.       Today, met patient resting in bed, easily arousable.  Noted to be intermittently combative with RN/NT during attempts to feed, or give medications.  Patient denies any pain.     Assessment/Plan: Principal Problem:   Acute metabolic encephalopathy Active Problems:   CKD (chronic kidney disease), stage III   HLD (hyperlipidemia)   DM2 (diabetes mellitus, type 2) (HCC)   Acute lower UTI  Acute metabolic encephalopathy Underlying history of dementia Likely 2/2 UTI as noted  below Currently afebrile, with no leukocytosis Chest x-ray showed diffuse interstitial prominence, likely interstitial edema although improved since prior study CT head unremarkable EEG suggestive of moderate diffuse encephalopathy, nonspecific etiology, no seizure or epileptiform discharges were seen throughout the recording MRI brain although with motion artifact, no evidence of acute intracranial abnormality Neurology on board, unlikely CVA, no further recommendations, signed off Fall precautions, monitor closely  Pseudomonas aeruginosa/E faecalis UTI UA showed moderate leukocytes, few bacteria, greater than 50 WBC UC 90,000 Pseudomonas aeruginosa and 90,000 E faecalis  BC x2 NGTD Continue IV cefepime, pharmacy recommended fosfomycin x2 doses  CKD stage IIIa Creatinine at baseline Daily BMP  Anemia of chronic kidney disease Hemoglobin currently slightly trended down No obvious signs of bleeding Daily CBC  Diabetes mellitus type 2 Last A1c 5.1 on 03/24/2020, well-controlled Episode of hypoglycemia on 03/24/2020, poor oral intake Discontinued SSI due to hypoglycemia, Accu-Cheks, hypoglycemic protocol Regular diet for now  Hypertension Continue chlorthalidone, diltiazem, Avapro, labetalol as needed  Dementia Delirium precautions      Malnutrition Type:      Malnutrition Characteristics:      Nutrition Interventions:       Estimated body mass index is 25.23 kg/m as calculated from the following:   Height as of this encounter: 6' (1.829 m).   Weight as of this encounter: 84.4 kg.     Code Status: DNR  Family Communication: Plan to discuss with family  Disposition Plan: Status is: Inpatient     Remains inpatient appropriate because:Altered mental status,  requiring IV antibiotics   Dispo: The patient is from: SNF              Anticipated d/c is to: SNF              Anticipated d/c date is: 2 days              Patient currently is not medically stable  to d/c.due to requiring IV antibiotics    Consultants:  Neurology  Procedures:  None  Antimicrobials:  Cefepime  Fosfomycin  DVT prophylaxis: Lovenox   Objective: Vitals:   03/26/20 0534 03/26/20 0720 03/27/20 0500 03/27/20 0749  BP: (!) 158/76 (!) 159/82 (!) 131/99 132/87  Pulse: 73 65 95 87  Resp: 16 17 20 17   Temp: 98.6 F (37 C) 97.7 F (36.5 C) 98.9 F (37.2 C) 98.8 F (37.1 C)  TempSrc: Oral Oral Axillary Axillary  SpO2: 91% 94% 98% 99%  Weight:      Height:        Intake/Output Summary (Last 24 hours) at 03/27/2020 1319 Last data filed at 03/27/2020 0900 Gross per 24 hour  Intake 445.3 ml  Output --  Net 445.3 ml   Filed Weights   03/23/20 1537  Weight: 84.4 kg    Exam:  General: NAD, disoriented, alert, unable to follow commands  Cardiovascular: S1, S2 present  Respiratory: CTAB  Abdomen: Soft, nontender, nondistended, bowel sounds present  Musculoskeletal: No bilateral pedal edema noted  Skin: Normal  Psychiatry:  Intermittently combative   Data Reviewed: CBC: Recent Labs  Lab 03/23/20 1515 03/23/20 1515 03/23/20 1520 03/24/20 0744 03/25/20 0846 03/26/20 0746 03/27/20 0439  WBC 5.6  --   --  4.2 5.9 4.7 6.3  NEUTROABS 2.6  --   --  2.1 3.7 3.4 4.0  HGB 9.9*   < > 10.2* 9.0* 10.6* 9.3* 11.4*  HCT 30.2*   < > 30.0* 29.0* 32.0* 27.7* 33.5*  MCV 102.0*  --   --  107.8* 101.3* 99.6 98.8  PLT 267  --   --  249 318 269 307   < > = values in this interval not displayed.   Basic Metabolic Panel: Recent Labs  Lab 03/23/20 1515 03/23/20 1515 03/23/20 1520 03/24/20 0744 03/25/20 0846 03/26/20 0746 03/27/20 0439  NA 141   < > 141 141 140 137 139  K 3.9   < > 3.7 4.0 3.7 3.7 4.3  CL 105   < > 102 106 104 105 103  CO2 25  --   --  26 25 23 24   GLUCOSE 103*   < > 99 78 100* 117* 93  BUN 17   < > 17 15 16 14 13   CREATININE 1.44*   < > 1.30* 1.44* 1.43* 1.39* 1.39*  CALCIUM 9.0  --   --  9.1 9.1 8.7* 9.3   < > = values in  this interval not displayed.   GFR: Estimated Creatinine Clearance: 39.5 mL/min (A) (by C-G formula based on SCr of 1.39 mg/dL (H)). Liver Function Tests: Recent Labs  Lab 03/23/20 1515  AST 28  ALT 17  ALKPHOS 50  BILITOT 0.8  PROT 6.5  ALBUMIN 3.0*   No results for input(s): LIPASE, AMYLASE in the last 168 hours. Recent Labs  Lab 03/23/20 1642  AMMONIA 34   Coagulation Profile: Recent Labs  Lab 03/23/20 1515  INR 1.1   Cardiac Enzymes: No results for input(s): CKTOTAL, CKMB, CKMBINDEX, TROPONINI in the last 168 hours.  BNP (last 3 results) No results for input(s): PROBNP in the last 8760 hours. HbA1C: No results for input(s): HGBA1C in the last 72 hours. CBG: Recent Labs  Lab 03/26/20 1156 03/26/20 1639 03/26/20 2124 03/27/20 0746 03/27/20 1203  GLUCAP 108* 124* 80 94 88   Lipid Profile: No results for input(s): CHOL, HDL, LDLCALC, TRIG, CHOLHDL, LDLDIRECT in the last 72 hours. Thyroid Function Tests: No results for input(s): TSH, T4TOTAL, FREET4, T3FREE, THYROIDAB in the last 72 hours. Anemia Panel: No results for input(s): VITAMINB12, FOLATE, FERRITIN, TIBC, IRON, RETICCTPCT in the last 72 hours. Urine analysis:    Component Value Date/Time   COLORURINE YELLOW 03/23/2020 1900   APPEARANCEUR CLEAR 03/23/2020 1900   LABSPEC 1.012 03/23/2020 1900   PHURINE 5.0 03/23/2020 1900   GLUCOSEU NEGATIVE 03/23/2020 1900   HGBUR NEGATIVE 03/23/2020 1900   BILIRUBINUR NEGATIVE 03/23/2020 1900   KETONESUR NEGATIVE 03/23/2020 1900   PROTEINUR NEGATIVE 03/23/2020 1900   NITRITE NEGATIVE 03/23/2020 1900   LEUKOCYTESUR MODERATE (A) 03/23/2020 1900   Sepsis Labs: '@LABRCNTIP'$ (procalcitonin:4,lacticidven:4)  ) Recent Results (from the past 240 hour(s))  Urine culture     Status: Abnormal   Collection Time: 03/23/20  7:21 PM   Specimen: Urine, Random  Result Value Ref Range Status   Specimen Description URINE, RANDOM  Final   Special Requests   Final    ADDED  2112 Performed at [facility], [place] [address], [place], [place] [address]    Culture (A)  Final    90,000 COLONIES/mL PSEUDOMONAS AERUGINOSA 80,000 COLONIES/mL ENTEROCOCCUS FAECALIS    Report Status 03/25/2020 FINAL  Final   Organism ID, Bacteria PSEUDOMONAS AERUGINOSA (A)  Final   Organism ID, Bacteria ENTEROCOCCUS FAECALIS (A)  Final      Susceptibility   Enterococcus faecalis - MIC*    AMPICILLIN <=2 SENSITIVE Sensitive     NITROFURANTOIN <=16 SENSITIVE Sensitive     VANCOMYCIN 1 SENSITIVE Sensitive     * 80,000 COLONIES/mL ENTEROCOCCUS FAECALIS   Pseudomonas aeruginosa - MIC*    CEFTAZIDIME 4 SENSITIVE Sensitive     CIPROFLOXACIN <=0.25 SENSITIVE Sensitive     GENTAMICIN 2 SENSITIVE Sensitive     IMIPENEM 2 SENSITIVE Sensitive     PIP/TAZO 8 SENSITIVE Sensitive     CEFEPIME 4 SENSITIVE Sensitive     * 90,000 COLONIES/mL PSEUDOMONAS AERUGINOSA  SARS Coronavirus 2 by RT PCR (hospital order, performed in [place] hospital lab) Nasopharyngeal Nasopharyngeal Swab     Status: None   Collection Time: 03/23/20  9:49 PM   Specimen: Nasopharyngeal Swab  Result Value Ref Range Status   SARS Coronavirus 2 NEGATIVE NEGATIVE Final    Comment: (NOTE) SARS-CoV-2 target nucleic acids are NOT DETECTED. The SARS-CoV-2 RNA is generally detectable in upper and lower respiratory specimens during the acute phase of infection. The lowest concentration of SARS-CoV-2 viral copies this assay can detect is 250 copies / mL. A negative result does not preclude SARS-CoV-2 infection and should not be used as the sole basis for treatment or other patient management decisions.  A negative result may occur with improper specimen collection / handling, submission of specimen other than nasopharyngeal swab, presence of viral mutation(s) within the areas targeted by this assay, and inadequate number of viral copies (<250 copies / mL). A negative result must be combined with clinical observations,  patient history, and epidemiological information. Fact Sheet for Patients:   StrictlyIdeas.no Fact Sheet for Healthcare Providers: BankingDealers.co.za This test is not yet approved or  cleared  by the [place] and has been authorized for detection and/or diagnosis of SARS-CoV-2 by FDA under an Emergency Use Authorization (EUA).  This EUA will remain in effect (meaning this test can be used) for the duration of the COVID-19 declaration under Section 564(b)(1) of the Act, 21 U.S.C. section 360bbb-3(b)(1), unless the authorization is terminated or revoked sooner. Performed at [facility], [place] [address], [place], [place] [address]   Culture, blood (routine x 2)     Status: None (Preliminary result)   Collection Time: 03/24/20  8:40 PM   Specimen: BLOOD  Result Value Ref Range Status   Specimen Description BLOOD RIGHT ANTECUBITAL  Final   Special Requests AEROBIC BOTTLE ONLY Blood Culture adequate volume  Final   Culture   Final    NO GROWTH 3 DAYS Performed at [facility], [place] [address], [place], [place] [address]    Report Status PENDING  Incomplete  Culture, blood (routine x 2)     Status: None (Preliminary result)   Collection Time: 03/24/20  8:51 PM   Specimen: BLOOD  Result Value Ref Range Status   Specimen Description BLOOD LEFT ANTECUBITAL  Final   Special Requests AEROBIC BOTTLE ONLY Blood Culture adequate volume  Final   Culture   Final    NO GROWTH 3 DAYS Performed at [facility], [place] [address], [place], [place] [address]    Report Status PENDING  Incomplete      Studies: No results found.  Scheduled Meds: . chlorthalidone  50 mg Oral Daily  . citalopram  10 mg Oral Daily  . diltiazem  420 mg Oral Daily  . docusate sodium  100 mg Oral BID  . enoxaparin (LOVENOX) injection  40 mg Subcutaneous Daily  . famotidine  10 mg Oral BID  . finasteride  5 mg Oral QHS  . fluticasone  1 spray  Each Nare Daily  . fosfomycin  3 g Oral Q72H  . irbesartan  300 mg Oral Daily  . sodium chloride flush  3 mL Intravenous Q12H  . traZODone  25 mg Oral QHS    Continuous Infusions: . sodium chloride    . [START ON 03/28/2020] ceFEPime (MAXIPIME) IV       LOS: 3 days     Alma Friendly, MD [facility]  If 7PM-7AM, please contact night-coverage [email or web address] 03/27/2020, 1:19 PM

## 2020-03-27 NOTE — Progress Notes (Signed)
Pt has been combative whenever this writer or NT tried to feed pt;take his CBG; giving meds.Pt tries to punch;swing his arm,attempts to bite when staff tries to do the aforementioned tasks.

## 2020-03-27 NOTE — Progress Notes (Signed)
Pharmacy Antibiotic Note  Jason Vasquez is a 86 y.o. male admitted on 03/23/2020 with acute metabolic encephalopathy, found to have UTI with pansensitive Pseudomonas and E. faecalis. Patient has rash allergy to ciprofloxacin and levofloxacin. Pharmacy has been consulted for Cefepime dosing.  Day 3 abx for UTI. Patient remains afebrile, WBC wnl. Renal function improved, SCr 1.39, CrCl = 40 mL/min.   Plan: Increase to cefepime 2 gm IV q24h F/u clinical improvement and LOT  Height: 6' (182.9 cm) Weight: 84.4 kg (186 lb) IBW/kg (Calculated) : 77.6  Temp (24hrs), Avg:98.9 F (37.2 C), Min:98.8 F (37.1 C), Max:98.9 F (37.2 C)  Recent Labs  Lab 03/23/20 1515 03/23/20 1515 03/23/20 1520 03/24/20 0744 03/25/20 0846 03/26/20 0746 03/27/20 0439  WBC 5.6  --   --  4.2 5.9 4.7 6.3  CREATININE 1.44*   < > 1.30* 1.44* 1.43* 1.39* 1.39*   < > = values in this interval not displayed.    Estimated Creatinine Clearance: 39.5 mL/min (A) (by C-G formula based on SCr of 1.39 mg/dL (H)).    Allergies  Allergen Reactions  . Ace Inhibitors Cough  . Ciprofloxacin Rash    Other reaction(s): RASH   . Levofloxacin Rash    Other reaction(s): RASH   . Metformin Diarrhea    Other reaction(s): DIARRHEA   . Neomy-Bacit-Polymyx-Pramoxine Rash    Patient is allergic to all antibiotics except Doxycycline Patient is allergic to all antibiotics except Doxycycline   . Tramadol Nausea Only    Other reaction(s): NAUSEA    Antimicrobials this admission: 5/11 CTX x 1 5/13 cefepime >> 5/14 fosfomycin q72h x2  Microbiology this admission: 5/11 UCx: 90K Pseudomonas, 80K E. faecalis, both pansensitive 5/12 BCx: NG3D  Lulu Riding, PharmD PGY1 Pharmacy Resident  Please check AMION for all [facility] phone numbers After 10:00 PM, call Main Pharmacy [phone]  03/27/2020 11:30 AM

## 2020-03-28 LAB — BASIC METABOLIC PANEL
Anion gap: 14 (ref 5–15)
BUN: 20 mg/dL (ref 8–23)
CO2: 22 mmol/L (ref 22–32)
Calcium: 9 mg/dL (ref 8.9–10.3)
Chloride: 102 mmol/L (ref 98–111)
Creatinine, Ser: 1.42 mg/dL — ABNORMAL HIGH (ref 0.61–1.24)
GFR calc Af Amer: 50 mL/min — ABNORMAL LOW (ref >60)
GFR calc non Af Amer: 43 mL/min — ABNORMAL LOW (ref >60)
Glucose, Bld: 88 mg/dL (ref 70–99)
Potassium: 3.7 mmol/L (ref 3.5–5.1)
Sodium: 138 mmol/L (ref 135–145)

## 2020-03-28 LAB — CBC WITH DIFFERENTIAL/PLATELET
Abs Immature Granulocytes: 0.02 10*3/uL (ref 0.00–0.07)
Basophils Absolute: 0 10*3/uL (ref 0.0–0.1)
Basophils Relative: 0 %
Eosinophils Absolute: 0.2 10*3/uL (ref 0.0–0.5)
Eosinophils Relative: 3 %
HCT: 33.3 % — ABNORMAL LOW (ref 39.0–52.0)
Hemoglobin: 11.5 g/dL — ABNORMAL LOW (ref 13.0–17.0)
Immature Granulocytes: 0 %
Lymphocytes Relative: 21 %
Lymphs Abs: 1.6 10*3/uL (ref 0.7–4.0)
MCH: 33.7 pg (ref 26.0–34.0)
MCHC: 34.5 g/dL (ref 30.0–36.0)
MCV: 97.7 fL (ref 80.0–100.0)
Monocytes Absolute: 0.7 10*3/uL (ref 0.1–1.0)
Monocytes Relative: 8 %
Neutro Abs: 5.4 10*3/uL (ref 1.7–7.7)
Neutrophils Relative %: 68 %
Platelets: 287 10*3/uL (ref 150–400)
RBC: 3.41 MIL/uL — ABNORMAL LOW (ref 4.22–5.81)
RDW: 12.9 % (ref 11.5–15.5)
WBC: 7.9 10*3/uL (ref 4.0–10.5)
nRBC: 0 % (ref 0.0–0.2)

## 2020-03-28 LAB — GLUCOSE, CAPILLARY
Glucose-Capillary: 78 mg/dL (ref 70–99)
Glucose-Capillary: 79 mg/dL (ref 70–99)
Glucose-Capillary: 87 mg/dL (ref 70–99)
Glucose-Capillary: 75 mg/dL (ref 70–99)

## 2020-03-28 MED ORDER — DIPHENHYDRAMINE HCL 50 MG/ML IJ SOLN
12.5000 mg | Freq: Four times a day (QID) | INTRAMUSCULAR | Status: DC | PRN
  Administered 2020-03-28 – 2020-03-30 (×3): 12.5 mg via INTRAVENOUS
  Filled 2020-03-28 (×4): qty 1

## 2020-03-28 MED ORDER — QUETIAPINE FUMARATE 25 MG PO TABS
25.0000 mg | ORAL_TABLET | Freq: Every day | ORAL | Status: DC
  Administered 2020-03-28: 25 mg via ORAL
  Filled 2020-03-28: qty 1

## 2020-03-28 NOTE — Progress Notes (Signed)
PROGRESS NOTE  Jason Vasquez DUK:[id number] DOB: [birth year] DOA: 03/23/2020 PCP: Miguel Aschoff, MD  HPI/Recap of past 24 hours: HPI from Dr Debby Bud Delio Slates is a 86 y.o. male with medical history significant of sleep apnea, hypertension, hyperlipidemia, diabetes and dementia. Patient resides at [facility]. PTA, patient did have a fall and hit his head, later he was in the family area with family when he suddenly became unresponsive and was laying on the floor. At that time [facility] was called by the facility. He slowly came around and EMS was called. Nurse at [facility] states his blood pressure at that time was 198/72, he showed no seizure like activity. On arrival EMS noted patient was very lethargic, has slurred speech and was leaning to the right. In route patient did vomit. He was noticed to be in atrial fibrillation in route. He is not on any anticoagulations. In the ED, patient was noted to be lethargic, slow to respond. He was brought immediately to CT to evaluate for possible subdural hematoma and/or stroke. CT did not show any intracranial abnormalities. Lab revealed Cr 1.3, nl BUN, Troponin #1 52, #2 47, WBC 5.6, Hgb 10.2 nl diff. TSH 0.751. U/A with mod LE, few bacteria, > 50 WBC/hpf. Abx were started in ED and urine sent for culture. Neuro did see patient in ED with recommnedation for MRI brain, MRA head/neck. TRH called to admit patient for treatment UTI and mgt of encephalopathy.       Today, patient noted to be continually itching all over, refusing to eat, not communicating, combative/aggressive, swinging towards staff members.  Advised family members to always be present in the room to assist patient in feeding etc, as it would be beneficial for familial voice to be around with patient in the hospital.     Assessment/Plan: Principal Problem:   Acute metabolic encephalopathy Active Problems:   CKD (chronic kidney disease), stage III   HLD (hyperlipidemia)  DM2 (diabetes mellitus, type 2) (HCC)   Acute lower UTI  Acute metabolic encephalopathy Underlying history of dementia Likely 2/2 UTI as noted below Currently afebrile, with no leukocytosis Chest x-ray showed diffuse interstitial prominence, likely interstitial edema although improved since prior study CT head unremarkable EEG suggestive of moderate diffuse encephalopathy, nonspecific etiology, no seizure or epileptiform discharges were seen throughout the recording MRI brain although with motion artifact, no evidence of acute intracranial abnormality Neurology on board, unlikely CVA, no further recommendations, signed off Fall precautions, monitor closely  Pseudomonas aeruginosa/E faecalis UTI UA showed moderate leukocytes, few bacteria, greater than 50 WBC UC 90,000 Pseudomonas aeruginosa and 90,000 E faecalis  BC x2 NGTD Continue IV cefepime, pharmacy recommended fosfomycin x2 doses  CKD stage IIIa Creatinine at baseline Daily BMP  Anemia of chronic kidney disease Hemoglobin currently slightly trended down No obvious signs of bleeding Daily CBC  Diabetes mellitus type 2 Last A1c 5.1 on 03/24/2020, well-controlled Episode of hypoglycemia on 03/24/2020, poor oral intake Discontinued SSI due to hypoglycemia, Accu-Cheks, hypoglycemic protocol Regular diet for now  Hypertension Continue chlorthalidone, diltiazem, Avapro, labetalol as needed  Dementia Delirium precautions-start Seroquel As needed Benadryl for itching      Malnutrition Type:      Malnutrition Characteristics:      Nutrition Interventions:       Estimated body mass index is 25.23 kg/m as calculated from the following:   Height as of this encounter: 6' (1.829 m).   Weight as of this encounter: 84.4 kg.  Code Status: DNR  Family Communication: Discussed with sisters over the phone on 5/16, and at bedside on 5/17  Disposition Plan: Status is: Inpatient     Remains inpatient  appropriate because:Altered mental status, requiring IV antibiotics   Dispo: The patient is from: SNF              Anticipated d/c is to: SNF              Anticipated d/c date is: 2 days              Patient currently is not medically stable to d/c.due to requiring IV antibiotics, still with encephalopathy Vs delirium   Consultants:  Neurology  Procedures:  None  Antimicrobials:  Cefepime  Fosfomycin  DVT prophylaxis: Lovenox   Objective: Vitals:   03/27/20 1445 03/27/20 2122 03/28/20 0450 03/28/20 0732  BP: 130/80 (!) 158/94 (!) 152/98 (!) 134/91  Pulse: 70 73 86 91  Resp: 17 17 18 18   Temp: 98 F (36.7 C) 98.7 F (37.1 C) 99.1 F (37.3 C) 98.8 F (37.1 C)  TempSrc: Axillary Oral Oral Oral  SpO2: 99% 99% 95% 98%  Weight:      Height:        Intake/Output Summary (Last 24 hours) at 03/28/2020 1610 Last data filed at 03/28/2020 0900 Gross per 24 hour  Intake 0 ml  Output --  Net 0 ml   Filed Weights   03/23/20 1537  Weight: 84.4 kg    Exam:  General: NAD, disoriented, unable to follow commands, combative  Cardiovascular: S1, S2 present  Respiratory: CTAB  Abdomen: Soft, nontender, nondistended, bowel sounds present  Musculoskeletal: No bilateral pedal edema noted  Skin: Normal  Psychiatry:  Intermittently combative   Data Reviewed: CBC: Recent Labs  Lab 03/24/20 0744 03/25/20 0846 03/26/20 0746 03/27/20 0439 03/28/20 0218  WBC 4.2 5.9 4.7 6.3 7.9  NEUTROABS 2.1 3.7 3.4 4.0 5.4  HGB 9.0* 10.6* 9.3* 11.4* 11.5*  HCT 29.0* 32.0* 27.7* 33.5* 33.3*  MCV 107.8* 101.3* 99.6 98.8 97.7  PLT 249 318 269 307 287   Basic Metabolic Panel: Recent Labs  Lab 03/24/20 0744 03/25/20 0846 03/26/20 0746 03/27/20 0439 03/28/20 0218  NA 141 140 137 139 138  K 4.0 3.7 3.7 4.3 3.7  CL 106 104 105 103 102  CO2 26 25 23 24 22   GLUCOSE 78 100* 117* 93 88  BUN 15 16 14 13 20   CREATININE 1.44* 1.43* 1.39* 1.39* 1.42*  CALCIUM 9.1 9.1 8.7* 9.3  9.0   GFR: Estimated Creatinine Clearance: 38.7 mL/min (A) (by C-G formula based on SCr of 1.42 mg/dL (H)). Liver Function Tests: Recent Labs  Lab 03/23/20 1515  AST 28  ALT 17  ALKPHOS 50  BILITOT 0.8  PROT 6.5  ALBUMIN 3.0*   No results for input(s): LIPASE, AMYLASE in the last 168 hours. Recent Labs  Lab 03/23/20 1642  AMMONIA 34   Coagulation Profile: Recent Labs  Lab 03/23/20 1515  INR 1.1   Cardiac Enzymes: No results for input(s): CKTOTAL, CKMB, CKMBINDEX, TROPONINI in the last 168 hours. BNP (last 3 results) No results for input(s): PROBNP in the last 8760 hours. HbA1C: No results for input(s): HGBA1C in the last 72 hours. CBG: Recent Labs  Lab 03/27/20 1203 03/27/20 1637 03/27/20 2117 03/28/20 0747 03/28/20 1126  GLUCAP 88 85 88 78 79   Lipid Profile: No results for input(s): CHOL, HDL, LDLCALC, TRIG, CHOLHDL, LDLDIRECT in the last 72 hours. Thyroid  Function Tests: No results for input(s): TSH, T4TOTAL, FREET4, T3FREE, THYROIDAB in the last 72 hours. Anemia Panel: No results for input(s): VITAMINB12, FOLATE, FERRITIN, TIBC, IRON, RETICCTPCT in the last 72 hours. Urine analysis:    Component Value Date/Time   COLORURINE YELLOW 03/23/2020 1900   APPEARANCEUR CLEAR 03/23/2020 1900   LABSPEC 1.012 03/23/2020 1900   PHURINE 5.0 03/23/2020 1900   GLUCOSEU NEGATIVE 03/23/2020 1900   HGBUR NEGATIVE 03/23/2020 1900   BILIRUBINUR NEGATIVE 03/23/2020 1900   KETONESUR NEGATIVE 03/23/2020 1900   PROTEINUR NEGATIVE 03/23/2020 1900   NITRITE NEGATIVE 03/23/2020 1900   LEUKOCYTESUR MODERATE (A) 03/23/2020 1900   Sepsis Labs: @LABRCNTIP (procalcitonin:4,lacticidven:4)  ) Recent Results (from the past 240 hour(s))  Urine culture     Status: Abnormal   Collection Time: 03/23/20  7:21 PM   Specimen: Urine, Random  Result Value Ref Range Status   Specimen Description URINE, RANDOM  Final   Special Requests   Final    ADDED 2112 Performed at [facility], [address], [place], [place] [address]    Culture (A)  Final    90,000 COLONIES/mL PSEUDOMONAS AERUGINOSA 80,000 COLONIES/mL ENTEROCOCCUS FAECALIS    Report Status 03/25/2020 FINAL  Final   Organism ID, Bacteria PSEUDOMONAS AERUGINOSA (A)  Final   Organism ID, Bacteria ENTEROCOCCUS FAECALIS (A)  Final      Susceptibility   Enterococcus faecalis - MIC*    AMPICILLIN <=2 SENSITIVE Sensitive     NITROFURANTOIN <=16 SENSITIVE Sensitive     VANCOMYCIN 1 SENSITIVE Sensitive     * 80,000 COLONIES/mL ENTEROCOCCUS FAECALIS   Pseudomonas aeruginosa - MIC*    CEFTAZIDIME 4 SENSITIVE Sensitive     CIPROFLOXACIN <=0.25 SENSITIVE Sensitive     GENTAMICIN 2 SENSITIVE Sensitive     IMIPENEM 2 SENSITIVE Sensitive     PIP/TAZO 8 SENSITIVE Sensitive     CEFEPIME 4 SENSITIVE Sensitive     * 90,000 COLONIES/mL PSEUDOMONAS AERUGINOSA  SARS Coronavirus 2 by RT PCR (hospital order, performed in [place] hospital lab) Nasopharyngeal Nasopharyngeal Swab     Status: None   Collection Time: 03/23/20  9:49 PM   Specimen: Nasopharyngeal Swab  Result Value Ref Range Status   SARS Coronavirus 2 NEGATIVE NEGATIVE Final    Comment: (NOTE) SARS-CoV-2 target nucleic acids are NOT DETECTED. The SARS-CoV-2 RNA is generally detectable in upper and lower respiratory specimens during the acute phase of infection. The lowest concentration of SARS-CoV-2 viral copies this assay can detect is 250 copies / mL. A negative result does not preclude SARS-CoV-2 infection and should not be used as the sole basis for treatment or other patient management decisions.  A negative result may occur with improper specimen collection / handling, submission of specimen other than nasopharyngeal swab, presence of viral mutation(s) within the areas targeted by this assay, and inadequate number of viral copies (<250 copies / mL). A negative result must be combined with clinical observations, patient history, and  epidemiological information. Fact Sheet for Patients:   StrictlyIdeas.no Fact Sheet for Healthcare Providers: BankingDealers.co.za This test is not yet approved or cleared  by the [place] FDA and has been authorized for detection and/or diagnosis of SARS-CoV-2 by FDA under an Emergency Use Authorization (EUA).  This EUA will remain in effect (meaning this test can be used) for the duration of the COVID-19 declaration under Section 564(b)(1) of the Act, 21 U.S.C. section 360bbb-3(b)(1), unless the authorization is terminated or revoked sooner. Performed at [facility]  Hospital Lab, [address], [place], [place] [address]   Culture, blood (routine x 2)     Status: None (Preliminary result)   Collection Time: 03/24/20  8:40 PM   Specimen: BLOOD  Result Value Ref Range Status   Specimen Description BLOOD RIGHT ANTECUBITAL  Final   Special Requests AEROBIC BOTTLE ONLY Blood Culture adequate volume  Final   Culture   Final    NO GROWTH 4 DAYS Performed at [facility], [address] [place], [place] [address]    Report Status PENDING  Incomplete  Culture, blood (routine x 2)     Status: None (Preliminary result)   Collection Time: 03/24/20  8:51 PM   Specimen: BLOOD  Result Value Ref Range Status   Specimen Description BLOOD LEFT ANTECUBITAL  Final   Special Requests AEROBIC BOTTLE ONLY Blood Culture adequate volume  Final   Culture   Final    NO GROWTH 4 DAYS Performed at [facility], [address] [place], [place] [address]    Report Status PENDING  Incomplete      Studies: No results found.  Scheduled Meds: . chlorthalidone  50 mg Oral Daily  . citalopram  10 mg Oral Daily  . diltiazem  420 mg Oral Daily  . docusate sodium  100 mg Oral BID  . enoxaparin (LOVENOX) injection  40 mg Subcutaneous Daily  . famotidine  10 mg Oral BID  . finasteride  5 mg Oral QHS  . fluticasone  1 spray Each Nare Daily  .  fosfomycin  3 g Oral Q72H  . irbesartan  300 mg Oral Daily  . sodium chloride flush  3 mL Intravenous Q12H  . traZODone  25 mg Oral QHS    Continuous Infusions: . sodium chloride    . ceFEPime (MAXIPIME) IV 2 g (03/28/20 0824)     LOS: 4 days     Briant Cedar, MD [facility]  If 7PM-7AM, please contact night-coverage [email or web address] 03/28/2020, 4:10 PM

## 2020-03-28 NOTE — Plan of Care (Signed)

## 2020-03-28 NOTE — TOC Progression Note (Signed)
Transition of Care [facility]) - Progression Note    Patient Details  Name: Jason Vasquez MRN: [id number] Date of Birth: 1928-12-30  Transition of Care [facility]) CM/SW Contact  Patrice [place], [place] Phone Number: [phone] 03/28/2020, 11:28 AM  Clinical Narrative:     CSW spoke with daughter Cammie Mcgee about the bed offers. Cammie Mcgee stated that she wanted a facility closer to [facility] however did not want patient to go to [facility]. CSW explained again that if no choice was made that he would return to [facility] and she was fine with that him returning if needed. Cammie Mcgee stated that she would like to be alerted if any other [place] bed offers comes back.  TOC team will continue to follow for discharge planning needs.  Expected Discharge Plan: Skilled Nursing Facility(with LTC) Barriers to Discharge: Continued Medical Work up  Expected Discharge Plan and Services Expected Discharge Plan: Skilled Nursing Facility(with LTC)     Post Acute Care Choice: Skilled Nursing Facility Living arrangements for the past 2 months: Skilled Nursing Facility                                       Social Determinants of Health (SDOH) Interventions    Readmission Risk Interventions No flowsheet data found.

## 2020-03-29 ENCOUNTER — Inpatient Hospital Stay (HOSPITAL_COMMUNITY): Payer: Medicare Other

## 2020-03-29 ENCOUNTER — Encounter (HOSPITAL_COMMUNITY): Payer: Self-pay | Admitting: Internal Medicine

## 2020-03-29 DIAGNOSIS — Z515 Encounter for palliative care: Secondary | ICD-10-CM

## 2020-03-29 LAB — CULTURE, BLOOD (ROUTINE X 2)
Culture: NO GROWTH
Culture: NO GROWTH
Special Requests: ADEQUATE
Special Requests: ADEQUATE

## 2020-03-29 LAB — CBC WITH DIFFERENTIAL/PLATELET
Abs Immature Granulocytes: 0.02 10*3/uL (ref 0.00–0.07)
Basophils Absolute: 0 10*3/uL (ref 0.0–0.1)
Basophils Relative: 0 %
Eosinophils Absolute: 0.3 10*3/uL (ref 0.0–0.5)
Eosinophils Relative: 4 %
HCT: 30.4 % — ABNORMAL LOW (ref 39.0–52.0)
Hemoglobin: 10.6 g/dL — ABNORMAL LOW (ref 13.0–17.0)
Immature Granulocytes: 0 %
Lymphocytes Relative: 18 %
Lymphs Abs: 1.2 10*3/uL (ref 0.7–4.0)
MCH: 33.4 pg (ref 26.0–34.0)
MCHC: 34.9 g/dL (ref 30.0–36.0)
MCV: 95.9 fL (ref 80.0–100.0)
Monocytes Absolute: 0.5 10*3/uL (ref 0.1–1.0)
Monocytes Relative: 8 %
Neutro Abs: 4.7 10*3/uL (ref 1.7–7.7)
Neutrophils Relative %: 70 %
Platelets: 272 10*3/uL (ref 150–400)
RBC: 3.17 MIL/uL — ABNORMAL LOW (ref 4.22–5.81)
RDW: 12.8 % (ref 11.5–15.5)
WBC: 6.7 10*3/uL (ref 4.0–10.5)
nRBC: 0 % (ref 0.0–0.2)

## 2020-03-29 LAB — VITAMIN B12: Vitamin B-12: 922 pg/mL — ABNORMAL HIGH (ref 180–914)

## 2020-03-29 LAB — BASIC METABOLIC PANEL
Anion gap: 10 (ref 5–15)
BUN: 23 mg/dL (ref 8–23)
CO2: 25 mmol/L (ref 22–32)
Calcium: 9 mg/dL (ref 8.9–10.3)
Chloride: 105 mmol/L (ref 98–111)
Creatinine, Ser: 1.35 mg/dL — ABNORMAL HIGH (ref 0.61–1.24)
GFR calc Af Amer: 54 mL/min — ABNORMAL LOW (ref >60)
GFR calc non Af Amer: 46 mL/min — ABNORMAL LOW (ref >60)
Glucose, Bld: 93 mg/dL (ref 70–99)
Potassium: 3.4 mmol/L — ABNORMAL LOW (ref 3.5–5.1)
Sodium: 140 mmol/L (ref 135–145)

## 2020-03-29 LAB — RPR: RPR Ser Ql: NONREACTIVE

## 2020-03-29 LAB — GLUCOSE, CAPILLARY
Glucose-Capillary: 79 mg/dL (ref 70–99)
Glucose-Capillary: 84 mg/dL (ref 70–99)
Glucose-Capillary: 83 mg/dL (ref 70–99)
Glucose-Capillary: 81 mg/dL (ref 70–99)

## 2020-03-29 LAB — IRON AND TIBC
Iron: 21 ug/dL — ABNORMAL LOW (ref 45–182)
Saturation Ratios: 12 % — ABNORMAL LOW (ref 17.9–39.5)
TIBC: 174 ug/dL — ABNORMAL LOW (ref 250–450)
UIBC: 153 ug/dL

## 2020-03-29 LAB — AMMONIA: Ammonia: 22 umol/L (ref 9–35)

## 2020-03-29 LAB — FERRITIN: Ferritin: 256 ng/mL (ref 24–336)

## 2020-03-29 LAB — FOLATE: Folate: 7.2 ng/mL (ref >5.9)

## 2020-03-29 MED ORDER — POTASSIUM CHLORIDE CRYS ER 20 MEQ PO TBCR
40.0000 meq | EXTENDED_RELEASE_TABLET | Freq: Once | ORAL | Status: DC
  Filled 2020-03-29: qty 2

## 2020-03-29 MED ORDER — FERROUS SULFATE 325 (65 FE) MG PO TABS: 325.0000 mg | ORAL_TABLET | Freq: Every day | ORAL | Status: DC

## 2020-03-29 MED ORDER — SODIUM CHLORIDE 0.9 % IV SOLN: INTRAVENOUS | Status: AC

## 2020-03-29 NOTE — Progress Notes (Signed)
Dr. Sharolyn Douglas notified via secure epic message in regard to patient being unable to take po medication. Given order to hold po medicine.

## 2020-03-29 NOTE — Plan of Care (Signed)

## 2020-03-29 NOTE — Progress Notes (Signed)
Patient refusing meds, food, and drink during the night.  AM blood sugar 79

## 2020-03-29 NOTE — Progress Notes (Signed)
Radiology tech present: having difficulty with bilateral knee films.  She is able to perform chest xray.  Aurelio Brash, NP notified of this and is ok with this.

## 2020-03-29 NOTE — Progress Notes (Addendum)
Progress Note    Jason Vasquez  TDD:[id number] DOB: 1929/06/02  DOA: 03/23/2020 PCP: Miguel Aschoff, MD    Brief Narrative:    Medical records reviewed and are as summarized below:  Jason Vasquez is an 86 y.o. male with a past medical history significant for hypertension, hyperlipidemia, diabetes, sleep apnea, dementia residing at [facility] prior to admission was brought to the emergency department after having a fall and hitting his head.  Reportedly he was later found unresponsive on the floor.  At that time [facility] was called by the facility.  He came around and EMS was called.  Reportedly at [facility] blood pressure was 198/72 no noted seizure-like activity.  EMS noted patient was lethargic and slurred speech leaning to the right.  They also noted some emesis in route.  EKG showed A. fib while being transported.  No history of anticoagulation.  In the emergency department noted to be lethargic slow to respond underwent CT for possible subdural hematoma and/or stroke.  CT did not show any intracranial abnormalities.  Lab revealed creatinine 1.3, normal BUN, troponin #152, #247, WBCs 5.6, hemoglobin 10.2 with a normal differential.  TSH 0.751.  Urinalysis with moderate leukocytes, few bacteria greater than 50 WBCs.  Antibiotics were started in the ED and urine sent for culture.  He was also evaluated by neurology who recommended MRI of the brain MRA of the head neck.  [facility] called for admission for treatment of urinary tract infection and management of encephalopathy.  Today patient is quite lethargic.  Chart review indicates he had a dose of Benadryl yesterday.  He is afebrile hemodynamically stable moving all extremities.  Assessment/Plan:   Principal Problem:   Acute metabolic encephalopathy Active Problems:   Acute lower UTI   CKD (chronic kidney disease), stage III   DM2 (diabetes mellitus, type 2) (HCC)   HLD (hyperlipidemia)  Acute metabolic encephalopathy Underlying  history of dementia Likely 2/2 UTI as noted below Remains afebrile, with no leukocytosis.  Ammonia level within the limits of normal Chest x-ray showed diffuse interstitial prominence, likely interstitial edema although improved since prior study CT head unremarkable EEG suggestive of moderate diffuse encephalopathy, nonspecific etiology, no seizure or epileptiform discharges were seen throughout the recording MRI brain although with motion artifact, no evidence of acute intracranial abnormality Neurology on board, unlikely CVA, no further recommendations, signed off Fall precautions, monitor closely Suspect today's lethargy related to lingering Benadryl dose from yesterday vs pain vs aspirations. Will discontinue benadryl for now.  If itching becomes a problem again will consider at a lower dose and add Sarna. Obtain chest xray and bilateral knee xray as well as speech eval when able. Provide gentle IV fluids as po intake minimal.  monitor  Pseudomonas aeruginosa/E faecalis UTI UA showed moderate leukocytes, few bacteria, greater than 50 WBC UC 90,000 Pseudomonas aeruginosa and 90,000 E faecalis  BC x2 NGTD Continue IV cefepime, pharmacy recommended fosfomycin x2 doses, last dose fosfomycin due today.  CKD stage IIIa Creatinine remains stable at baseline Daily BMP  Anemia of chronic kidney disease Hemoglobin continues to trend down slightly  Anemia panel with B12 at 922 iron 21 otherwise within the limits of normal.  Home medications include iron supplement.  We will resume No obvious signs of bleeding Daily CBC  Diabetes mellitus type 2 Last A1c 5.1 on 03/24/2020, well-controlled Continues with capillary blood sugars low end of normal.  Had an episode of hypoglycemia on 03/24/2020, poor oral intake.  Continues  with poor p.o. intake especially with today's lethargy Discontinued SSI due to hypoglycemia, Accu-Cheks, hypoglycemic protocol Regular diet for  now  Hypertension Continue chlorthalidone, diltiazem, Avapro, labetalol as needed  Dementia Delirium precautions-start Seroquel   Family Communication/Anticipated D/C date and plan/Code Status   DVT prophylaxis: Lovenox ordered. Code Status: Full Code.  Family Communication: daughter at bedside Disposition Plan: Status is: Inpatient  Remains inpatient appropriate because:IV treatments appropriate due to intensity of illness or inability to take PO and Inpatient level of care appropriate due to severity of illness   Dispo: The patient is from: SNF              Anticipated d/c is to: SNF              Anticipated d/c date is: 2 days              Patient currently is not medically stable to d/c.          Medical Consultants:    neruology   Anti-Infectives:    Cefepime  Fosfomycin  Subjective:   Quite lethargic.  Attempts to open eyes with sternal rub.  Moving all extremities to painful stimuli.   Objective:    Vitals:   03/28/20 1755 03/28/20 2015 03/29/20 0335 03/29/20 0758  BP: (!) 137/93 (!) 156/88 134/90 (!) 143/74  Pulse: 74 89 72 72  Resp: 18 15 15 17   Temp:  98.9 F (37.2 C) 98.2 F (36.8 C) 98 F (36.7 C)  TempSrc:  Axillary Axillary Oral  SpO2: 97% 100% 98% 97%  Weight:      Height:       No intake or output data in the 24 hours ending 03/29/20 0931 Filed Weights   03/23/20 1537  Weight: 84.4 kg    Exam: General: No acute distress quite lethargic unable to follow commands CV: Regular rate and rhythm no murmur gallop or rub Respiratory: No increased work of breathing breath sounds are distant but clear bilaterally hear no crackles Abdomen: Nondistended soft sluggish bowel sounds nontender to palpation no guarding or rebounding Musculoskeletal: Joints without swelling/erythema moves all extremities spontaneously Neuro: Quite lethargic unable to follow commands arouses to painful stimuli  Data Reviewed:   I have personally reviewed  following labs and imaging studies:  Labs: Labs show the following:   Basic Metabolic Panel: Recent Labs  Lab 03/25/20 0846 03/25/20 0846 03/26/20 0746 03/26/20 0746 03/27/20 0439 03/27/20 0439 03/28/20 0218 03/29/20 0719  NA 140  --  137  --  139  --  138 140  K 3.7   < > 3.7   < > 4.3   < > 3.7 3.4*  CL 104  --  105  --  103  --  102 105  CO2 25  --  23  --  24  --  22 25  GLUCOSE 100*  --  117*  --  93  --  88 93  BUN 16  --  14  --  13  --  20 23  CREATININE 1.43*  --  1.39*  --  1.39*  --  1.42* 1.35*  CALCIUM 9.1  --  8.7*  --  9.3  --  9.0 9.0   < > = values in this interval not displayed.   GFR Estimated Creatinine Clearance: 40.7 mL/min (A) (by C-G formula based on SCr of 1.35 mg/dL (H)). Liver Function Tests: Recent Labs  Lab 03/23/20 1515  AST 28  ALT 17  ALKPHOS 50  BILITOT 0.8  PROT 6.5  ALBUMIN 3.0*   No results for input(s): LIPASE, AMYLASE in the last 168 hours. Recent Labs  Lab 03/23/20 1642 03/29/20 0719  AMMONIA 34 22   Coagulation profile Recent Labs  Lab 03/23/20 1515  INR 1.1    CBC: Recent Labs  Lab 03/25/20 0846 03/26/20 0746 03/27/20 0439 03/28/20 0218 03/29/20 0719  WBC 5.9 4.7 6.3 7.9 6.7  NEUTROABS 3.7 3.4 4.0 5.4 4.7  HGB 10.6* 9.3* 11.4* 11.5* 10.6*  HCT 32.0* 27.7* 33.5* 33.3* 30.4*  MCV 101.3* 99.6 98.8 97.7 95.9  PLT 318 269 307 287 272   Cardiac Enzymes: No results for input(s): CKTOTAL, CKMB, CKMBINDEX, TROPONINI in the last 168 hours. BNP (last 3 results) No results for input(s): PROBNP in the last 8760 hours. CBG: Recent Labs  Lab 03/28/20 0747 03/28/20 1126 03/28/20 1622 03/28/20 2135 03/29/20 0641  GLUCAP 78 79 87 75 79   D-Dimer: No results for input(s): DDIMER in the last 72 hours. Hgb A1c: No results for input(s): HGBA1C in the last 72 hours. Lipid Profile: No results for input(s): CHOL, HDL, LDLCALC, TRIG, CHOLHDL, LDLDIRECT in the last 72 hours. Thyroid function studies: No results for  input(s): TSH, T4TOTAL, T3FREE, THYROIDAB in the last 72 hours.  Invalid input(s): FREET3 Anemia work up: Recent Labs    03/29/20 0719  VITAMINB12 922*  FOLATE 7.2  FERRITIN 256  TIBC 174*  IRON 21*   Sepsis Labs: Recent Labs  Lab 03/26/20 0746 03/27/20 0439 03/28/20 0218 03/29/20 0719  WBC 4.7 6.3 7.9 6.7    Microbiology Recent Results (from the past 240 hour(s))  Urine culture     Status: Abnormal   Collection Time: 03/23/20  7:21 PM   Specimen: Urine, Random  Result Value Ref Range Status   Specimen Description URINE, RANDOM  Final   Special Requests   Final    ADDED 2112 Performed at [facility], [address] [place], [place] [address]    Culture (A)  Final    90,000 COLONIES/mL PSEUDOMONAS AERUGINOSA 80,000 COLONIES/mL ENTEROCOCCUS FAECALIS    Report Status 03/25/2020 FINAL  Final   Organism ID, Bacteria PSEUDOMONAS AERUGINOSA (A)  Final   Organism ID, Bacteria ENTEROCOCCUS FAECALIS (A)  Final      Susceptibility   Enterococcus faecalis - MIC*    AMPICILLIN <=2 SENSITIVE Sensitive     NITROFURANTOIN <=16 SENSITIVE Sensitive     VANCOMYCIN 1 SENSITIVE Sensitive     * 80,000 COLONIES/mL ENTEROCOCCUS FAECALIS   Pseudomonas aeruginosa - MIC*    CEFTAZIDIME 4 SENSITIVE Sensitive     CIPROFLOXACIN <=0.25 SENSITIVE Sensitive     GENTAMICIN 2 SENSITIVE Sensitive     IMIPENEM 2 SENSITIVE Sensitive     PIP/TAZO 8 SENSITIVE Sensitive     CEFEPIME 4 SENSITIVE Sensitive     * 90,000 COLONIES/mL PSEUDOMONAS AERUGINOSA  SARS Coronavirus 2 by RT PCR (hospital order, performed in [facility] hospital lab) Nasopharyngeal Nasopharyngeal Swab     Status: None   Collection Time: 03/23/20  9:49 PM   Specimen: Nasopharyngeal Swab  Result Value Ref Range Status   SARS Coronavirus 2 NEGATIVE NEGATIVE Final    Comment: (NOTE) SARS-CoV-2 target nucleic acids are NOT DETECTED. The SARS-CoV-2 RNA is generally detectable in upper and lower respiratory specimens during  the acute phase of infection. The lowest concentration of SARS-CoV-2 viral copies this assay can detect is 250 copies / mL. A negative result does not  preclude SARS-CoV-2 infection and should not be used as the sole basis for treatment or other patient management decisions.  A negative result may occur with improper specimen collection / handling, submission of specimen other than nasopharyngeal swab, presence of viral mutation(s) within the areas targeted by this assay, and inadequate number of viral copies (<250 copies / mL). A negative result must be combined with clinical observations, patient history, and epidemiological information. Fact Sheet for Patients:   BoilerBrush.com.cy Fact Sheet for Healthcare Providers: https://pope.com/ This test is not yet approved or cleared  by the [place] FDA and has been authorized for detection and/or diagnosis of SARS-CoV-2 by FDA under an Emergency Use Authorization (EUA).  This EUA will remain in effect (meaning this test can be used) for the duration of the COVID-19 declaration under Section 564(b)(1) of the Act, 21 U.S.C. section 360bbb-3(b)(1), unless the authorization is terminated or revoked sooner. Performed at [facility], [address], [place], [place] [address]   Culture, blood (routine x 2)     Status: None (Preliminary result)   Collection Time: 03/24/20  8:40 PM   Specimen: BLOOD  Result Value Ref Range Status   Specimen Description BLOOD RIGHT ANTECUBITAL  Final   Special Requests AEROBIC BOTTLE ONLY Blood Culture adequate volume  Final   Culture   Final    NO GROWTH 4 DAYS Performed at [facility], [address] [place], [place] [address]    Report Status PENDING  Incomplete  Culture, blood (routine x 2)     Status: None (Preliminary result)   Collection Time: 03/24/20  8:51 PM   Specimen: BLOOD  Result Value Ref Range Status   Specimen Description  BLOOD LEFT ANTECUBITAL  Final   Special Requests AEROBIC BOTTLE ONLY Blood Culture adequate volume  Final   Culture   Final    NO GROWTH 4 DAYS Performed at [facility], [address], [place], [place] [address]    Report Status PENDING  Incomplete    Procedures and diagnostic studies:  No results found.  Medications:   . chlorthalidone  50 mg Oral Daily  . citalopram  10 mg Oral Daily  . diltiazem  420 mg Oral Daily  . docusate sodium  100 mg Oral BID  . enoxaparin (LOVENOX) injection  40 mg Subcutaneous Daily  . famotidine  10 mg Oral BID  . [START ON 03/30/2020] ferrous sulfate  325 mg Oral Q breakfast  . finasteride  5 mg Oral QHS  . fluticasone  1 spray Each Nare Daily  . fosfomycin  3 g Oral Q72H  . irbesartan  300 mg Oral Daily  . potassium chloride  40 mEq Oral Once  . QUEtiapine  25 mg Oral QHS  . sodium chloride flush  3 mL Intravenous Q12H  . traZODone  25 mg Oral QHS   Continuous Infusions: . sodium chloride    . ceFEPime (MAXIPIME) IV 2 g (03/28/20 0824)     LOS: 5 days   Gwenyth Bender NP [facility]   How to contact the [facility] Attending or Consulting provider 7A - 7P or covering provider during after hours 7P -7A, for this patient?  1. Check the care team in [facility] and look for a) attending/consulting [facility] provider listed and b) the [facility] team listed 2. Log into [email or web address] and use [facility]'s universal password to access. If you do not have the password, please contact the hospital operator. 3. Locate the [facility]  provider you are looking for under [facility] and page to a number that you can be directly reached. 4. If you still have difficulty reaching the provider, please page the [facility] (Director on Call) for the Hospitalists listed on amion for assistance.  03/29/2020, 9:31 AM

## 2020-03-29 NOTE — Progress Notes (Signed)
IV team is at the bedside for IV start

## 2020-03-30 LAB — GLUCOSE, CAPILLARY
Glucose-Capillary: 78 mg/dL (ref 70–99)
Glucose-Capillary: 73 mg/dL (ref 70–99)

## 2020-03-30 LAB — CBC WITH DIFFERENTIAL/PLATELET
Abs Immature Granulocytes: 0.03 10*3/uL (ref 0.00–0.07)
Basophils Absolute: 0.1 10*3/uL (ref 0.0–0.1)
Basophils Relative: 1 %
Eosinophils Absolute: 0.3 10*3/uL (ref 0.0–0.5)
Eosinophils Relative: 4 %
HCT: 30.3 % — ABNORMAL LOW (ref 39.0–52.0)
Hemoglobin: 10.5 g/dL — ABNORMAL LOW (ref 13.0–17.0)
Immature Granulocytes: 0 %
Lymphocytes Relative: 23 %
Lymphs Abs: 1.8 10*3/uL (ref 0.7–4.0)
MCH: 33.9 pg (ref 26.0–34.0)
MCHC: 34.7 g/dL (ref 30.0–36.0)
MCV: 97.7 fL (ref 80.0–100.0)
Monocytes Absolute: 0.7 10*3/uL (ref 0.1–1.0)
Monocytes Relative: 9 %
Neutro Abs: 5 10*3/uL (ref 1.7–7.7)
Neutrophils Relative %: 63 %
Platelets: 263 10*3/uL (ref 150–400)
RBC: 3.1 MIL/uL — ABNORMAL LOW (ref 4.22–5.81)
RDW: 12.9 % (ref 11.5–15.5)
WBC: 7.9 10*3/uL (ref 4.0–10.5)
nRBC: 0 % (ref 0.0–0.2)

## 2020-03-30 LAB — BASIC METABOLIC PANEL
Anion gap: 10 (ref 5–15)
BUN: 25 mg/dL — ABNORMAL HIGH (ref 8–23)
CO2: 24 mmol/L (ref 22–32)
Calcium: 8.8 mg/dL — ABNORMAL LOW (ref 8.9–10.3)
Chloride: 107 mmol/L (ref 98–111)
Creatinine, Ser: 1.45 mg/dL — ABNORMAL HIGH (ref 0.61–1.24)
GFR calc Af Amer: 49 mL/min — ABNORMAL LOW (ref >60)
GFR calc non Af Amer: 42 mL/min — ABNORMAL LOW (ref >60)
Glucose, Bld: 75 mg/dL (ref 70–99)
Potassium: 3.6 mmol/L (ref 3.5–5.1)
Sodium: 141 mmol/L (ref 135–145)

## 2020-03-30 MED ORDER — OXYCODONE HCL 5 MG/5ML PO SOLN
5.0000 mg | ORAL | Status: DC
  Administered 2020-03-30: 5 mg via ORAL
  Filled 2020-03-30: qty 5

## 2020-03-30 MED ORDER — HYDROMORPHONE HCL 1 MG/ML IJ SOLN
0.5000 mg | INTRAMUSCULAR | Status: AC
  Administered 2020-03-30: 0.5 mg via SUBCUTANEOUS
  Filled 2020-03-30: qty 1

## 2020-03-30 MED ORDER — OXYCODONE HCL 5 MG/5ML PO SOLN: 10.0000 mg | ORAL | Status: DC | PRN

## 2020-03-30 MED ORDER — HYDROMORPHONE HCL 1 MG/ML IJ SOLN: 1.0000 mg | INTRAMUSCULAR | Status: DC | PRN

## 2020-03-30 MED ORDER — DIPHENHYDRAMINE HCL 50 MG/ML IJ SOLN
25.0000 mg | Freq: Four times a day (QID) | INTRAMUSCULAR | Status: DC | PRN
  Administered 2020-03-30: 25 mg via INTRAMUSCULAR

## 2020-03-30 MED ORDER — HYDROMORPHONE HCL 1 MG/ML IJ SOLN
1.0000 mg | INTRAMUSCULAR | Status: AC
  Filled 2020-03-30: qty 1

## 2020-03-30 MED ORDER — LORAZEPAM 2 MG/ML PO CONC
1.0000 mg | ORAL | Status: DC | PRN
  Administered 2020-03-30: 1 mg via ORAL
  Filled 2020-03-30: qty 1

## 2020-03-30 MED ORDER — HYDROMORPHONE HCL 2 MG PO TABS: 1.0000 mg | ORAL_TABLET | Freq: Four times a day (QID) | ORAL | Status: DC

## 2020-03-30 MED ORDER — DIPHENHYDRAMINE HCL 50 MG/ML IJ SOLN
INTRAMUSCULAR | Status: AC
  Filled 2020-03-30: qty 1

## 2020-03-30 MED ORDER — LORAZEPAM 2 MG/ML PO CONC: 1.0000 mg | Freq: Four times a day (QID) | ORAL | Status: DC

## 2020-03-30 MED ORDER — HYDROMORPHONE HCL 1 MG/ML PO LIQD: 1.0000 mg | ORAL | Status: DC | PRN

## 2020-03-30 MED ORDER — HYDROMORPHONE HCL 1 MG/ML PO LIQD: 1.0000 mg | Freq: Four times a day (QID) | ORAL | Status: DC

## 2020-03-30 MED ORDER — HALOPERIDOL LACTATE 2 MG/ML PO CONC
1.0000 mg | ORAL | Status: DC | PRN
  Filled 2020-03-30: qty 0.5

## 2020-03-30 MED ORDER — HYDROXYZINE HCL 50 MG/ML IM SOLN
25.0000 mg | INTRAMUSCULAR | Status: DC | PRN
  Filled 2020-03-30: qty 0.5

## 2020-03-30 MED ORDER — DIPHENHYDRAMINE HCL 50 MG/ML IJ SOLN: 6.2500 mg | Freq: Four times a day (QID) | INTRAMUSCULAR | Status: DC | PRN

## 2020-03-30 MED ORDER — LORAZEPAM 2 MG/ML PO CONC
1.0000 mg | ORAL | Status: DC
  Administered 2020-03-30 – 2020-03-31 (×3): 1 mg via ORAL
  Filled 2020-03-30 (×4): qty 1

## 2020-03-30 MED ORDER — ACETAMINOPHEN 650 MG RE SUPP: 650.0000 mg | RECTAL | Status: DC | PRN

## 2020-03-30 MED ORDER — HYDROMORPHONE HCL 2 MG PO TABS: 1.0000 mg | ORAL_TABLET | ORAL | Status: DC | PRN

## 2020-03-30 NOTE — Plan of Care (Signed)
  Problem: Education: Goal: Knowledge of General Education information will improve Description: Including pain rating scale, medication(s)/side effects and non-pharmacologic comfort measures Outcome: Progressing   Problem: Clinical Measurements: Goal: Will remain free from infection Outcome: Progressing   Problem: Clinical Measurements: Goal: Respiratory complications will improve Outcome: Progressing   Problem: Nutrition: Goal: Adequate nutrition will be maintained Outcome: Progressing   Problem: Coping: Goal: Level of anxiety will decrease Outcome: Progressing   Problem: Elimination: Goal: Will not experience complications related to bowel motility Outcome: Progressing

## 2020-03-30 NOTE — Evaluation (Signed)
Clinical/Bedside Swallow Evaluation Patient Details  Name: Jason Vasquez MRN: [id number] Date of Birth: 1929-03-22  Today's Date: 03/30/2020 Time: SLP Start Time (ACUTE ONLY): 0840 SLP Stop Time (ACUTE ONLY): 0900 SLP Time Calculation (min) (ACUTE ONLY): 20 min  Past Medical History:  Past Medical History:  Diagnosis Date  . Anemia   . CKD (chronic kidney disease)   . COPD (chronic obstructive pulmonary disease) (HCC)   . Dementia (HCC)   . Diabetes mellitus without complication (HCC)   . Hyperlipemia   . Hypertension   . Sleep apnea    Past Surgical History:  Past Surgical History:  Procedure Laterality Date  . QUADRICEPS TENDON REPAIR Right 09/24/2019   Procedure: REPAIR RIGHT QUADRICEP TENDON;  Surgeon: Beverely Low, MD;  Location: [facility];  Service: Orthopedics;  Laterality: Right;   HPI:  86 year old SNF male presenting to the hospital as code stroke secondary to altered mental status and right-sided weakness.  MRI head negative for stroke. Encephalopathy due to UTI suspected. Pt failed [facility] on 5/12 due to lethargy, but also started on a regular diet. SLP ordered, but pt toelrating diet so order discontinued. Patient seems to be progressively declining despite all medical management. Was given a dose of IV Benadryl on 03/28/2020, continues to be lethargic since then.  Patient not able to tolerate p.o. due to lethargy, started IV fluids.  Chest x-ray with nothing acute, noted bilateral knee pain, unable to perform x-ray of the knees as patient is unable to straighten his legs.    Assessment / Plan / Recommendation Clinical Impression  Pt is delirious, no functional response to most external stimuli. Pt  in a fetal position sideways in bed, eyes open but not responsive. Pt tilted upright, oral care performed. After oral care, when small amounts of liquids were siphoned into the mouth with straw via eye dropper technique, pt did respond with a labial seal and swallow response. He could  not advance to spoon feeding of puree. Overall he has the capacity to swallow if mentation improves. Recommend no PO intake until pt is appropriately responsive. Reported to RN and MD.  SLP Visit Diagnosis: Dysphagia, unspecified (R13.10)    Aspiration Risk  Risk for inadequate nutrition/hydration;Severe aspiration risk    Diet Recommendation NPO        Other  Recommendations Oral Care Recommendations: Oral care QID Other Recommendations: Have oral suction available   Follow up Recommendations Skilled Nursing facility      Frequency and Duration min 2x/week  2 weeks       Prognosis Barriers to Reach Goals: Cognitive deficits;Severity of deficits      Swallow Study   General HPI: 86 year old SNF male presenting to the hospital as code stroke secondary to altered mental status and right-sided weakness.  MRI head negative for stroke. Encephalopathy due to UTI suspected. Pt failed [facility] on 5/12 due to lethargy, but also started on a regular diet. SLP ordered, but pt toelrating diet so order discontinued. Patient seems to be progressively declining despite all medical management. Was given a dose of IV Benadryl on 03/28/2020, continues to be lethargic since then.  Patient not able to tolerate p.o. due to lethargy, started IV fluids.  Chest x-ray with nothing acute, noted bilateral knee pain, unable to perform x-ray of the knees as patient is unable to straighten his legs.  Type of Study: Bedside Swallow Evaluation Diet Prior to this Study: Regular;Thin liquids Temperature Spikes Noted: No Respiratory Status: Room air History of Recent Intubation: No  Behavior/Cognition: Confused;Doesn't follow directions Oral Cavity Assessment: Within Functional Limits Oral Care Completed by SLP: Yes Oral Cavity - Dentition: Adequate natural dentition Patient Positioning: Upright in bed;Postural control interferes with function Baseline Vocal Quality: Not observed Volitional Cough: Cognitively unable to  elicit    Oral/Motor/Sensory Function Overall Oral Motor/Sensory Function: Generalized oral weakness   Ice Chips     Thin Liquid Thin Liquid: Impaired Presentation: Straw Oral Phase Impairments: Poor awareness of bolus    Nectar Thick Nectar Thick Liquid: Not tested   Honey Thick Honey Thick Liquid: Not tested   Puree Puree: Impaired Presentation: Spoon Oral Phase Impairments: Poor awareness of bolus   Solid     Solid: Not tested     Herbie Baltimore, MA [place] Pager [phone] Office [phone]  Lynann Beaver 03/30/2020,10:06 AM

## 2020-03-30 NOTE — Progress Notes (Signed)
Spoke with family and was told that the patient no longer needs a PIV per Shawna Orleans RN from palliative care, and family is refusing the patient to be stuck. MD Sharolyn Douglas made aware along with RN Aram Beecham

## 2020-03-30 NOTE — Progress Notes (Signed)
The patient's daughters are with him at the bedside.  The nurse is awaiting for new orders to be verified.  Ativan 1mg  given at this times for restlessness.

## 2020-03-30 NOTE — Progress Notes (Signed)
Mittens and linens changed

## 2020-03-30 NOTE — Progress Notes (Signed)
This nurse just received chat message in regards for request of difficult IV start- response from IV team, they are unable to restart IV due to the multiple IV starts in the last several days.  The patient has had safety mittens in place.  The patient has had agitation and scratching his arms and legs continuously' even after being medicated as ordered for itching.  I have sent a message to Dr Sharolyn Douglas in regards to the IV situation.  The IV team had requested intervention per IR

## 2020-03-30 NOTE — Progress Notes (Addendum)
Pt had pulled out IV to rt forearm that palliative team doctor Dr. Phillips Odor had placed. Family states that they hope another IV is not placed. Dr. Phillips Odor notified that pt had pulled out IV. Dr. Phillips Odor verbalized to leave IV out and Dilaudid can be given SQ tonight if pt needs it. Will continue to monitor pt. Nelda Marseille, RN

## 2020-03-30 NOTE — Progress Notes (Signed)
Palliative Medicine RN Note: Met with three daughters.  Mr Rossetti had almost no relief from Benadryl. Suspect there is an element of pain. I will ask Dr Hilma Favors for an order for pain meds, as well as a change to Atarax to see if he can get better relief that way.  His daughters and I discussed his medications, illnesses, and his current status, focusing on his lack of IV access and what would need to happen to obtain IV access. They agree that he would NOT want to purse those aggressive measures when there is likely little chance he will return to his previous baseline. There is great concern that he will continue to pull at any and all line we attempt to place.  His daughters all clearly and emphatically agree that they want to focus on dignity and comfort now, and they know that is what Mr Cumbie would want. I updated Dr Hilma Favors, who will see him face to face later tonight. She gave me orders to address his symptoms in the interim. All medications will be sublingual/IM/SQ. There is NO NEED for IV placement.  I updated his RN Caren Griffins about the change, and I called the front desk to let the know that he can have more visitors.   Marjie Skiff Odyn Turko, RN, BSN, [facility] Palliative Medicine Team 03/30/2020 1:26 PM Office [phone]

## 2020-03-30 NOTE — Progress Notes (Signed)
At this time IV team is unable to place a PIV in this patient due to him being a little disoriented and requiring someone to hold him. He has had 5 PIVS in 6 days and removed them all. Several IV nurses have asked the nurses on the unit to wrap the IV's, place mittens, possible restraints, and have been unsuccessful. So at this time he may need something more permanent placed by IR or maybe he can receive IM or PO. MD Sharolyn Douglas and RN Aram Beecham were notified via secure chat.

## 2020-03-30 NOTE — Progress Notes (Signed)
Progress Note    Jason Vasquez  ONG:[id number] DOB: 02-Nov-1929  DOA: 03/23/2020 PCP: Angelica Pou, MD    Brief Narrative:    Medical records reviewed and are as summarized below:  Jason Vasquez is an 86 y.o. male with a past medical history significant for hypertension, hyperlipidemia, diabetes, sleep apnea, dementia residing at [facility] prior to admission admitted May 12 after a fall and hitting his head and later found unresponsive.  CT the head did not show any intracranial abnormalities.  Work-up did reveal urinary tract infection.  You antibiotics were started in the ED urine culture sent.  Evaluated by neurology recommended MRI of the brain and MRA of head and neck which were unremarkable.  Patient progressively declining despite all medical management.  Assessment/Plan:   Principal Problem:   Acute metabolic encephalopathy Active Problems:   Acute lower UTI   CKD (chronic kidney disease), stage III   DM2 (diabetes mellitus, type 2) (HCC)   HLD (hyperlipidemia)   #1.  Acute metabolic encephalopathy likely related to urinary tract infection in the setting of dementia.  He remains lethargic but not quite as bad as yesterday.  Appears to be itching as he is scratching the point he has break in the skin on his arm.  Has lost several IVs.  Ammonia level within the limits of normal, RPR nonreactive, vitamin B12 within the limits of normal.  MRI of the brain, EEG have been performed as noted above.  Unable to take p.o.'s due to lethargy.  Evaluated by speech therapy and demonstrated mechanics of swallowing via dropper.  Recommending n.p.o. -Continue symptomatic treatment -N.p.o. -Have decreased the dosage on the Benadryl -Consider IR intervention to reestablish IV access -Palliative care consult  #2.  Pseudomonas aeruginosa/E faecalis UTI.  Per culture.  Blood cultures no growth to date.  Has received 2 doses of fosfomycin and ongoing cefepime.  He is afebrile hemodynamically  stable -Continue cefepime.  Of note 1 g cefepime started on May 13 and ended May 15 and dosage increased to 2 g May 16 making this day 6 of antibiotics. -He also received Rocephin in the emergency department  #3.  Chronic kidney disease stage III.  Creatinine remains stable. -Monitor  4.  Anemia of chronic disease.  Hemoglobin stable in the range of 10-11.  Anemia panel with B12 at 922, iron 21 otherwise within the limits of normal.  Home medications do include an iron supplement.  No sign symptoms of active bleeding -Continue home iron supplement  #5.  Diabetes type 2.  Controlled.  Hemoglobin A1c 5.1.  Had an episode of hypoglycemia May 12 related to poor p.o. intake. -Stop sliding scale due to hypoglycemia -Accu-Cheks -Hypoglycemia protocol  #6.  Hypertension.  Blood pressure high end of normal home medications include telmisartan, diltiazem, [place] -Holding home meds for now -As needed labetalol with parameters  7.  Dementia.  Patient comes from [facility].  See #1 -Delirium precautions -Seroquel as able     Family Communication/Anticipated D/C date and plan/Code Status   DVT prophylaxis: Lovenox ordered. Code Status: Full Code.  Family Communication: 4 daughters in hallway Disposition Plan: Status is: Inpatient  Remains inpatient appropriate because:IV treatments appropriate due to intensity of illness or inability to take PO   Dispo: The patient is from: SNF              Anticipated d/c is to: SNF  Anticipated d/c date is: 2 days              Patient currently is not medically stable to d/c.          Medical Consultants:    neurology   Anti-Infectives:    Rocephin 5/11-5/13  Cefepime 5/13 >>>(increase dose 5/16)  Subjective:   Lying in bed fetal position sideways at the head of the bed eyes are open but he is not responsive.  Appears to be scratching his leg and his head.    Objective:    Vitals:   03/29/20 0758 03/29/20 2058  03/30/20 0417 03/30/20 0732  BP: (!) 143/74 (!) 164/61 (!) 164/88 (!) 146/89  Pulse: 72 71 66 87  Resp: 17 18 19 17   Temp: 98 F (36.7 C) 98.5 F (36.9 C) 98.3 F (36.8 C) 98.2 F (36.8 C)  TempSrc: Oral Axillary Axillary Axillary  SpO2: 97%  96% 99%  Weight:      Height:        Intake/Output Summary (Last 24 hours) at 03/30/2020 1106 Last data filed at 03/30/2020 0700 Gross per 24 hour  Intake 898.69 ml  Output 600 ml  Net 298.69 ml   Filed Weights   03/23/20 1537  Weight: 84.4 kg    Exam: General: No acute distress lethargic unable to follow commands scratching leg and head. CV: Regular rate and rhythm no murmur gallop or rub Respiratory: No increased work of breathing breath sounds are distant but clear bilaterally hear no crackles Abdomen: Nondistended soft sluggish bowel sounds nontender to palpation no guarding or rebounding Musculoskeletal: Joints without swelling/erythema moves all extremities spontaneously Neuro: lethargic unable to follow commands.  Moving extremities spontaneously but not in a purposeful fashion does not follow commands or engage   Data Reviewed:   I have personally reviewed following labs and imaging studies:  Labs: Labs show the following:   Basic Metabolic Panel: Recent Labs  Lab 03/26/20 0746 03/26/20 0746 03/27/20 0439 03/27/20 0439 03/28/20 0218 03/28/20 0218 03/29/20 0719 03/30/20 0639  NA 137  --  139  --  138  --  140 141  K 3.7   < > 4.3   < > 3.7   < > 3.4* 3.6  CL 105  --  103  --  102  --  105 107  CO2 23  --  24  --  22  --  25 24  GLUCOSE 117*  --  93  --  88  --  93 75  BUN 14  --  13  --  20  --  23 25*  CREATININE 1.39*  --  1.39*  --  1.42*  --  1.35* 1.45*  CALCIUM 8.7*  --  9.3  --  9.0  --  9.0 8.8*   < > = values in this interval not displayed.   GFR Estimated Creatinine Clearance: 37.9 mL/min (A) (by C-G formula based on SCr of 1.45 mg/dL (H)). Liver Function Tests: Recent Labs  Lab 03/23/20 1515    AST 28  ALT 17  ALKPHOS 50  BILITOT 0.8  PROT 6.5  ALBUMIN 3.0*   No results for input(s): LIPASE, AMYLASE in the last 168 hours. Recent Labs  Lab 03/23/20 1642 03/29/20 0719  AMMONIA 34 22   Coagulation profile Recent Labs  Lab 03/23/20 1515  INR 1.1    CBC: Recent Labs  Lab 03/26/20 0746 03/27/20 0439 03/28/20 0218 03/29/20 0719 03/30/20 0639  WBC 4.7 6.3  7.9 6.7 7.9  NEUTROABS 3.4 4.0 5.4 4.7 5.0  HGB 9.3* 11.4* 11.5* 10.6* 10.5*  HCT 27.7* 33.5* 33.3* 30.4* 30.3*  MCV 99.6 98.8 97.7 95.9 97.7  PLT 269 307 287 272 263   Cardiac Enzymes: No results for input(s): CKTOTAL, CKMB, CKMBINDEX, TROPONINI in the last 168 hours. BNP (last 3 results) No results for input(s): PROBNP in the last 8760 hours. CBG: Recent Labs  Lab 03/29/20 0641 03/29/20 1148 03/29/20 1702 03/29/20 2034 03/30/20 0731  GLUCAP 79 84 83 81 78   D-Dimer: No results for input(s): DDIMER in the last 72 hours. Hgb A1c: No results for input(s): HGBA1C in the last 72 hours. Lipid Profile: No results for input(s): CHOL, HDL, LDLCALC, TRIG, CHOLHDL, LDLDIRECT in the last 72 hours. Thyroid function studies: No results for input(s): TSH, T4TOTAL, T3FREE, THYROIDAB in the last 72 hours.  Invalid input(s): FREET3 Anemia work up: Recent Labs    03/29/20 0719  VITAMINB12 922*  FOLATE 7.2  FERRITIN 256  TIBC 174*  IRON 21*   Sepsis Labs: Recent Labs  Lab 03/27/20 0439 03/28/20 0218 03/29/20 0719 03/30/20 0639  WBC 6.3 7.9 6.7 7.9    Microbiology Recent Results (from the past 240 hour(s))  Urine culture     Status: Abnormal   Collection Time: 03/23/20  7:21 PM   Specimen: Urine, Random  Result Value Ref Range Status   Specimen Description URINE, RANDOM  Final   Special Requests   Final    ADDED 2112 Performed at [facility], [address] [place], [place] [address]    Culture (A)  Final    90,000 COLONIES/mL PSEUDOMONAS AERUGINOSA 80,000 COLONIES/mL ENTEROCOCCUS  FAECALIS    Report Status 03/25/2020 FINAL  Final   Organism ID, Bacteria PSEUDOMONAS AERUGINOSA (A)  Final   Organism ID, Bacteria ENTEROCOCCUS FAECALIS (A)  Final      Susceptibility   Enterococcus faecalis - MIC*    AMPICILLIN <=2 SENSITIVE Sensitive     NITROFURANTOIN <=16 SENSITIVE Sensitive     VANCOMYCIN 1 SENSITIVE Sensitive     * 80,000 COLONIES/mL ENTEROCOCCUS FAECALIS   Pseudomonas aeruginosa - MIC*    CEFTAZIDIME 4 SENSITIVE Sensitive     CIPROFLOXACIN <=0.25 SENSITIVE Sensitive     GENTAMICIN 2 SENSITIVE Sensitive     IMIPENEM 2 SENSITIVE Sensitive     PIP/TAZO 8 SENSITIVE Sensitive     CEFEPIME 4 SENSITIVE Sensitive     * 90,000 COLONIES/mL PSEUDOMONAS AERUGINOSA  SARS Coronavirus 2 by RT PCR (hospital order, performed in [facility] hospital lab) Nasopharyngeal Nasopharyngeal Swab     Status: None   Collection Time: 03/23/20  9:49 PM   Specimen: Nasopharyngeal Swab  Result Value Ref Range Status   SARS Coronavirus 2 NEGATIVE NEGATIVE Final    Comment: (NOTE) SARS-CoV-2 target nucleic acids are NOT DETECTED. The SARS-CoV-2 RNA is generally detectable in upper and lower respiratory specimens during the acute phase of infection. The lowest concentration of SARS-CoV-2 viral copies this assay can detect is 250 copies / mL. A negative result does not preclude SARS-CoV-2 infection and should not be used as the sole basis for treatment or other patient management decisions.  A negative result may occur with improper specimen collection / handling, submission of specimen other than nasopharyngeal swab, presence of viral mutation(s) within the areas targeted by this assay, and inadequate number of viral copies (<250 copies / mL). A negative result must be combined with clinical observations, patient history, and epidemiological  information. Fact Sheet for Patients:   BoilerBrush.com.cy Fact Sheet for Healthcare  Providers: https://pope.com/ This test is not yet approved or cleared  by the [place] FDA and has been authorized for detection and/or diagnosis of SARS-CoV-2 by FDA under an Emergency Use Authorization (EUA).  This EUA will remain in effect (meaning this test can be used) for the duration of the COVID-19 declaration under Section 564(b)(1) of the Act, 21 U.S.C. section 360bbb-3(b)(1), unless the authorization is terminated or revoked sooner. Performed at [facility], [address] [place], [place] [address]   Culture, blood (routine x 2)     Status: None   Collection Time: 03/24/20  8:40 PM   Specimen: BLOOD  Result Value Ref Range Status   Specimen Description BLOOD RIGHT ANTECUBITAL  Final   Special Requests AEROBIC BOTTLE ONLY Blood Culture adequate volume  Final   Culture   Final    NO GROWTH 5 DAYS Performed at [facility], [address], [place], [place] [address]    Report Status 03/29/2020 FINAL  Final  Culture, blood (routine x 2)     Status: None   Collection Time: 03/24/20  8:51 PM   Specimen: BLOOD  Result Value Ref Range Status   Specimen Description BLOOD LEFT ANTECUBITAL  Final   Special Requests AEROBIC BOTTLE ONLY Blood Culture adequate volume  Final   Culture   Final    NO GROWTH 5 DAYS Performed at [facility], [address] [place], [place] [address]    Report Status 03/29/2020 FINAL  Final    Procedures and diagnostic studies:  DG CHEST PORT 1 VIEW  Result Date: 03/29/2020 CLINICAL DATA:  Shortness of breath EXAM: PORTABLE CHEST 1 VIEW COMPARISON:  Mar 23 2020 FINDINGS: Interstitial prominence and patchy density bilaterally. Probable small pleural effusions. No pneumothorax. Stable cardiomediastinal contours. IMPRESSION: Probable mild pulmonary edema with small pleural effusions. Electronically Signed   By: Guadlupe Spanish M.D.   On: 03/29/2020 11:47    Medications:   . chlorthalidone  50 mg Oral  Daily  . citalopram  10 mg Oral Daily  . diltiazem  420 mg Oral Daily  . docusate sodium  100 mg Oral BID  . enoxaparin (LOVENOX) injection  40 mg Subcutaneous Daily  . famotidine  10 mg Oral BID  . ferrous sulfate  325 mg Oral Q breakfast  . finasteride  5 mg Oral QHS  . fluticasone  1 spray Each Nare Daily  . fosfomycin  3 g Oral Q72H  . irbesartan  300 mg Oral Daily  . potassium chloride  40 mEq Oral Once  . QUEtiapine  25 mg Oral QHS  . sodium chloride flush  3 mL Intravenous Q12H  . traZODone  25 mg Oral QHS   Continuous Infusions: . sodium chloride    . sodium chloride 50 mL/hr at 03/30/20 0929  . ceFEPime (MAXIPIME) IV 2 g (03/29/20 1217)     LOS: 6 days   Gwenyth Bender NP  [facility]   How to contact the [facility] Attending or Consulting provider 7A - 7P or covering provider during after hours 7P -7A, for this patient?  1. Check the care team in [facility] and look for a) attending/consulting [facility] provider listed and b) the [facility] team listed 2. Log into [email or web address] and use [facility]'s universal password to access. If you do not have the password, please contact the hospital operator. 3. Locate the [facility] provider you are  looking for under [facility] and page to a number that you can be directly reached. 4. If you still have difficulty reaching the provider, please page the [facility] (Director on Call) for the Hospitalists listed on amion for assistance.  03/30/2020, 11:06 AM

## 2020-03-30 NOTE — Progress Notes (Signed)
Palliative Medicine Team notified of the patient not tolerating any type of po medication.  The last medication given, the patient was coughing and not swallowing well.

## 2020-03-30 NOTE — Plan of Care (Signed)

## 2020-03-30 NOTE — Progress Notes (Signed)
Palliative Medicine RN Note: Rec'd a call from Dr Julaine Fusi, PMT Medical Director, who rec'd a call from 5 [facility] staff, who is asking for assistance with symptoms. They report that Mr Cuppett has no IV and is agitated.  I discussed his case and concerns with Dr Phillips Odor, who asked me to come see him so she can make emergency interim recommendations until she can get here later tonight.  I arrived to find him very agitated. Every time someone spoke to him, he began to scratch at his chest. He is in mitts. He is rolling back and forth slightly in the bed. His family asked about re-starting the IV. The last note I saw was from the IV team that said they are unable to place a line, and I let the family know. They are very concerned about his itching, and I got an order from Dr Phillips Odor for Benadryl IM to help with the itching.    Margret Chance Maryruth Apple, RN, BSN, [facility] Palliative Medicine Team 03/30/2020 12:03 PM Office [phone]

## 2020-03-30 NOTE — Progress Notes (Signed)
Palliative Medical Team nurse is presently at the bedside for comfort care

## 2020-03-30 NOTE — Progress Notes (Signed)
A new order has been sent to the IV team as instructed per Aurelio Brash NP.

## 2020-03-31 MED ORDER — HALOPERIDOL LACTATE 5 MG/ML IJ SOLN: 5.0000 mg | Freq: Four times a day (QID) | INTRAMUSCULAR | Status: DC | PRN

## 2020-03-31 MED ORDER — FENTANYL 12 MCG/HR TD PT72
1.0000 | MEDICATED_PATCH | TRANSDERMAL | Status: DC
  Administered 2020-03-31: 1 via TRANSDERMAL
  Filled 2020-03-31: qty 1

## 2020-03-31 MED ORDER — SCOPOLAMINE 1 MG/3DAYS TD PT72
1.0000 | MEDICATED_PATCH | TRANSDERMAL | Status: DC
  Administered 2020-03-31: 1.5 mg via TRANSDERMAL
  Filled 2020-03-31: qty 1

## 2020-03-31 MED ORDER — DIAZEPAM 5 MG/ML PO CONC: 10.0000 mg | Freq: Four times a day (QID) | ORAL | Status: DC | PRN

## 2020-03-31 MED ORDER — DIPHENHYDRAMINE HCL 50 MG/ML IJ SOLN
25.0000 mg | Freq: Four times a day (QID) | INTRAMUSCULAR | Status: DC | PRN
  Administered 2020-03-31: 25 mg via INTRAMUSCULAR
  Filled 2020-03-31: qty 1

## 2020-03-31 MED ORDER — DIAZEPAM 5 MG PO TABS: 10.0000 mg | ORAL_TABLET | Freq: Four times a day (QID) | ORAL | Status: DC | PRN

## 2020-03-31 MED ORDER — DIPHENHYDRAMINE HCL 50 MG/ML IJ SOLN: 25.0000 mg | Freq: Three times a day (TID) | INTRAMUSCULAR | Status: DC

## 2020-03-31 MED ORDER — LORAZEPAM 2 MG/ML IJ SOLN: 0.5000 mg | INTRAMUSCULAR | Status: DC | PRN

## 2020-03-31 NOTE — Progress Notes (Addendum)
[facility] PROGRESS NOTE    Progress Note  Jason Vasquez  HQI:[id number] DOB: Mar 16, 1929 DOA: 03/23/2020 PCP: Miguel Aschoff, MD     Brief Narrative:   Jason Vasquez is an 86 y.o. male past medical history significant of essential hypertension, diabetes mellitus type 2, sleep apnea dementia who resides in [place] admitted on 03/24/2020 after a fall and hitting her head and later found unresponsive, CT did not show any acute intracranial abnormalities.  She was found to have a UTI started empirically on antibiotics, neurology was consulted who recommended an MRI/MRA of the neck which was unremarkable.  Assessment/Plan:   Acute metabolic encephalopathy likely due to possible acute lower urinary tract infection in the setting of dementia: Her encephalopathy seems to be improving, high ammonia level unremarkable, RPR nonreactive, B12 unremarkable MRI of the brain showed no acute findings, EEG showed no source of seizure. Speech evaluated the patient and recommended n.p.o. Hospice and palliative care has been consulted question with family and they would want him on hospice house in [facility] for hospice care. Patient is stable for medical transfer.  Pseudomonas/Enterococcus E. coli UTI: On urine cultures she was started on IV cefepime she has completed her course of IV empiric antibiotics in house.  CKD (chronic kidney disease), stage III Chronic and stable.  Anemia of chronic disease: Appears stable, will have to DC diet able to wean towards comfort care.  Diabetes mellitus type 2: With an A1c of 5.0 had an episode of hypoglycemia, oral hypoglycemic agents have been stopped.  Essential hypertension: Continue to hold all antihypertensive medication, blood pressure medication is at goal without antihypertensive medication.  Dementia: Noted.  Stage II sacral decubitus ulcer present on admission: RN Pressure Injury Documentation: Pressure Injury 09/27/19 Buttocks  Left;Medial Stage II -  Partial thickness loss of dermis presenting as a shallow open ulcer with a red, pink wound bed without slough. Oval on left buttock. (Active)  09/27/19 0500  Location: Buttocks  Location Orientation: Left;Medial  Staging: Stage II -  Partial thickness loss of dermis presenting as a shallow open ulcer with a red, pink wound bed without slough.  Wound Description (Comments): Oval on left buttock.  Present on Admission: No    Estimated body mass index is 25.23 kg/m as calculated from the following:   Height as of this encounter: 6' (1.829 m).   Weight as of this encounter: 84.4 kg.   DVT prophylaxis: lovenox Family Communication:DAughter Status is: Inpatient  Remains inpatient appropriate because:Unsafe d/c plan   Dispo: The patient is from: SNF              Anticipated d/c is to: Residential hospice facility              Anticipated d/c date is: 1 day              Patient currently is medically stable to d/c.         Code Status:     Code Status Orders  (From admission, onward)         Start     Ordered   03/24/20 0108  Do not attempt resuscitation (DNR)  Continuous    Question Answer Comment  In the event of cardiac or respiratory ARREST Do not call a "code blue"   In the event of cardiac or respiratory ARREST Do not perform Intubation, CPR, defibrillation or ACLS   In the event of cardiac or respiratory ARREST Use medication by any route, position,  wound care, and other measures to relive pain and suffering. May use oxygen, suction and manual treatment of airway obstruction as needed for comfort.      03/24/20 0107        Code Status History    Date Active Date Inactive Code Status Order ID Comments User Context   03/24/2020 0021 03/24/2020 0107 Full Code [id number]  [name], MD ED   09/23/2019 1744 10/04/2019 1157 Full Code [id number]  Cline Crock, PA-C Inpatient   Advance Care Planning Activity    Advance Directive  Documentation     Most Recent Value  Type of Advance Directive  Out of facility DNR (pink MOST or yellow form)  Pre-existing out of facility DNR order (yellow form or pink MOST form)  Pink MOST/Yellow Form most recent copy in chart - Physician notified to receive inpatient order  "MOST" Form in Place?  --        IV Access:    Peripheral IV   Procedures and diagnostic studies:   DG CHEST PORT 1 VIEW  Result Date: 03/29/2020 CLINICAL DATA:  Shortness of breath EXAM: PORTABLE CHEST 1 VIEW COMPARISON:  Mar 23 2020 FINDINGS: Interstitial prominence and patchy density bilaterally. Probable small pleural effusions. No pneumothorax. Stable cardiomediastinal contours. IMPRESSION: Probable mild pulmonary edema with small pleural effusions. Electronically Signed   By: Macy Mis M.D.   On: 03/29/2020 11:47     Medical Consultants:    None.  Anti-Infectives:   none  Subjective:    [name] nonverbal  Objective:    Vitals:   03/29/20 2058 03/30/20 0417 03/30/20 0732 03/31/20 0300  BP: (!) 164/61 (!) 164/88 (!) 146/89 (!) 150/68  Pulse: 71 66 87 84  Resp: 18 19 17 16   Temp: 98.5 F (36.9 C) 98.3 F (36.8 C) 98.2 F (36.8 C) 98 F (36.7 C)  TempSrc: Axillary Axillary Axillary Axillary  SpO2:  96% 99% 99%  Weight:      Height:       SpO2: 99 %   Intake/Output Summary (Last 24 hours) at 03/31/2020 1129 Last data filed at 03/31/2020 0900 Gross per 24 hour  Intake 0 ml  Output 250 ml  Net -250 ml   Filed Weights   03/23/20 1537  Weight: 84.4 kg    Exam: General exam: In no acute distress, cachectic Respiratory system: Good air movement and clear to auscultation. Cardiovascular system: S1 & S2 heard, RRR.  Gastrointestinal system: Abdomen is nondistended, soft and nontender.  Extremities: No pedal edema. Skin: No rashes, lesions or ulcers  Data Reviewed:    Labs: Basic Metabolic Panel: Recent Labs  Lab 03/26/20 0746 03/26/20 0746 03/27/20 0439  03/27/20 0439 03/28/20 0218 03/28/20 0218 03/29/20 0719 03/30/20 0639  NA 137  --  139  --  138  --  140 141  K 3.7   < > 4.3   < > 3.7   < > 3.4* 3.6  CL 105  --  103  --  102  --  105 107  CO2 23  --  24  --  22  --  25 24  GLUCOSE 117*  --  93  --  88  --  93 75  BUN 14  --  13  --  20  --  23 25*  CREATININE 1.39*  --  1.39*  --  1.42*  --  1.35* 1.45*  CALCIUM 8.7*  --  9.3  --  9.0  --  9.0 8.8*   < > = values in this interval not displayed.   GFR Estimated Creatinine Clearance: 37.9 mL/min (A) (by C-G formula based on SCr of 1.45 mg/dL (H)). Liver Function Tests: No results for input(s): AST, ALT, ALKPHOS, BILITOT, PROT, ALBUMIN in the last 168 hours. No results for input(s): LIPASE, AMYLASE in the last 168 hours. Recent Labs  Lab 03/29/20 0719  AMMONIA 22   Coagulation profile No results for input(s): INR, PROTIME in the last 168 hours. COVID-19 Labs  Recent Labs    03/29/20 0719  FERRITIN 256    Lab Results  Component Value Date   SARSCOV2NAA NEGATIVE 03/23/2020   SARSCOV2NAA POSITIVE (A) 09/23/2019    CBC: Recent Labs  Lab 03/26/20 0746 03/27/20 0439 03/28/20 0218 03/29/20 0719 03/30/20 0639  WBC 4.7 6.3 7.9 6.7 7.9  NEUTROABS 3.4 4.0 5.4 4.7 5.0  HGB 9.3* 11.4* 11.5* 10.6* 10.5*  HCT 27.7* 33.5* 33.3* 30.4* 30.3*  MCV 99.6 98.8 97.7 95.9 97.7  PLT 269 307 287 272 263   Cardiac Enzymes: No results for input(s): CKTOTAL, CKMB, CKMBINDEX, TROPONINI in the last 168 hours. BNP (last 3 results) No results for input(s): PROBNP in the last 8760 hours. CBG: Recent Labs  Lab 03/29/20 1148 03/29/20 1702 03/29/20 2034 03/30/20 0731 03/30/20 1126  GLUCAP 84 83 81 78 73   D-Dimer: No results for input(s): DDIMER in the last 72 hours. Hgb A1c: No results for input(s): HGBA1C in the last 72 hours. Lipid Profile: No results for input(s): CHOL, HDL, LDLCALC, TRIG, CHOLHDL, LDLDIRECT in the last 72 hours. Thyroid function studies: No results for  input(s): TSH, T4TOTAL, T3FREE, THYROIDAB in the last 72 hours.  Invalid input(s): FREET3 Anemia work up: Recent Labs    03/29/20 0719  VITAMINB12 922*  FOLATE 7.2  FERRITIN 256  TIBC 174*  IRON 21*   Sepsis Labs: Recent Labs  Lab 03/27/20 0439 03/28/20 0218 03/29/20 0719 03/30/20 0639  WBC 6.3 7.9 6.7 7.9   Microbiology Recent Results (from the past 240 hour(s))  Urine culture     Status: Abnormal   Collection Time: 03/23/20  7:21 PM   Specimen: Urine, Random  Result Value Ref Range Status   Specimen Description URINE, RANDOM  Final   Special Requests   Final    ADDED 2112 Performed at [facility], [address], [place], [place] [address]    Culture (A)  Final    90,000 COLONIES/mL PSEUDOMONAS AERUGINOSA 80,000 COLONIES/mL ENTEROCOCCUS FAECALIS    Report Status 03/25/2020 FINAL  Final   Organism ID, Bacteria PSEUDOMONAS AERUGINOSA (A)  Final   Organism ID, Bacteria ENTEROCOCCUS FAECALIS (A)  Final      Susceptibility   Enterococcus faecalis - MIC*    AMPICILLIN <=2 SENSITIVE Sensitive     NITROFURANTOIN <=16 SENSITIVE Sensitive     VANCOMYCIN 1 SENSITIVE Sensitive     * 80,000 COLONIES/mL ENTEROCOCCUS FAECALIS   Pseudomonas aeruginosa - MIC*    CEFTAZIDIME 4 SENSITIVE Sensitive     CIPROFLOXACIN <=0.25 SENSITIVE Sensitive     GENTAMICIN 2 SENSITIVE Sensitive     IMIPENEM 2 SENSITIVE Sensitive     PIP/TAZO 8 SENSITIVE Sensitive     CEFEPIME 4 SENSITIVE Sensitive     * 90,000 COLONIES/mL PSEUDOMONAS AERUGINOSA  SARS Coronavirus 2 by RT PCR (hospital order, performed in [facility] hospital lab) Nasopharyngeal Nasopharyngeal Swab     Status: None   Collection Time: 03/23/20  9:49 PM   Specimen:  Nasopharyngeal Swab  Result Value Ref Range Status   SARS Coronavirus 2 NEGATIVE NEGATIVE Final    Comment: (NOTE) SARS-CoV-2 target nucleic acids are NOT DETECTED. The SARS-CoV-2 RNA is generally detectable in upper and lower respiratory specimens during  the acute phase of infection. The lowest concentration of SARS-CoV-2 viral copies this assay can detect is 250 copies / mL. A negative result does not preclude SARS-CoV-2 infection and should not be used as the sole basis for treatment or other patient management decisions.  A negative result may occur with improper specimen collection / handling, submission of specimen other than nasopharyngeal swab, presence of viral mutation(s) within the areas targeted by this assay, and inadequate number of viral copies (<250 copies / mL). A negative result must be combined with clinical observations, patient history, and epidemiological information. Fact Sheet for Patients:   BoilerBrush.com.cy Fact Sheet for Healthcare Providers: https://pope.com/ This test is not yet approved or cleared  by the [place] FDA and has been authorized for detection and/or diagnosis of SARS-CoV-2 by FDA under an Emergency Use Authorization (EUA).  This EUA will remain in effect (meaning this test can be used) for the duration of the COVID-19 declaration under Section 564(b)(1) of the Act, 21 U.S.C. section 360bbb-3(b)(1), unless the authorization is terminated or revoked sooner. Performed at [facility], [address], [place], [place] [address]   Culture, blood (routine x 2)     Status: None   Collection Time: 03/24/20  8:40 PM   Specimen: BLOOD  Result Value Ref Range Status   Specimen Description BLOOD RIGHT ANTECUBITAL  Final   Special Requests AEROBIC BOTTLE ONLY Blood Culture adequate volume  Final   Culture   Final    NO GROWTH 5 DAYS Performed at [facility], [address] [place], [place] [address]    Report Status 03/29/2020 FINAL  Final  Culture, blood (routine x 2)     Status: None   Collection Time: 03/24/20  8:51 PM   Specimen: BLOOD  Result Value Ref Range Status   Specimen Description BLOOD LEFT ANTECUBITAL  Final   Special  Requests AEROBIC BOTTLE ONLY Blood Culture adequate volume  Final   Culture   Final    NO GROWTH 5 DAYS Performed at [facility], [address], [place], [place] [address]    Report Status 03/29/2020 FINAL  Final     Medications:   . fentaNYL  1 patch Transdermal Q72H  . fosfomycin  3 g Oral Q72H  .  HYDROmorphone (DILAUDID) injection  1 mg Intravenous NOW  . LORazepam  1 mg Oral Q4H  . potassium chloride  40 mEq Oral Once  . scopolamine  1 patch Transdermal Q72H   Continuous Infusions:    LOS: 7 days   Marinda Elk  [facility]  03/31/2020, 11:30 AM

## 2020-03-31 NOTE — Consult Note (Addendum)
Palliative Care  Consultation Note Reason: Terminal Care  86 yo man from [facility] SNF admitted with fall and head injury, he is bedbound at baseline and was found to have a UTI and related delirium/encephalopathy. He has not responded to conservative medical treatment and is not taking anything by mouth. A decision was made earlier today to focus on comfort and dignity as he approaches the end of his life. He has been extremly agitated and currently has no IV access and difficulty administering SL medications.Given his itching may be morphine /opioid induced so may be reasonable to place a Duragesic patch even though his prognosis is likely to be only days.Will also place a T-Scop patch to reduce his upper airway congestion. He appears to be having intense itching on his torso and extremities.   I discussed hospice facility for his care with family- they want him to be stable enough to move and they [facility] appropriate for transfer. Will re-evaluate him in AM.  While at bedside I inserted a 22g PIV in his right forearm- he was sedated and tolerated my attempt to obtain IV access. Later patient removed the IV-family do not want restart. Will need intensol medication with very low volume as he is high risk for pooling of secretions and meds in his cheek and aspiration. Orders given for IV and Dent hydromorphone and ativan.  Anderson Malta, DO Palliative Medicine   Time: 70 minutes Greater than 50%  of this time was spent counseling and coordinating care related to the above assessment and plan.

## 2020-03-31 NOTE — Plan of Care (Signed)
  Problem: Education: Goal: Knowledge of General Education information will improve Description: Including pain rating scale, medication(s)/side effects and non-pharmacologic comfort measures Outcome: Not Progressing   Problem: Health Behavior/Discharge Planning: Goal: Ability to manage health-related needs will improve Outcome: Not Progressing   

## 2020-04-01 DIAGNOSIS — N3000 Acute cystitis without hematuria: Secondary | ICD-10-CM

## 2020-04-01 MED ORDER — SCOPOLAMINE 1 MG/3DAYS TD PT72: 1.0000 | MEDICATED_PATCH | TRANSDERMAL | 12 refills | Status: AC

## 2020-04-01 MED ORDER — FENTANYL 12 MCG/HR TD PT72: 1.0000 | MEDICATED_PATCH | TRANSDERMAL | 0 refills | Status: AC

## 2020-04-01 MED ORDER — HALOPERIDOL LACTATE 2 MG/ML PO CONC: 1.0000 mg | ORAL | 0 refills | Status: AC | PRN

## 2020-04-01 MED FILL — SCOPOLAMINE 1 MG/3DAYS PT72: 1 | 30 days supply | Qty: 10 | Fill #0

## 2020-04-01 NOTE — Progress Notes (Signed)
Discharged summary packet/ pertinent documents provided to [facility]  Pt remains comfort care status. No s/sx of acute distress noted at this time. Family member at bedside.[facility] bound to [facility] in [place].

## 2020-04-01 NOTE — Discharge Summary (Signed)
Physician Discharge Summary  Jason Vasquez:[id number] DOB: 18-Aug-1929 DOA: 03/23/2020  PCP: Miguel Aschoff, MD  Admit date: 03/23/2020 Discharge date: 04/01/2020  Admitted From: Home Disposition: Residential hospice facility   Recommendations for Outpatient Follow-up:  He will to residential hospice facility.  Home Health:No Quipment/Devices:None  Discharge Condition:Stable CODE STATUS:DNR Diet recommendation: Heart Healthy  Brief/Interim Summary: 86 y.o. male past medical history significant of essential hypertension, diabetes mellitus type 2, sleep apnea dementia who resides in [place] admitted on 03/24/2020 after a fall and hitting her head and later found unresponsive, CT did not show any acute intracranial abnormalities.  She was found to have a UTI started empirically on antibiotics, neurology was consulted who recommended an MRI/MRA of the neck which was unremarkable.  Discharge Diagnoses:  Principal Problem:   Acute metabolic encephalopathy Active Problems:   CKD (chronic kidney disease), stage III   HLD (hyperlipidemia)   DM2 (diabetes mellitus, type 2) (HCC)   Acute lower UTI  Acute metabolic encephalopathy likely possibly secondary to acute urinary tract infection in the setting of dementia: As per family his encephalopathy improved once he was treated with IV fluids and IV antibiotics, RPR B12 and MRI showed no acute findings. EEG showed no source of his seizures. Speech evaluated the patient and recommend the patient to be n.p.o. Palliative was consulted and discussed with family and they would like to move towards comfort care. Patient will be transferred to residential hospice facility.  Pseudomonas/Enterococcus E. coli UTI: He completed his IV treatment of empiric antibiotics.  Chronic kidney disease stage III: Creatinine at baseline.  Anemia of chronic disease: Appears to be stable.  Essential hypertension: Blood pressure at goal all  antihypertensive medications were held on admission.  Dementia: Noted stable without behavioral disturbances.   Discharge Instructions  Discharge Instructions    Diet - low sodium heart healthy   Complete by: As directed    Increase activity slowly   Complete by: As directed      Allergies as of 04/01/2020      Reactions   Ace Inhibitors Cough   Ciprofloxacin Rash   Other reaction(s): RASH   Levofloxacin Rash   Other reaction(s): RASH   Metformin Diarrhea   Other reaction(s): DIARRHEA   Neomy-bacit-polymyx-pramoxine Rash   Patient is allergic to all antibiotics except Doxycycline Patient is allergic to all antibiotics except Doxycycline   Tramadol Nausea Only   Other reaction(s): NAUSEA      Medication List    STOP taking these medications   acetaminophen 650 MG CR tablet Commonly known as: TYLENOL   BIOFREEZE EX   citalopram 10 MG tablet Commonly known as: CELEXA   dilTIAZem CD 240 MG 24 hr capsule Generic drug: diltiazem   enoxaparin 30 MG/0.3ML injection Commonly known as: Lovenox   famotidine 10 MG tablet Commonly known as: PEPCID   FeroSul 325 (65 FE) MG tablet Generic drug: ferrous sulfate   finasteride 5 MG tablet Commonly known as: PROSCAR   QUEtiapine 25 MG tablet Commonly known as: SEROQUEL   telmisartan 80 MG tablet Commonly known as: MICARDIS   tolnaftate 1 % powder Commonly known as: TINACTIN   traZODone 50 MG tablet Commonly known as: DESYREL   Vitamin D3 1.25 MG (50000 UT) Caps     TAKE these medications   fentaNYL 12 MCG/HR Commonly known as: DURAGESIC Place 1 patch onto the skin every 3 (three) days. Start taking on: Apr 03, 2020   haloperidol 2 MG/ML solution Commonly known as:  HALDOL Place 0.5 mLs (1 mg total) under the tongue every 4 (four) hours as needed for agitation.   scopolamine 1 MG/3DAYS Commonly known as: TRANSDERM-SCOP Place 1 patch (1.5 mg total) onto the skin every 3 (three) days. Start taking on: Apr 03, 2020       Allergies  Allergen Reactions  . Ace Inhibitors Cough  . Ciprofloxacin Rash    Other reaction(s): RASH   . Levofloxacin Rash    Other reaction(s): RASH   . Metformin Diarrhea    Other reaction(s): DIARRHEA   . Neomy-Bacit-Polymyx-Pramoxine Rash    Patient is allergic to all antibiotics except Doxycycline Patient is allergic to all antibiotics except Doxycycline   . Tramadol Nausea Only    Other reaction(s): NAUSEA     Consultations:  PMT   Procedures/Studies: EEG  Result Date: 03/24/2020 Charlsie Quest, MD     03/24/2020  9:15 AM Patient Name: Jason Vasquez MRN: [id number] Epilepsy Attending: Charlsie Quest Referring Physician/Provider: Felicie Morn, PA Date: 03/23/2020 Duration: 24.58 minutes Patient history: 86 year old male presented with altered mental status and right-sided weakness.  EEG evaluate for seizures. Level of alertness: Awake, asleep AEDs during EEG study: None Technical aspects: This EEG study was done with scalp electrodes positioned according to the 10-20 International system of electrode placement. Electrical activity was acquired at a sampling rate of 500Hz  and reviewed with a high frequency filter of 70Hz  and a low frequency filter of 1Hz . EEG data were recorded continuously and digitally stored. Description: During awake state, no clear posterior dominant was seen.  Sleep was characterized by vertex waves, sleep spindles (12-14Hz ), maximal frontocentral region.  EEG showed condyle generalized low amplitude 3 to 6 Hz theta delta slowing.  Hyperventilation and photic stimulation were not performed. Single-lead EKG also showed evidence of irregularly irregular heart rhythm. Abnormality -Continuous slow, generalized IMPRESSION: This study is suggestive of moderate diffuse encephalopathy, nonspecific etiology No seizures or epileptiform discharges were seen throughout the recording.   MR BRAIN WO CONTRAST  Result Date:  03/24/2020 CLINICAL DATA:  86 year old male with encephalopathy. History of COVID-19. EXAM: MRI HEAD WITHOUT CONTRAST TECHNIQUE: Multiplanar, multiecho pulse sequences of the brain and surrounding structures were obtained without intravenous contrast. COMPARISON:  Head CT 03/23/2020 and earlier. FINDINGS: The examination had to be discontinued prior to completion due to patient agitation. And only diagnostic coronal DWI imaging could be obtained. No restricted diffusion to suggest acute infarct. Cerebral volume and ventriculomegaly appears stable. No intracranial mass effect is evident. IMPRESSION: Coronal DWI imaging only due to patient agitation - negative for acute infarct. Electronically Signed   By: 05/24/2020 M.D.   On: 03/24/2020 15:56   DG Pelvis Portable  Result Date: 03/23/2020 CLINICAL DATA:  Fall EXAM: PORTABLE PELVIS 1-2 VIEWS COMPARISON:  None. FINDINGS: No acute bony abnormality. Specifically, no fracture, subluxation, or dislocation. Hip joints are symmetric. IMPRESSION: No acute bony abnormality. Electronically Signed   By: Odessa Fleming M.D.   On: 03/23/2020 16:54   DG CHEST PORT 1 VIEW  Result Date: 03/29/2020 CLINICAL DATA:  Shortness of breath EXAM: PORTABLE CHEST 1 VIEW COMPARISON:  Mar 23 2020 FINDINGS: Interstitial prominence and patchy density bilaterally. Probable small pleural effusions. No pneumothorax. Stable cardiomediastinal contours. IMPRESSION: Probable mild pulmonary edema with small pleural effusions. Electronically Signed   By: 05/23/2020 M.D.   On: 03/29/2020 11:47   DG Chest Portable 1 View  Result Date: 03/23/2020 CLINICAL DATA:  Fall EXAM: PORTABLE CHEST  1 VIEW COMPARISON:  None. FINDINGS: Mild cardiomegaly. Interstitial prominence within the lungs could reflect interstitial edema. This is improved since prior study. No effusions or pneumothorax. No acute bony abnormality. IMPRESSION: Diffuse interstitial prominence, likely interstitial edema, improved since prior  study. Electronically Signed   By: Charlett NoseKevin  Dover M.D.   On: 03/23/2020 16:53   DG Shoulder Left Portable  Result Date: 03/23/2020 CLINICAL DATA:  Left shoulder pain. EXAM: LEFT SHOULDER COMPARISON:  None. FINDINGS: There is no evidence of fracture or dislocation. Mild to moderate severity degenerative changes seen involving the left acromioclavicular joint and left acromion. Soft tissues are unremarkable. IMPRESSION: Mild to moderate severity degenerative changes without evidence of acute fracture or dislocation. Electronically Signed   By: Aram Candelahaddeus  Houston M.D.   On: 03/23/2020 21:41   CT HEAD CODE STROKE WO CONTRAST  Result Date: 03/23/2020 CLINICAL DATA:  Code stroke. Ataxia. Right-sided weakness. Altered mental status. EXAM: CT HEAD WITHOUT CONTRAST TECHNIQUE: Contiguous axial images were obtained from the base of the skull through the vertex without intravenous contrast. COMPARISON:  [facility] FINDINGS: Brain: Advanced atrophy and diffuse white matter disease is present. There are new cortical infarct is present. Basal ganglia are stable. Insular cortex is normal. Remote lacunar infarcts are present in the thalami bilaterally. Chronic white matter changes extend into the brainstem. Cerebellum is normal. Vascular: No hyperdense vessel or unexpected calcification. Skull: Calvarium is intact. No focal lytic or blastic lesions are present. No significant extracranial soft tissue lesion is present. Sinuses/Orbits: The paranasal sinuses and mastoid air cells are clear. The globes and orbits are within normal limits. ASPECTS [facility]([place] Stroke Program Early CT Score) - Ganglionic level infarction (caudate, lentiform nuclei, internal capsule, insula, M1-M3 cortex): 7/7 - Supraganglionic infarction (M4-M6 cortex): 3/3 Total score (0-10 with 10 being normal): 10/10 IMPRESSION: 1. No acute intracranial abnormality or significant interval change. 2. Advanced  atrophy and diffuse white matter disease. This likely reflects the sequela of chronic microvascular ischemia. 3. Remote lacunar infarcts of the thalami bilaterally. *ASPECTS is 10/10 * The above was relayed via text pager to Dr. Amada JupiterKirkpatrick on 03/23/2020 at 15:37 . Electronically Signed   By: Marin Robertshristopher  Mattern M.D.   On: 03/23/2020 15:37       Subjective: Nonverbal stable.  Discharge Exam: Vitals:   03/30/20 0732 03/31/20 0300  BP: (!) 146/89 (!) 150/68  Pulse: 87 84  Resp: 17 16  Temp: 98.2 F (36.8 C) 98 F (36.7 C)  SpO2: 99% 99%   Vitals:   03/29/20 2058 03/30/20 0417 03/30/20 0732 03/31/20 0300  BP: (!) 164/61 (!) 164/88 (!) 146/89 (!) 150/68  Pulse: 71 66 87 84  Resp: 18 19 17 16   Temp: 98.5 F (36.9 C) 98.3 F (36.8 C) 98.2 F (36.8 C) 98 F (36.7 C)  TempSrc: Axillary Axillary Axillary Axillary  SpO2:  96% 99% 99%  Weight:      Height:        General: Pt is alert, awake, not in acute distress Cardiovascular: RRR, S1/S2 +, no rubs, no gallops Respiratory: CTA bilaterally, no wheezing, no rhonchi Abdominal: Soft, NT, ND, bowel sounds + Extremities: no edema, no cyanosis    The results of significant diagnostics from this hospitalization (including imaging, microbiology, ancillary and laboratory) are listed below for reference.     Microbiology: Recent Results (from the past 240 hour(s))  Urine culture     Status: Abnormal   Collection Time: 03/23/20  7:21 PM   Specimen: Urine, Random  Result Value Ref Range Status   Specimen Description URINE, RANDOM  Final   Special Requests   Final    ADDED 2112 Performed at [facility], [place] [address], [place], [place] [address]    Culture (A)  Final    90,000 COLONIES/mL PSEUDOMONAS AERUGINOSA 80,000 COLONIES/mL ENTEROCOCCUS FAECALIS    Report Status 03/25/2020 FINAL  Final   Organism ID, Bacteria PSEUDOMONAS AERUGINOSA (A)  Final   Organism ID, Bacteria ENTEROCOCCUS FAECALIS (A)  Final       Susceptibility   Enterococcus faecalis - MIC*    AMPICILLIN <=2 SENSITIVE Sensitive     NITROFURANTOIN <=16 SENSITIVE Sensitive     VANCOMYCIN 1 SENSITIVE Sensitive     * 80,000 COLONIES/mL ENTEROCOCCUS FAECALIS   Pseudomonas aeruginosa - MIC*    CEFTAZIDIME 4 SENSITIVE Sensitive     CIPROFLOXACIN <=0.25 SENSITIVE Sensitive     GENTAMICIN 2 SENSITIVE Sensitive     IMIPENEM 2 SENSITIVE Sensitive     PIP/TAZO 8 SENSITIVE Sensitive     CEFEPIME 4 SENSITIVE Sensitive     * 90,000 COLONIES/mL PSEUDOMONAS AERUGINOSA  SARS Coronavirus 2 by RT PCR (hospital order, performed in [place] hospital lab) Nasopharyngeal Nasopharyngeal Swab     Status: None   Collection Time: 03/23/20  9:49 PM   Specimen: Nasopharyngeal Swab  Result Value Ref Range Status   SARS Coronavirus 2 NEGATIVE NEGATIVE Final    Comment: (NOTE) SARS-CoV-2 target nucleic acids are NOT DETECTED. The SARS-CoV-2 RNA is generally detectable in upper and lower respiratory specimens during the acute phase of infection. The lowest concentration of SARS-CoV-2 viral copies this assay can detect is 250 copies / mL. A negative result does not preclude SARS-CoV-2 infection and should not be used as the sole basis for treatment or other patient management decisions.  A negative result may occur with improper specimen collection / handling, submission of specimen other than nasopharyngeal swab, presence of viral mutation(s) within the areas targeted by this assay, and inadequate number of viral copies (<250 copies / mL). A negative result must be combined with clinical observations, patient history, and epidemiological information. Fact Sheet for Patients:   StrictlyIdeas.no Fact Sheet for Healthcare Providers: BankingDealers.co.za This test is not yet approved or cleared  by the [place] FDA and has been authorized for detection and/or diagnosis of SARS-CoV-2 by FDA under an  Emergency Use Authorization (EUA).  This EUA will remain in effect (meaning this test can be used) for the duration of the COVID-19 declaration under Section 564(b)(1) of the Act, 21 U.S.C. section 360bbb-3(b)(1), unless the authorization is terminated or revoked sooner. Performed at [facility], [place] [address], [place], [place] [address]   Culture, blood (routine x 2)     Status: None   Collection Time: 03/24/20  8:40 PM   Specimen: BLOOD  Result Value Ref Range Status   Specimen Description BLOOD RIGHT ANTECUBITAL  Final   Special Requests AEROBIC BOTTLE ONLY Blood Culture adequate volume  Final   Culture   Final    NO GROWTH 5 DAYS Performed at [facility], [place] [address] [place], [place] [address]    Report Status 03/29/2020 FINAL  Final  Culture, blood (routine x 2)     Status: None   Collection Time: 03/24/20  8:51 PM   Specimen: BLOOD  Result Value Ref Range Status   Specimen Description BLOOD LEFT ANTECUBITAL  Final  Special Requests AEROBIC BOTTLE ONLY Blood Culture adequate volume  Final   Culture   Final    NO GROWTH 5 DAYS Performed at [facility], [address] [place], [place] [address]    Report Status 03/29/2020 FINAL  Final     Labs: BNP (last 3 results) No results for input(s): BNP in the last 8760 hours. Basic Metabolic Panel: Recent Labs  Lab 03/26/20 0746 03/27/20 0439 03/28/20 0218 03/29/20 0719 03/30/20 0639  NA 137 139 138 140 141  K 3.7 4.3 3.7 3.4* 3.6  CL 105 103 102 105 107  CO2 23 24 22 25 24   GLUCOSE 117* 93 88 93 75  BUN 14 13 20 23  25*  CREATININE 1.39* 1.39* 1.42* 1.35* 1.45*  CALCIUM 8.7* 9.3 9.0 9.0 8.8*   Liver Function Tests: No results for input(s): AST, ALT, ALKPHOS, BILITOT, PROT, ALBUMIN in the last 168 hours. No results for input(s): LIPASE, AMYLASE in the last 168 hours. Recent Labs  Lab 03/29/20 0719  AMMONIA 22   CBC: Recent Labs  Lab 03/26/20 0746 03/27/20 0439 03/28/20 0218  03/29/20 0719 03/30/20 0639  WBC 4.7 6.3 7.9 6.7 7.9  NEUTROABS 3.4 4.0 5.4 4.7 5.0  HGB 9.3* 11.4* 11.5* 10.6* 10.5*  HCT 27.7* 33.5* 33.3* 30.4* 30.3*  MCV 99.6 98.8 97.7 95.9 97.7  PLT 269 307 287 272 263   Cardiac Enzymes: No results for input(s): CKTOTAL, CKMB, CKMBINDEX, TROPONINI in the last 168 hours. BNP: Invalid input(s): POCBNP CBG: Recent Labs  Lab 03/29/20 1148 03/29/20 1702 03/29/20 2034 03/30/20 0731 03/30/20 1126  GLUCAP 84 83 81 78 73   D-Dimer No results for input(s): DDIMER in the last 72 hours. Hgb A1c No results for input(s): HGBA1C in the last 72 hours. Lipid Profile No results for input(s): CHOL, HDL, LDLCALC, TRIG, CHOLHDL, LDLDIRECT in the last 72 hours. Thyroid function studies No results for input(s): TSH, T4TOTAL, T3FREE, THYROIDAB in the last 72 hours.  Invalid input(s): FREET3 Anemia work up No results for input(s): VITAMINB12, FOLATE, FERRITIN, TIBC, IRON, RETICCTPCT in the last 72 hours. Urinalysis    Component Value Date/Time   COLORURINE YELLOW 03/23/2020 1900   APPEARANCEUR CLEAR 03/23/2020 1900   LABSPEC 1.012 03/23/2020 1900   PHURINE 5.0 03/23/2020 1900   GLUCOSEU NEGATIVE 03/23/2020 1900   HGBUR NEGATIVE 03/23/2020 1900   BILIRUBINUR NEGATIVE 03/23/2020 1900   KETONESUR NEGATIVE 03/23/2020 1900   PROTEINUR NEGATIVE 03/23/2020 1900   NITRITE NEGATIVE 03/23/2020 1900   LEUKOCYTESUR MODERATE (A) 03/23/2020 1900   Sepsis Labs Invalid input(s): PROCALCITONIN,  WBC,  LACTICIDVEN Microbiology Recent Results (from the past 240 hour(s))  Urine culture     Status: Abnormal   Collection Time: 03/23/20  7:21 PM   Specimen: Urine, Random  Result Value Ref Range Status   Specimen Description URINE, RANDOM  Final   Special Requests   Final    ADDED 2112 Performed at [facility], [address] [place], [address] [place]    Culture (A)  Final    90,000 COLONIES/mL PSEUDOMONAS AERUGINOSA 80,000 COLONIES/mL ENTEROCOCCUS  FAECALIS    Report Status 03/25/2020 FINAL  Final   Organism ID, Bacteria PSEUDOMONAS AERUGINOSA (A)  Final   Organism ID, Bacteria ENTEROCOCCUS FAECALIS (A)  Final      Susceptibility   Enterococcus faecalis - MIC*    AMPICILLIN <=2 SENSITIVE Sensitive     NITROFURANTOIN <=16 SENSITIVE Sensitive     VANCOMYCIN 1 SENSITIVE Sensitive     *  80,000 COLONIES/mL ENTEROCOCCUS FAECALIS   Pseudomonas aeruginosa - MIC*    CEFTAZIDIME 4 SENSITIVE Sensitive     CIPROFLOXACIN <=0.25 SENSITIVE Sensitive     GENTAMICIN 2 SENSITIVE Sensitive     IMIPENEM 2 SENSITIVE Sensitive     PIP/TAZO 8 SENSITIVE Sensitive     CEFEPIME 4 SENSITIVE Sensitive     * 90,000 COLONIES/mL PSEUDOMONAS AERUGINOSA  SARS Coronavirus 2 by RT PCR (hospital order, performed in [facility] hospital lab) Nasopharyngeal Nasopharyngeal Swab     Status: None   Collection Time: 03/23/20  9:49 PM   Specimen: Nasopharyngeal Swab  Result Value Ref Range Status   SARS Coronavirus 2 NEGATIVE NEGATIVE Final    Comment: (NOTE) SARS-CoV-2 target nucleic acids are NOT DETECTED. The SARS-CoV-2 RNA is generally detectable in upper and lower respiratory specimens during the acute phase of infection. The lowest concentration of SARS-CoV-2 viral copies this assay can detect is 250 copies / mL. A negative result does not preclude SARS-CoV-2 infection and should not be used as the sole basis for treatment or other patient management decisions.  A negative result may occur with improper specimen collection / handling, submission of specimen other than nasopharyngeal swab, presence of viral mutation(s) within the areas targeted by this assay, and inadequate number of viral copies (<250 copies / mL). A negative result must be combined with clinical observations, patient history, and epidemiological information. Fact Sheet for Patients:   BoilerBrush.com.cy Fact Sheet for Healthcare  Providers: https://pope.com/ This test is not yet approved or cleared  by the [place] FDA and has been authorized for detection and/or diagnosis of SARS-CoV-2 by FDA under an Emergency Use Authorization (EUA).  This EUA will remain in effect (meaning this test can be used) for the duration of the COVID-19 declaration under Section 564(b)(1) of the Act, 21 U.S.C. section 360bbb-3(b)(1), unless the authorization is terminated or revoked sooner. Performed at [facility], [address], [place], [place] [address]   Culture, blood (routine x 2)     Status: None   Collection Time: 03/24/20  8:40 PM   Specimen: BLOOD  Result Value Ref Range Status   Specimen Description BLOOD RIGHT ANTECUBITAL  Final   Special Requests AEROBIC BOTTLE ONLY Blood Culture adequate volume  Final   Culture   Final    NO GROWTH 5 DAYS Performed at [facility], [address] [place], [place] [address]    Report Status 03/29/2020 FINAL  Final  Culture, blood (routine x 2)     Status: None   Collection Time: 03/24/20  8:51 PM   Specimen: BLOOD  Result Value Ref Range Status   Specimen Description BLOOD LEFT ANTECUBITAL  Final   Special Requests AEROBIC BOTTLE ONLY Blood Culture adequate volume  Final   Culture   Final    NO GROWTH 5 DAYS Performed at [facility], [address], [place], [place] [address]    Report Status 03/29/2020 FINAL  Final     Time coordinating discharge: Over 30 minutes  SIGNED:   Marinda Elk, MD  [facility] 04/01/2020, 9:16 AM Pager   If 7PM-7AM, please contact night-coverage [email or web address] Password [email or web address]

## 2020-04-01 NOTE — Progress Notes (Signed)
[facility]:  [employer] to the pt's daughters Jason Vasquez, Jason Vasquez, Jason Vasquez and Jason Vasquez via conference call and discussed hospice care at [facility]. They are all in agreement with comfort care which has already been started at the hospital.   Spoke to our MD and he has been approved for transfer and admit to [facility].   Spoke to Cameron CM at [facility] and she will be working toward updating the team and help with transition. Norm Parcel RN [phone]

## 2020-04-01 NOTE — TOC Transition Note (Addendum)
Transition of Care [facility]) - CM/SW Discharge Note   Patient Details  Name: Jason Vasquez MRN: [id number] Date of Birth: Jul 23, 1929  [facility] Care [facility]) CM/SW Contact:  Epifanio Lesches, RN Phone Number: [phone] 04/01/2020, 9:53 AM   Clinical Narrative:    [facility] will [facility] to: [facility] [place] Anticipated DC date: 04/01/2020 Family notified: Taleda (granddaughter) Transport by: [facility]  Pt will transition to [facility]. Per MD patient ready for DC today . RN, patient, patient's family, and facility aware of d/c plan. Discharge Summary received by admission liaison. RN to call report prior to discharge ( [phone]) . DC packet on chart. Ambulance transport requested for patient.   RNCM will sign off for now as intervention is no longer needed. Please consult [place] again if new needs arise.      Barriers to Discharge: No Barriers Identified   Patient Goals and CMS Choice   CMS Medicare.gov Compare Post Acute Care list provided to:: Patient Represenative (must comment)    Discharge Placement                       Discharge Plan and Services     Post Acute Care Choice: Skilled Nursing Facility                               Social Determinants of Health (SDOH) Interventions     Readmission Risk Interventions No flowsheet data found.

## 2020-04-01 NOTE — Progress Notes (Signed)
Palliative Care Progress Note  Mr. Ku is actively dying, comfort measures only. Family is at bedside, He continues to be very agitated from what appears to be intense itching. He has no IV access. Difficulty with SL medications. Yesterday I placed a duragesic and T-scop patch for his comfort. Family requesting we hold Ativan and they think that is causing the itching- I switched this to valium SL. He needs a SQ infusion but unable to do this outside of a Hospice IPU. I discussed hospice facility with family as best location for his symptom management and EOL care- they are interested in [facility]. I have contacted liaison and made her aware of the referral. She will reach out to family and assist with this transition.  Orders for IM benedryl prn for itching placed and comfort care plan discussed with RN.  Anderson Malta, DO Palliative Medicine  Time:35 min Greater than 50%  of this time was spent counseling and coordinating care related to the above assessment and plan.

## 2020-04-01 NOTE — Plan of Care (Signed)
  Problem: Pain Managment: Goal: General experience of comfort will improve Outcome: Progressing   Problem: Education: Goal: Knowledge of General Education information will improve Description: Including pain rating scale, medication(s)/side effects and non-pharmacologic comfort measures Outcome: Not Progressing   Problem: Health Behavior/Discharge Planning: Goal: Ability to manage health-related needs will improve Outcome: Not Progressing   

## 2020-04-01 NOTE — Progress Notes (Signed)
Report given to [facility] in [place].

## 2020-04-01 NOTE — Progress Notes (Signed)
Nutrition Brief Note  Chart reviewed. Pt now transitioning to comfort care.  No further nutrition interventions warranted at this time.  Please re-consult as needed.   Marva Hendryx W, RD, LDN, CDCES Registered Dietitian II Certified Diabetes Care and Education Specialist Please refer to [facility] for RD and/or RD on-call/weekend/after hours pager  

## 2020-04-01 NOTE — Plan of Care (Signed)
  Problem: Pain Managment: Goal: General experience of comfort will improve Outcome: Progressing   Problem: Skin Integrity: Goal: Risk for impaired skin integrity will decrease Outcome: Progressing   Problem: Education: Goal: Knowledge of General Education information will improve Description: Including pain rating scale, medication(s)/side effects and non-pharmacologic comfort measures Outcome: Not Progressing   Problem: Activity: Goal: Risk for activity intolerance will decrease Outcome: Not Progressing   Problem: Safety: Goal: Ability to remain free from injury will improve Outcome: Not Progressing

## 2020-12-16 IMAGING — DX DG FOOT COMPLETE 3+V*R*
3 series · 3 of 3 positions shown · non-contrast
Comparison: None.

CLINICAL DATA: Persistent pain, swelling across the metatarsals

EXAM:
RIGHT FOOT COMPLETE - 3+ VIEW

[dg foot complete right (1 of 3)]
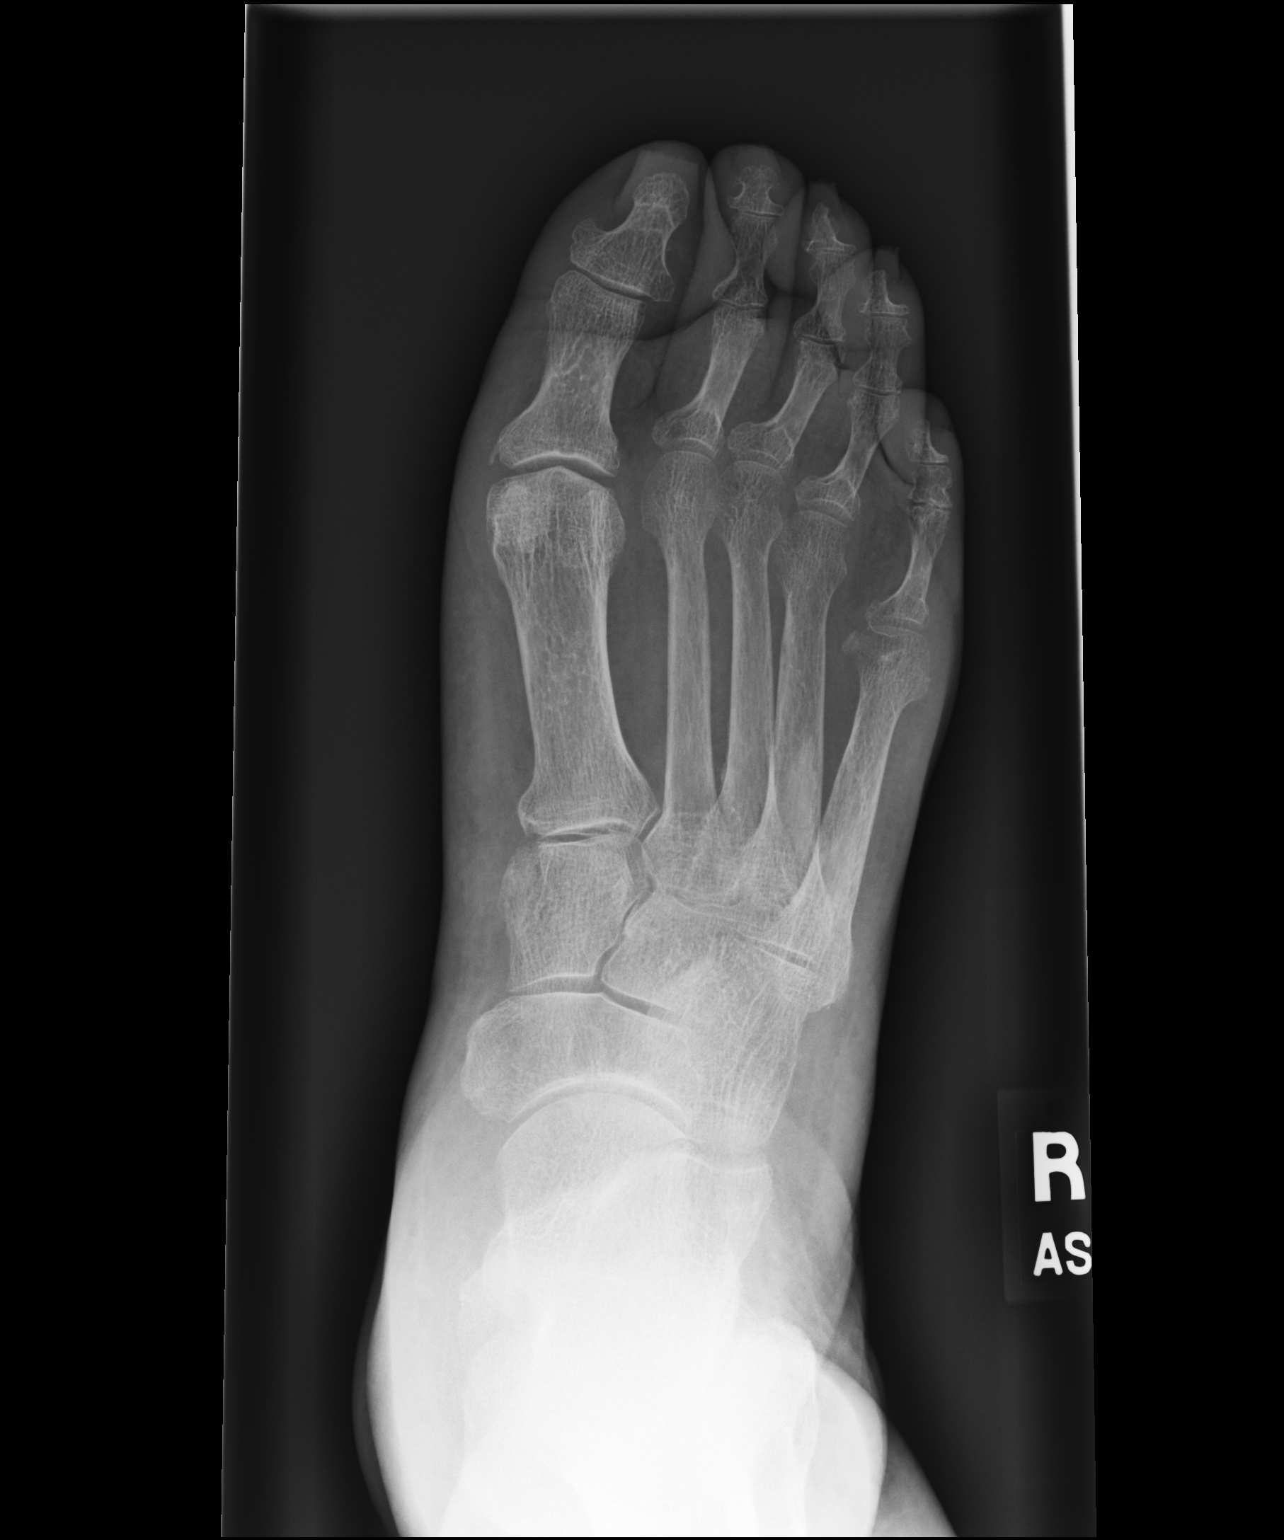

[dg foot complete right (2 of 3)]
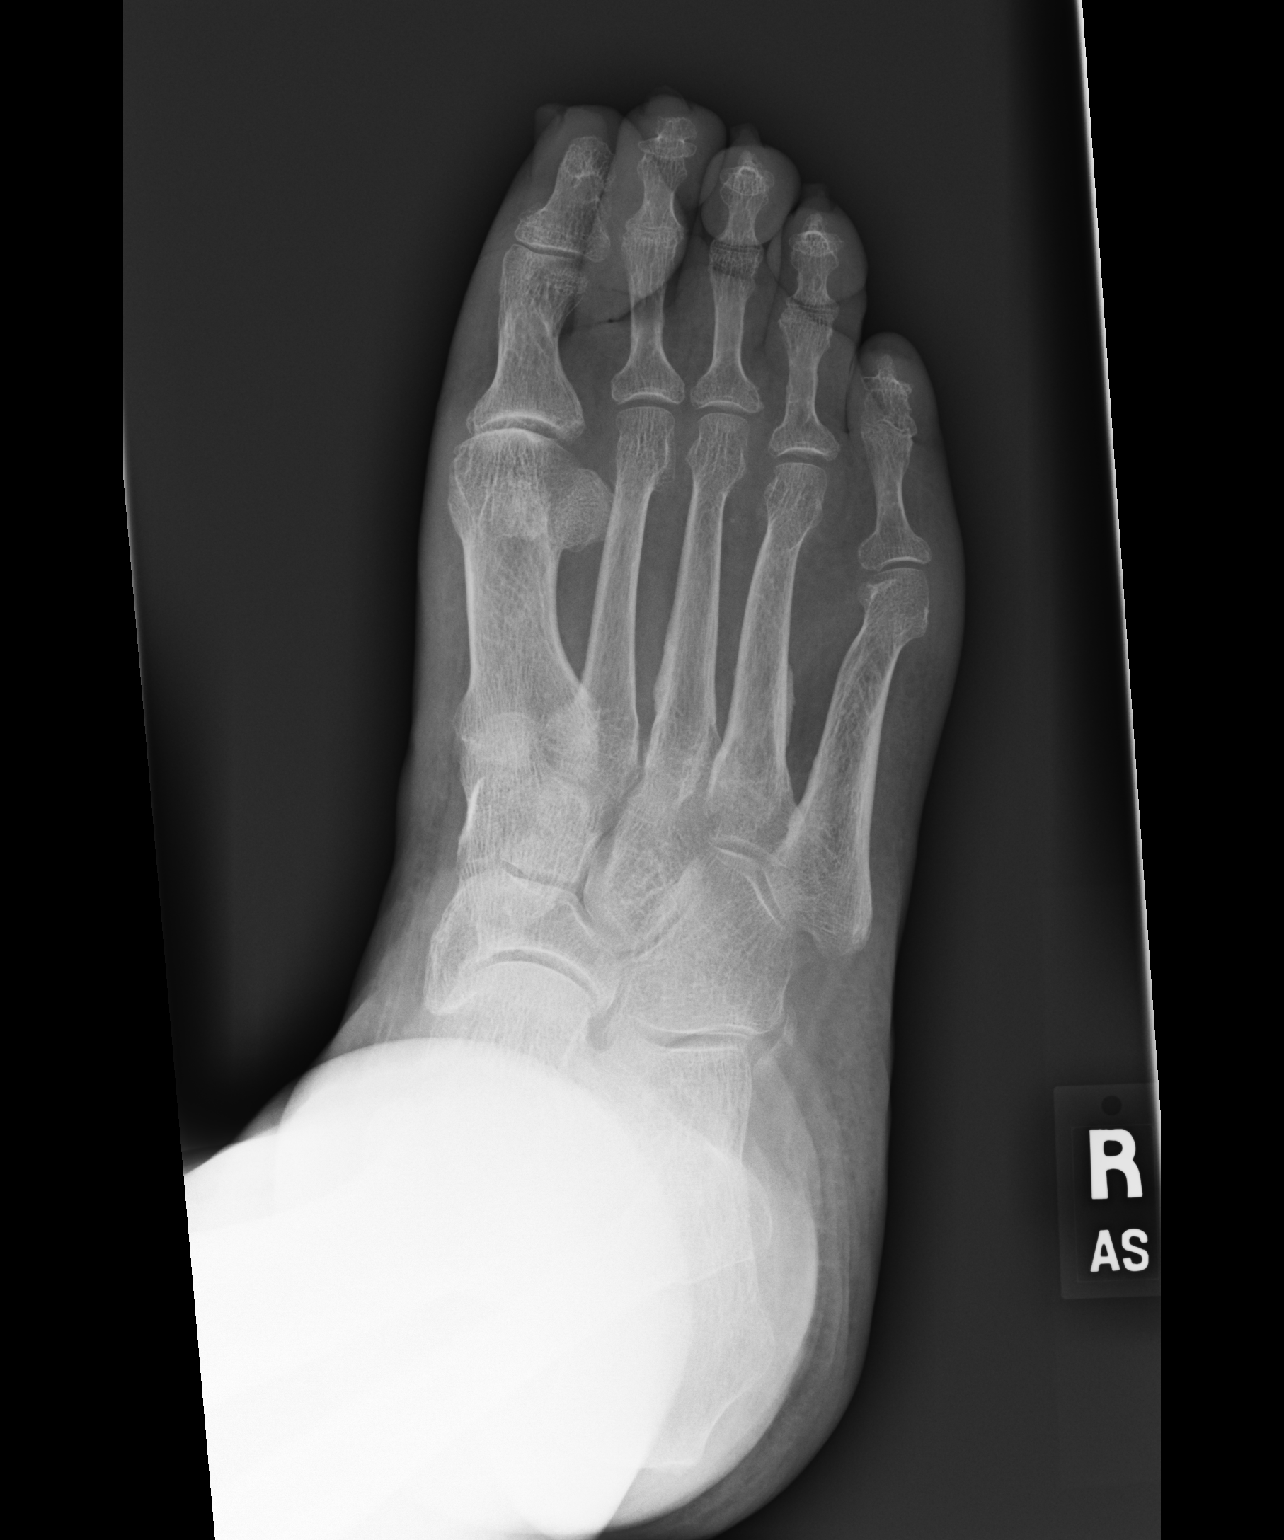

[dg foot complete right (3 of 3)]
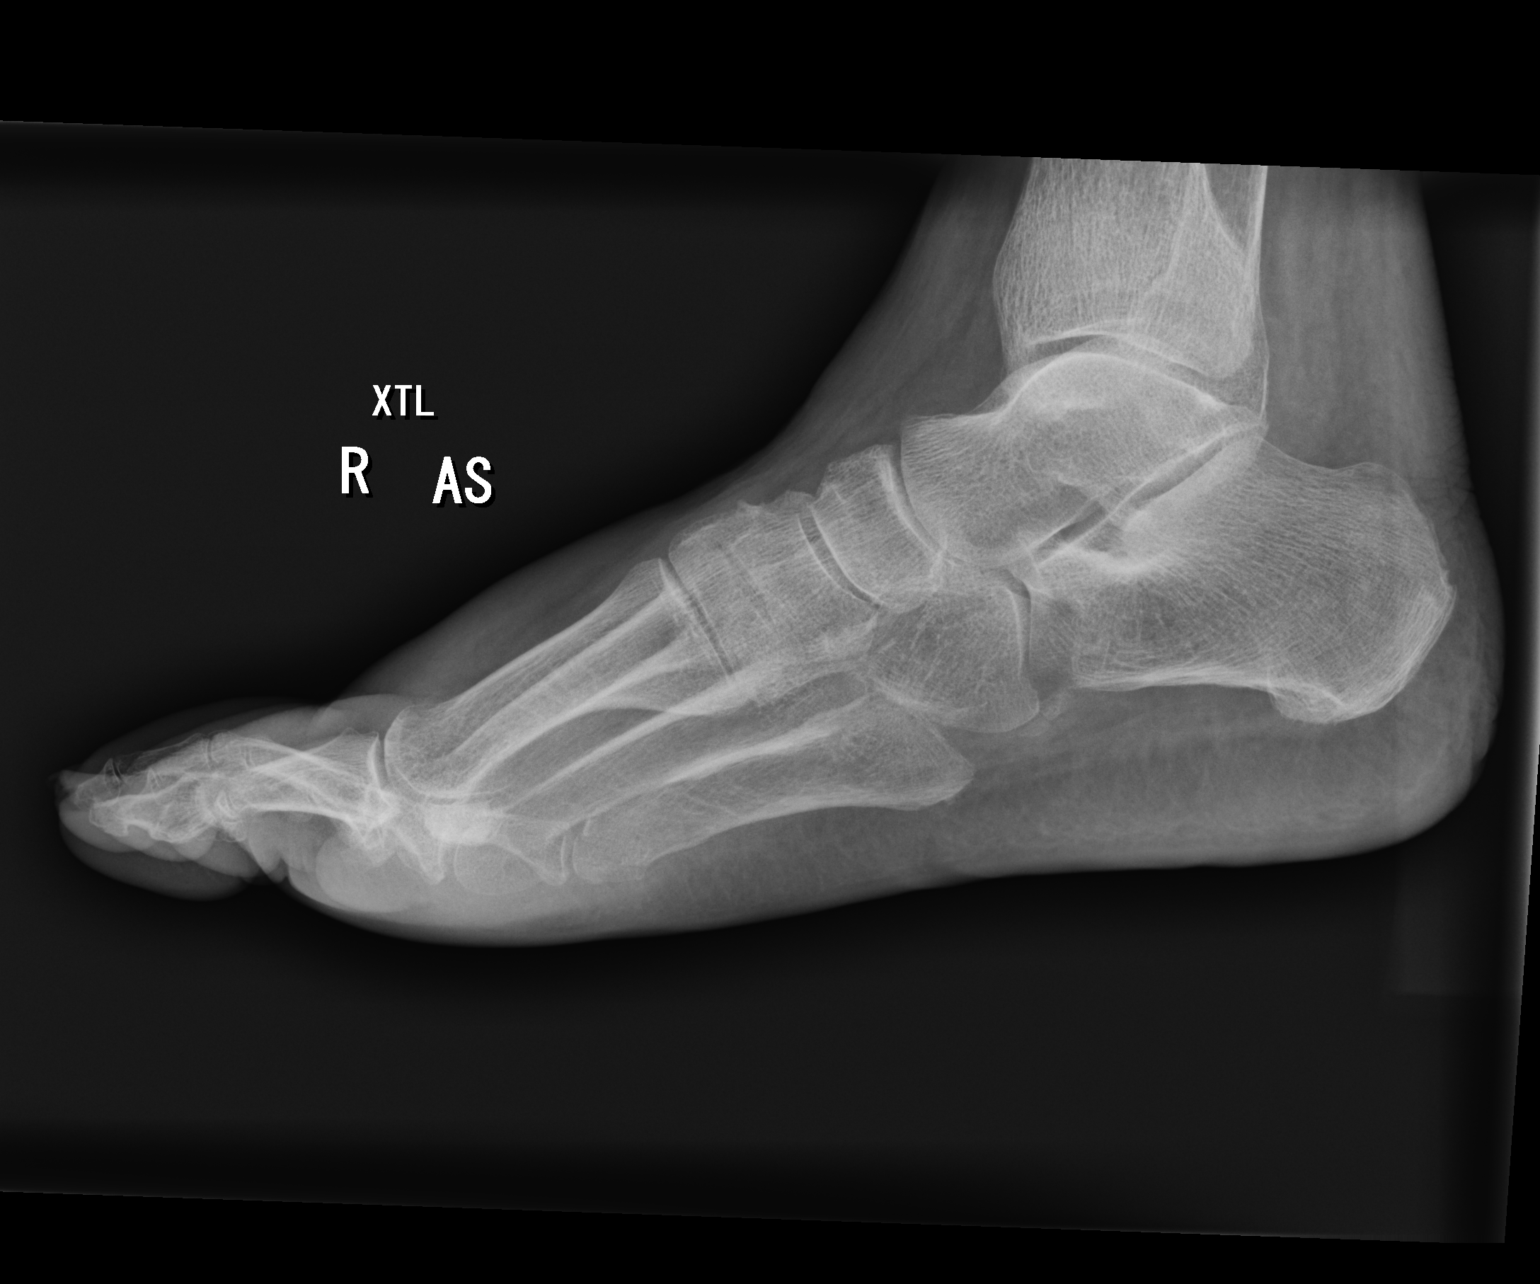

[3 of 3 positions shown; findings below may reference images not displayed]

FINDINGS: No fracture or malalignment. Mild degenerative change at the first
MTP joint. Dorsal soft tissue swelling
IMPRESSION: No acute osseous abnormality

## 2020-12-17 IMAGING — DX DG KNEE COMPLETE 4+V*R*
5 series · 5 of 5 positions shown · non-contrast
Comparison: 09/15/2019

CLINICAL DATA: Acute RIGHT knee pain following fall today. Initial
encounter.

EXAM:
RIGHT KNEE - COMPLETE 4+ VIEW

[knee lat]
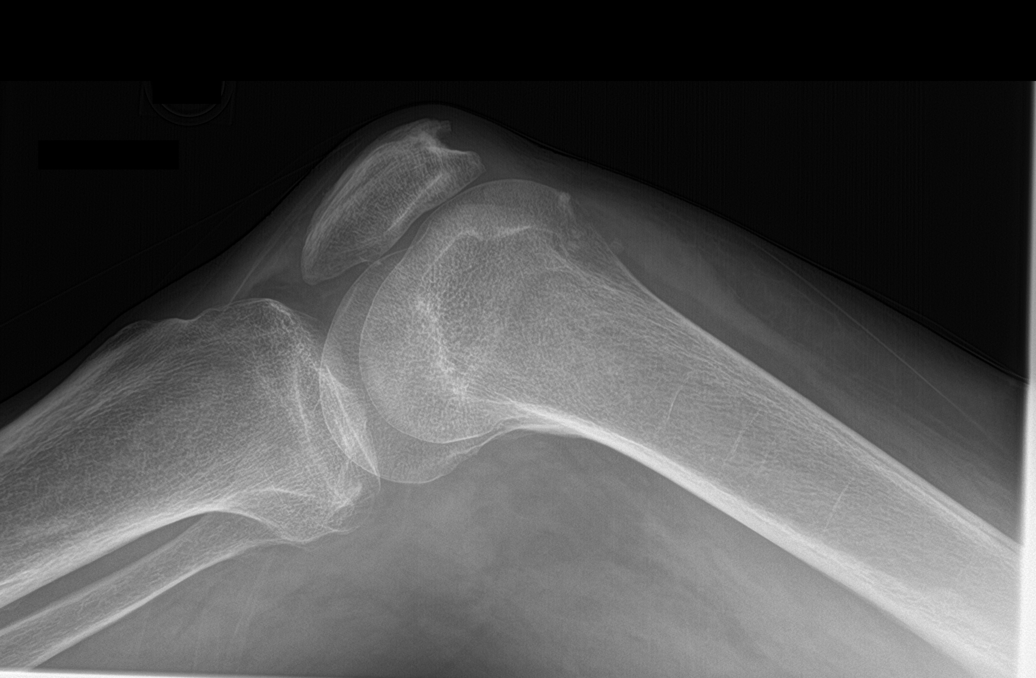

[knee obl (1 of 2)]
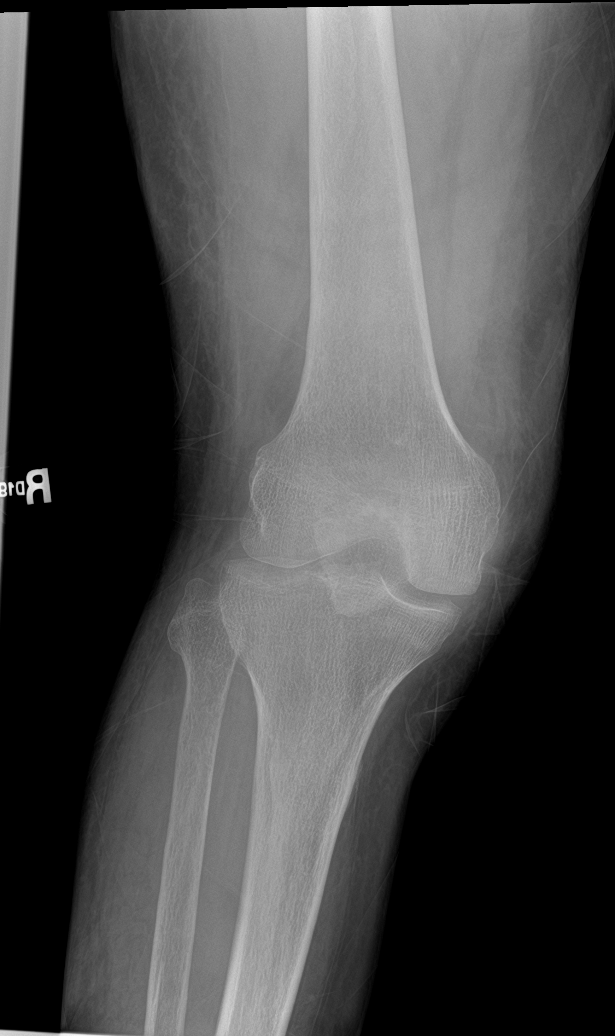

[knee obl (2 of 2)]
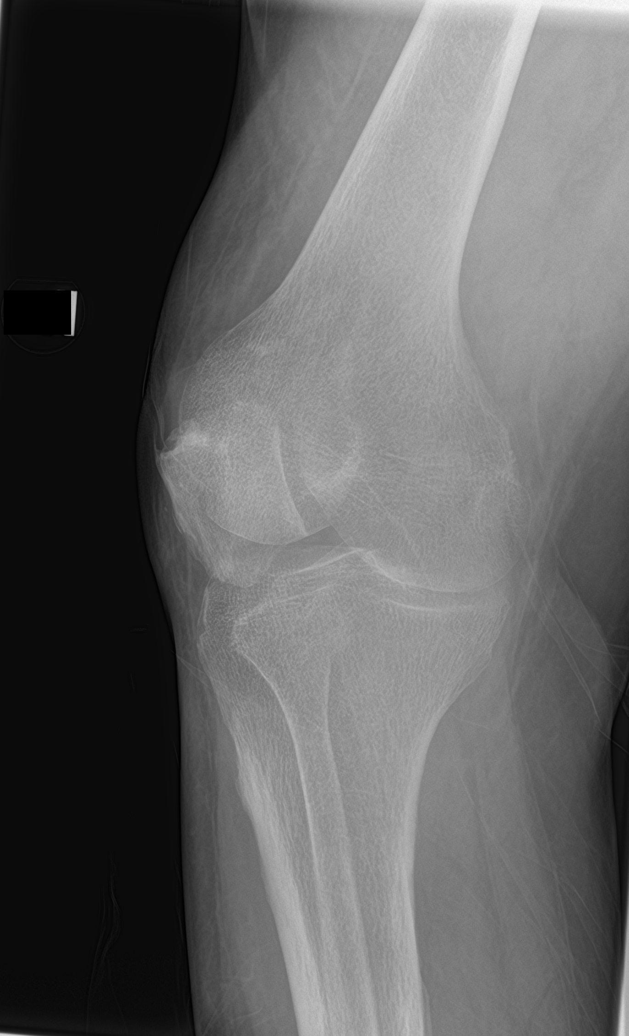

[knee sunrise]
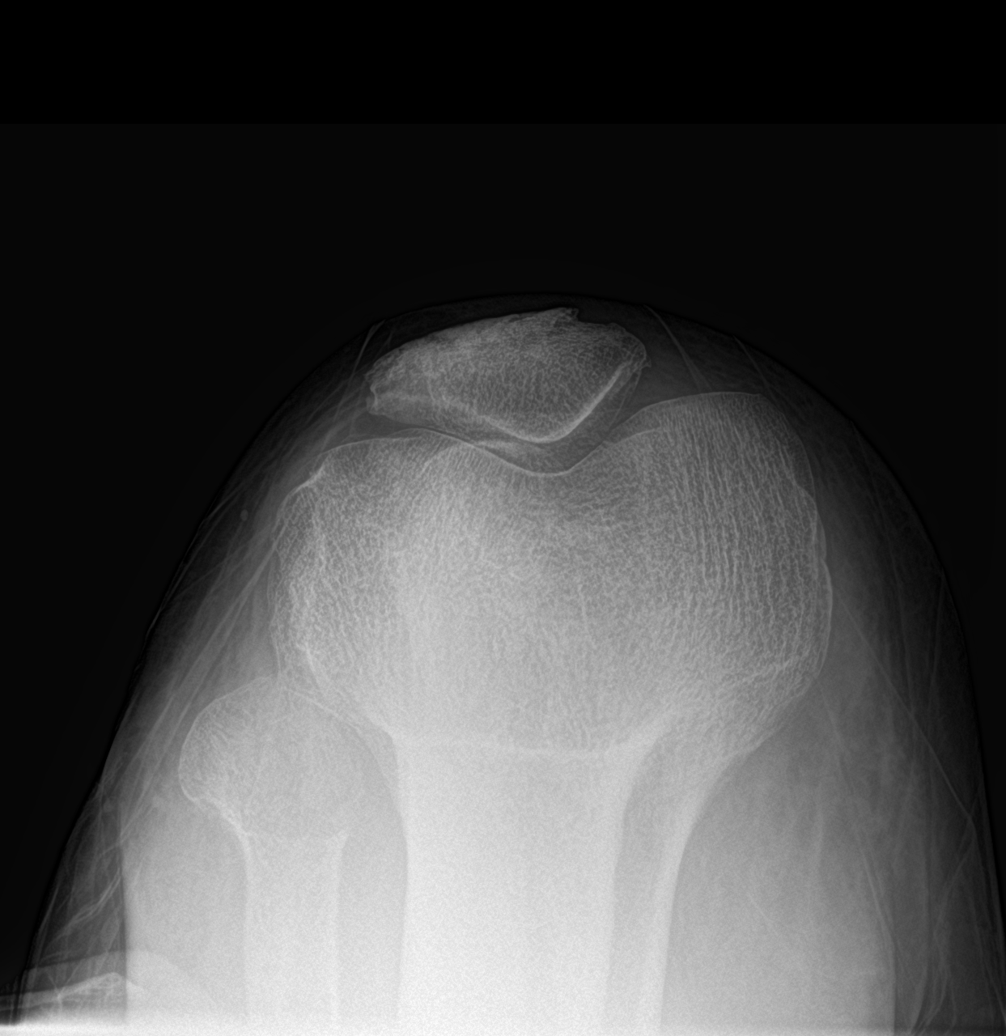

[knee ap]
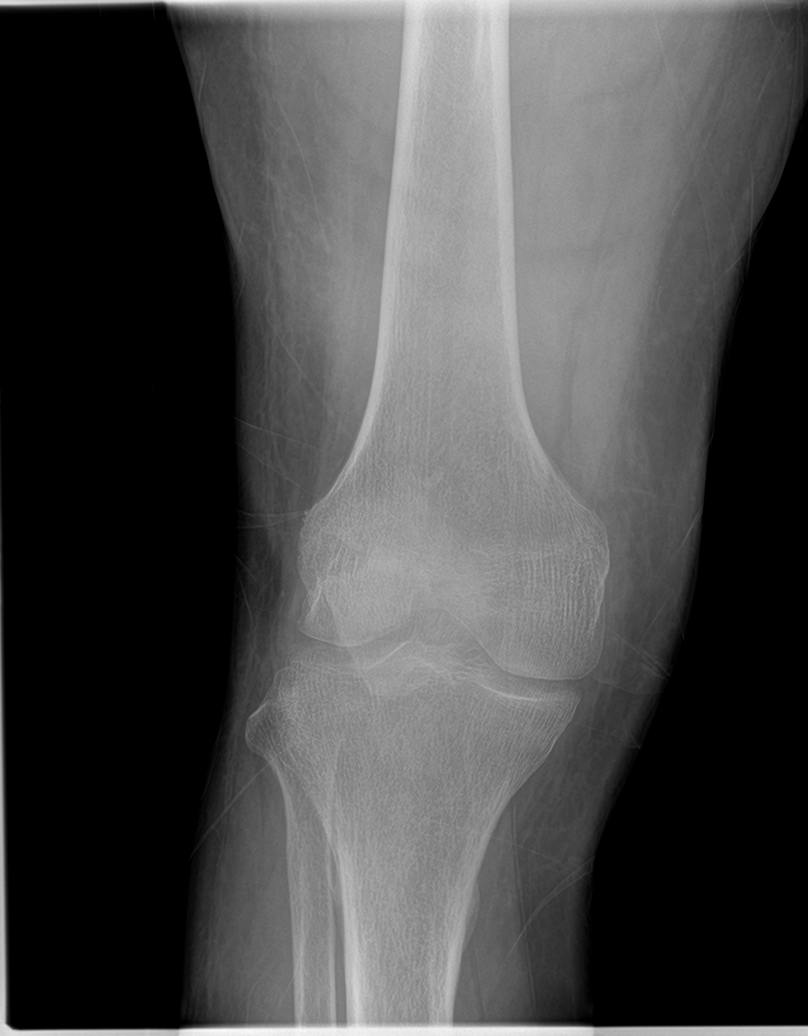

[5 of 5 positions shown; findings below may reference images not displayed]

FINDINGS: No acute fracture, subluxation or dislocation.

No definite knee effusion is noted.

Probable soft tissue swelling noted.

Mild tricompartmental joint space narrowing noted.
IMPRESSION: Probable soft tissue swelling without acute bony abnormality.
